# Patient Record
Sex: Female | Born: 1940 | Race: Black or African American | Hispanic: No | Marital: Married | Smoking: Never smoker
Health system: Southern US, Community
[De-identification: ages and names within clinical notes are randomized; demographics above are authoritative.]

## PROBLEM LIST (undated history)

## (undated) DIAGNOSIS — E7800 Pure hypercholesterolemia, unspecified: Secondary | ICD-10-CM

## (undated) DIAGNOSIS — K219 Gastro-esophageal reflux disease without esophagitis: Secondary | ICD-10-CM

## (undated) DIAGNOSIS — M1990 Unspecified osteoarthritis, unspecified site: Secondary | ICD-10-CM

## (undated) DIAGNOSIS — H409 Unspecified glaucoma: Secondary | ICD-10-CM

## (undated) DIAGNOSIS — I10 Essential (primary) hypertension: Secondary | ICD-10-CM

## (undated) DIAGNOSIS — C50919 Malignant neoplasm of unspecified site of unspecified female breast: Secondary | ICD-10-CM

## (undated) HISTORY — PX: CHOLECYSTECTOMY: SHX55

## (undated) HISTORY — PX: ABDOMINAL HYSTERECTOMY: SHX81

---

## 1972-10-31 HISTORY — PX: TUBAL LIGATION: SHX77

## 1998-02-11 ENCOUNTER — Ambulatory Visit (HOSPITAL_COMMUNITY): Admission: RE | Admit: 1998-02-11 | Discharge: 1998-02-11 | Payer: Self-pay | Admitting: *Deleted

## 1998-03-19 ENCOUNTER — Inpatient Hospital Stay (HOSPITAL_COMMUNITY): Admission: EM | Admit: 1998-03-19 | Discharge: 1998-03-22 | Payer: Self-pay | Admitting: Emergency Medicine

## 1998-04-24 ENCOUNTER — Ambulatory Visit: Admission: RE | Admit: 1998-04-24 | Discharge: 1998-04-24 | Payer: Self-pay | Admitting: *Deleted

## 1998-06-10 ENCOUNTER — Other Ambulatory Visit: Admission: RE | Admit: 1998-06-10 | Discharge: 1998-06-10 | Payer: Self-pay | Admitting: Gynecology

## 1998-06-24 ENCOUNTER — Other Ambulatory Visit: Admission: RE | Admit: 1998-06-24 | Discharge: 1998-06-24 | Payer: Self-pay | Admitting: Gynecology

## 1998-09-15 ENCOUNTER — Ambulatory Visit (HOSPITAL_COMMUNITY): Admission: RE | Admit: 1998-09-15 | Discharge: 1998-09-15 | Payer: Self-pay | Admitting: *Deleted

## 1999-01-12 ENCOUNTER — Ambulatory Visit (HOSPITAL_COMMUNITY): Admission: RE | Admit: 1999-01-12 | Discharge: 1999-01-12 | Payer: Self-pay | Admitting: *Deleted

## 1999-01-12 ENCOUNTER — Encounter: Payer: Self-pay | Admitting: *Deleted

## 1999-05-03 ENCOUNTER — Ambulatory Visit (HOSPITAL_COMMUNITY): Admission: RE | Admit: 1999-05-03 | Discharge: 1999-05-03 | Payer: Self-pay | Admitting: *Deleted

## 1999-05-03 ENCOUNTER — Encounter: Payer: Self-pay | Admitting: *Deleted

## 1999-05-21 ENCOUNTER — Other Ambulatory Visit: Admission: RE | Admit: 1999-05-21 | Discharge: 1999-05-21 | Payer: Self-pay | Admitting: Gynecology

## 1999-05-21 ENCOUNTER — Encounter (INDEPENDENT_AMBULATORY_CARE_PROVIDER_SITE_OTHER): Payer: Self-pay | Admitting: Specialist

## 1999-07-06 ENCOUNTER — Other Ambulatory Visit: Admission: RE | Admit: 1999-07-06 | Discharge: 1999-07-06 | Payer: Self-pay | Admitting: Gynecology

## 1999-07-13 ENCOUNTER — Inpatient Hospital Stay (HOSPITAL_COMMUNITY): Admission: RE | Admit: 1999-07-13 | Discharge: 1999-07-14 | Payer: Self-pay | Admitting: Gynecology

## 2000-05-09 ENCOUNTER — Encounter: Payer: Self-pay | Admitting: *Deleted

## 2000-05-09 ENCOUNTER — Ambulatory Visit (HOSPITAL_COMMUNITY): Admission: RE | Admit: 2000-05-09 | Discharge: 2000-05-09 | Payer: Self-pay | Admitting: *Deleted

## 2000-07-07 ENCOUNTER — Ambulatory Visit (HOSPITAL_COMMUNITY): Admission: RE | Admit: 2000-07-07 | Discharge: 2000-07-07 | Payer: Self-pay | Admitting: *Deleted

## 2000-11-16 ENCOUNTER — Encounter: Payer: Self-pay | Admitting: *Deleted

## 2000-11-16 ENCOUNTER — Inpatient Hospital Stay (HOSPITAL_COMMUNITY): Admission: AD | Admit: 2000-11-16 | Discharge: 2000-11-19 | Payer: Self-pay | Admitting: *Deleted

## 2000-11-18 ENCOUNTER — Encounter: Payer: Self-pay | Admitting: *Deleted

## 2000-11-30 ENCOUNTER — Emergency Department (HOSPITAL_COMMUNITY): Admission: EM | Admit: 2000-11-30 | Discharge: 2000-11-30 | Payer: Self-pay

## 2000-12-12 ENCOUNTER — Ambulatory Visit (HOSPITAL_COMMUNITY): Admission: RE | Admit: 2000-12-12 | Discharge: 2000-12-12 | Payer: Self-pay | Admitting: Cardiology

## 2001-03-12 ENCOUNTER — Encounter: Payer: Self-pay | Admitting: Emergency Medicine

## 2001-03-12 ENCOUNTER — Emergency Department (HOSPITAL_COMMUNITY): Admission: EM | Admit: 2001-03-12 | Discharge: 2001-03-12 | Payer: Self-pay | Admitting: Emergency Medicine

## 2001-03-19 ENCOUNTER — Ambulatory Visit (HOSPITAL_COMMUNITY): Admission: RE | Admit: 2001-03-19 | Discharge: 2001-03-19 | Payer: Self-pay | Admitting: *Deleted

## 2001-03-19 ENCOUNTER — Encounter (INDEPENDENT_AMBULATORY_CARE_PROVIDER_SITE_OTHER): Payer: Self-pay | Admitting: Specialist

## 2001-05-10 ENCOUNTER — Encounter: Payer: Self-pay | Admitting: *Deleted

## 2001-05-10 ENCOUNTER — Ambulatory Visit (HOSPITAL_COMMUNITY): Admission: RE | Admit: 2001-05-10 | Discharge: 2001-05-10 | Payer: Self-pay | Admitting: *Deleted

## 2001-05-25 ENCOUNTER — Ambulatory Visit (HOSPITAL_COMMUNITY): Admission: RE | Admit: 2001-05-25 | Discharge: 2001-05-25 | Payer: Self-pay | Admitting: *Deleted

## 2001-10-11 ENCOUNTER — Other Ambulatory Visit: Admission: RE | Admit: 2001-10-11 | Discharge: 2001-10-11 | Payer: Self-pay | Admitting: Gynecology

## 2001-12-03 ENCOUNTER — Ambulatory Visit (HOSPITAL_COMMUNITY): Admission: RE | Admit: 2001-12-03 | Discharge: 2001-12-03 | Payer: Self-pay | Admitting: *Deleted

## 2001-12-10 ENCOUNTER — Ambulatory Visit (HOSPITAL_COMMUNITY): Admission: RE | Admit: 2001-12-10 | Discharge: 2001-12-10 | Payer: Self-pay | Admitting: *Deleted

## 2001-12-11 ENCOUNTER — Ambulatory Visit (HOSPITAL_COMMUNITY): Admission: RE | Admit: 2001-12-11 | Discharge: 2001-12-11 | Payer: Self-pay | Admitting: *Deleted

## 2002-05-13 ENCOUNTER — Encounter: Payer: Self-pay | Admitting: *Deleted

## 2002-05-13 ENCOUNTER — Ambulatory Visit (HOSPITAL_COMMUNITY): Admission: RE | Admit: 2002-05-13 | Discharge: 2002-05-13 | Payer: Self-pay | Admitting: *Deleted

## 2002-05-20 ENCOUNTER — Emergency Department (HOSPITAL_COMMUNITY): Admission: EM | Admit: 2002-05-20 | Discharge: 2002-05-20 | Payer: Self-pay | Admitting: Emergency Medicine

## 2002-06-06 ENCOUNTER — Encounter: Admission: RE | Admit: 2002-06-06 | Discharge: 2002-06-06 | Payer: Self-pay | Admitting: *Deleted

## 2002-06-06 ENCOUNTER — Encounter: Payer: Self-pay | Admitting: *Deleted

## 2002-10-17 ENCOUNTER — Other Ambulatory Visit: Admission: RE | Admit: 2002-10-17 | Discharge: 2002-10-17 | Payer: Self-pay | Admitting: Gynecology

## 2003-05-15 ENCOUNTER — Encounter: Payer: Self-pay | Admitting: Family Medicine

## 2003-05-15 ENCOUNTER — Ambulatory Visit (HOSPITAL_COMMUNITY): Admission: RE | Admit: 2003-05-15 | Discharge: 2003-05-15 | Payer: Self-pay | Admitting: Family Medicine

## 2003-09-22 ENCOUNTER — Other Ambulatory Visit: Admission: RE | Admit: 2003-09-22 | Discharge: 2003-09-22 | Payer: Self-pay | Admitting: Gynecology

## 2004-05-17 ENCOUNTER — Ambulatory Visit (HOSPITAL_COMMUNITY): Admission: RE | Admit: 2004-05-17 | Discharge: 2004-05-17 | Payer: Self-pay | Admitting: Family Medicine

## 2004-10-04 ENCOUNTER — Other Ambulatory Visit: Admission: RE | Admit: 2004-10-04 | Discharge: 2004-10-04 | Payer: Self-pay | Admitting: Gynecology

## 2004-11-12 ENCOUNTER — Encounter: Admission: RE | Admit: 2004-11-12 | Discharge: 2004-11-12 | Payer: Self-pay | Admitting: Family Medicine

## 2005-05-18 ENCOUNTER — Ambulatory Visit (HOSPITAL_COMMUNITY): Admission: RE | Admit: 2005-05-18 | Discharge: 2005-05-18 | Payer: Self-pay | Admitting: Family Medicine

## 2006-02-08 ENCOUNTER — Other Ambulatory Visit: Admission: RE | Admit: 2006-02-08 | Discharge: 2006-02-08 | Payer: Self-pay | Admitting: Gynecology

## 2006-05-25 ENCOUNTER — Ambulatory Visit (HOSPITAL_COMMUNITY): Admission: RE | Admit: 2006-05-25 | Discharge: 2006-05-25 | Payer: Self-pay | Admitting: Family Medicine

## 2006-06-14 ENCOUNTER — Encounter: Admission: RE | Admit: 2006-06-14 | Discharge: 2006-06-14 | Payer: Self-pay | Admitting: Family Medicine

## 2007-05-28 ENCOUNTER — Ambulatory Visit (HOSPITAL_COMMUNITY): Admission: RE | Admit: 2007-05-28 | Discharge: 2007-05-28 | Payer: Self-pay | Admitting: Family Medicine

## 2007-05-31 ENCOUNTER — Other Ambulatory Visit: Admission: RE | Admit: 2007-05-31 | Discharge: 2007-05-31 | Payer: Self-pay | Admitting: Gynecology

## 2008-06-11 ENCOUNTER — Ambulatory Visit (HOSPITAL_COMMUNITY): Admission: RE | Admit: 2008-06-11 | Discharge: 2008-06-11 | Payer: Self-pay | Admitting: Family Medicine

## 2008-12-31 ENCOUNTER — Encounter (HOSPITAL_COMMUNITY): Admission: RE | Admit: 2008-12-31 | Discharge: 2009-03-31 | Payer: Self-pay | Admitting: Cardiology

## 2009-06-15 ENCOUNTER — Ambulatory Visit (HOSPITAL_COMMUNITY): Admission: RE | Admit: 2009-06-15 | Discharge: 2009-06-15 | Payer: Self-pay | Admitting: Gynecology

## 2009-06-25 ENCOUNTER — Encounter: Admission: RE | Admit: 2009-06-25 | Discharge: 2009-06-25 | Payer: Self-pay | Admitting: Family Medicine

## 2010-06-16 ENCOUNTER — Ambulatory Visit (HOSPITAL_COMMUNITY): Admission: RE | Admit: 2010-06-16 | Discharge: 2010-06-16 | Payer: Self-pay | Admitting: Family Medicine

## 2011-03-18 NOTE — Discharge Summary (Signed)
[place]. [facility]  Patient:    Carmen Fischer, Carmen Fischer                          MRN: [id number] Adm. Date:  [id number] Disc. Date: [id number] Attending:  Sharyn Dross CC:         Eduardo Osier. Sharyn Lull, M.D.   Discharge Summary  ADMISSION DIAGNOSES: 1. Chest pain. 2. Abnormal cardiac enzymes.  DISCHARGE DIAGNOSIS:  Noncardiac chest pain, resolved.  CONDITION AT THE TIME OF DISCHARGE:  Stable and improved.  DISCHARGE MEDICATIONS:  To resume home medications at this time.  Additional medications may be added per Eduardo Osier. Sharyn Lull, M.D.  Junius ArgyleEduardo Osier. Sharyn Lull, M.D., cardiologist.  FOLLOW-UP:  Follow up with me in one week and follow up with [facility]. Sharyn Lull, M.D., as deemed necessary.  PROCEDURES:  On November 18, 2000, Persantine Cardiolite study.  The results were negative.  HOSPITAL COURSE:  The patient was admitted into the hospital after I was called in the morning time of a troponin level in the panic elevated level. She came into the hospital without any discomforts or pains that were present. Her symptoms initially were right-sided chest pains radiating down her right arm, but this event had occurred three weeks ago in no and had only lasted 24 hours.  IVs were started at this time and a cardiology consultation was arranged for the evaluation.  Based on her age and asymptomatology, a Persantine Cardiolite and stress test were performed.  She tolerated the studies well without any complications and the results of the stress appeared to be normal.  The ______ report on the Cardiolite study also appeared to be unremarkable.  The patient is presently going to be discharged to home today.  She is to follow up with me at a scheduled appointment in one week.  She is to follow up with the cardiologist at an appropriate time for any follow-up that may be necessary.  She will be continued with her medications and the possibilities of the Lopressor may be  continued during this time.  CLINICAL CONDITION:  Stable and improved. DD:  11/19/00 TD:  11/20/00 Job: [id number]

## 2011-03-18 NOTE — Procedures (Signed)
[place]. [facility]  Patient:    Carmen Fischer, Carmen Fischer                          MRN: [id number] Proc. Date: 03/19/01 Adm. Date:  [id number] Attending:  Sharyn Dross                           Procedure Report  PREOPERATIVE DIAGNOSIS:  Noncardiac chest pain.  POSTOPERATIVE DIAGNOSES: 1. Gastric ulcer. 2. Duodenitis with bleeding. 3. 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  PROCEDURE:  Esophagogastroduodenoscopy with biopsies.  MEDICATIONS:  Demerol 50 mg IV, Versed 3 mg IV over a 10-minute period of time.  INSTRUMENT:  Olympus video panendoscope.  ENDOSCOPIST:  Sharyn Dross., M.D.  INDICATION:  This pleasant 70 year old female was brought in for endoscopic examination after having two episodes of chest pains and _____ that was noted.  The initial episode approximately one month ago had occurred, and the patient was seen by a cardiologist.  She was admitted into the hospital, where he evaluated the patient at that time.  Conservative management after stress thallium study performed that was noted.  The patient subsequently had recurrent chest discomforts and presented back to the emergency room, where she underwent evaluation and cardiac catheterization that wass noted.  She wa relatively stable up until one week ago, when the patient had recurrence of chest discomforts and was evaluated by the cardiologist again.  She was subsequently referred back to me when the results of the studies again were normal.  She was advised to have an endoscopic examination to rule out evidence of esophageal disease or any gastric process that may be a contributing factor to her problem.  OBJECTIVE FINDINGS:  She was a pleasant female in no major distress at this time.  VITAL SIGNS:  Stable.  HEENT:  Anicteric.  NECK:  Supple.  LUNGS:  Clear.  HEART:  A regular rate and rhythm without heaves, thrills, murmurs, or gallops.  ABDOMEN:  Soft.  There was no tenderness,  no hepatosplenomegaly noted.  EXTREMITIES:  Within normal limits.  PLAN:  We are going to proceed with the endoscopic examination.  INFORMED CONSENT:  The patient was advised of the procedure, indications, and the risks involved.  She has agreed to have the procedure performed.  A video was reviewed and a consent form obtained.  PREOPERATIVE PREPARATION:  The patient is brought in the endoscopy room, where an IV sedating medication was started.  A monitor was placed on the patient to monitor the patients vital signs and oxygen saturation.  Nasal oxygen at 2 L/min. was used and after adequate sedation was performed, the procedure was begun.  DESCRIPTION OF PROCEDURE:  The instrument was advanced with the patient lying in the left lateral position via direct technique without difficulty.  The oropharyngeal, epiglottis, vocal cords, and piriform sinuses appeared to be grossly within normal limits.  The esophagus was normal without any evidence of acute inflammation, ulcerations, hiatal hernias, or varices appreciated.  The gastric area showed a normal mucous lake without any evidence of skin inflammation, ulcerations in the gastric body that was noted or in the antral area that was appreciated, or in the fundus region that was appreciated. However, as the instrument approached the antral area, there was evidence of an ulceration that was noted at this time.  There was a surrounding edema with the ulcer, but the tissue was soft  in the area.  Multiple biopsies were obtained of this region at this time.  The pylorus is normal and upon advancing into the pyloric area, the duodenal bulb showed evidence of duodenitis that was noted.  The mucosal tissue was also very friable at this time, and the patient had oozing of blood that was present, which gradually resolved.  The instrument was subsequently retracted back, where a retroflex view of the cardia showed evidence of a polypoid lesion  that was appreciated.  A biopsy was taken of this lesion, as well as photographing at this time.  The results of these biopsies were sent for an analysis.  The Z-line appeared to be approximately 38 cm distal esophagus. The instrument was subsequently retracted back and removed _____ without difficulty, where the patient tolerated the procedure well.  TREATMENT: 1. We will await the results of the biopsy reports. 2. Start the patient on a PPI such as Prevacid 30 mg q.d. 3. Repeat the procedure in approximately four to six weeks at this time, and    depending on the results will determine the course of therapy. DD:  03/19/01 TD:  03/19/01 Job: [id number]

## 2011-03-18 NOTE — H&P (Signed)
[place]. [facility]  Patient:    Carmen Fischer, Carmen Fischer                          MRN: [id number] Adm. Date:  [id number] Attending:  Sharyn Dross                         History and Physical  HISTORY OF PRESENT ILLNESS:  This pleasant 70 year old black female was admitted into the hospital for abnormal cardiac enzymes.  Her symptoms started approximately three weeks ago, and on one day she had symptoms of chest pain which appeared on the anterior wall on the right side of her chest.  The pains radiated down her right arm at this time, and were associated with a diaphoretic episode that was present.  The symptoms presented themselves, and lasted for the course of the full day, and subsequently after the first 24 hours, the pains abated.  she subsequently came into the office one day prior to admission, and explained the experience to me.  She has had no major discomfort or any further chest pain since that time.  There was no previous history noted of any cardiovascular disease that is noted.  PHYSICAL EXAMINATION:  GENERAL:  She is a very pleasant female, appearing to be in no acute distress.  VITAL SIGNS:  Stable.  HEENT:  Appears to be anicteric.  Arcus senilis noted.  NECK:  Supple.  LUNGS:  Clear to auscultation and percussion.  HEART:  A regular rate and rhythm, without heaves, thrills, murmurs, or gallops.  ABDOMEN:  Soft, no tenderness, no hepatosplenomegaly appreciated.  EXTREMITIES:  Appeared to be within normal limits.  LABORATORY DATA:  Showed the cardiac enzymes to be elevated to the panic level with a troponin I level at this time.  This was called to me by [facility] at 5 a.m. this morning.  GROSS IMPRESSION: 1. Abnormal cardiac enzymes. 2. History of chest pain, rule out a possible myocardial infarction,    versus myocardial ischemia.  RECOMMENDATIONS: 1. I am going to admit the patient to 23-hour observation. 2. Cardiac  consultation. 3. A 2-dimensional echocardiogram. 4. Cardiac enzymes at this time.  Depending upon these results will determine the course of therapy. DD:  11/16/00 TD:  11/17/00 Job: [id number]

## 2011-03-18 NOTE — Cardiovascular Report (Signed)
[place]. [facility]  Patient:    Carmen Fischer, Carmen Fischer                          MRN: [id number] Proc. Date: 12/12/00 Adm. Date:  [id number] Attending:  Robynn Pane CC:         Sharyn Dross., M.D.  Cardiac Catheterization Lab   Cardiac Catheterization  PROCEDURE:  Left cardiac catheterization with selective left and right coronary angiography. Left ventricular angiography via right groin using Judkins technique.  INDICATIONS FOR PROCEDURE:  Carmen Fischer is a 70 year old black female with past medical history significant for probable small non-Q-wave MI by positive troponin recently, history of hypercholesterolemia, positive family history of coronary artery disease, recently discharged from the hospital.  She came to the office complaining of right-sided chest pain grade 2 to 3/10 without associated symptoms.  The patient received 1 sublingual nitroglycerin spray with relief of chest pain in a few minutes.  Since hospital discharge, the pat was seen in the emergency room last week for right-sided chest pain radiating to the right arm, associated with diaphoresis and shortness of breath.  EKG and cardiac enzymes were negative, and she was discharged home.  The patient had EKG done in my office which showed normal sinus rhythm with minimal ST elevation in high lateral leads with no reciprocal changes.  The patient denies any PND, orthopnea, leg swelling, denies palpitations, lightheadedness, syncope.  She denies fever, chills, or cough.  She denies relation of chest pain to food or breathing.  PAST MEDICAL HISTORY:  As above.  PAST SURGICAL HISTORY:  She had hysterectomy last year.  She had cholecystectomy approximately three years ago.  ALLERGIES:  No known drug allergies.  MEDICATIONS: 1. Lipitor 10 mg p.o. q.d. 2. Allegra p.r.n. 3. Enteric-coated aspirin 1 tablet daily. 4. Nitro-Dur 0.2 mg per hour which was added today.  SOCIAL HISTORY:  She is  married, has two children.  No history of smoking or alcohol abuse.  She was born in [place], lived most of her life in [place], moved her 7 years ago, retired in 1992.  FAMILY HISTORY:  Father died of MI at the age of 44.  Mother died of MI at 95. One brother died of ______  complications at the age of 46.   One brother had CA of lung.  One brother died of CA of brain.  One sister had disease of the lung.  PHYSICAL EXAMINATION:  GENERAL:  She was alert, awake, oriented x 3, in no acute distress.  VITAL SIGNS:  Blood pressure 140/80, pulse 58 and regular.  HEENT:  Conjunctivae pink.  NECK:  Supple.  No JVD, no bruit.  LUNGS:  Clear to auscultation without rhonchi or rales.  CARDIOVASCULAR:  S1 and S2 normal.  There was no S3 gallop.  ABDOMEN:  Soft.  Bowel sounds present, nontender.  EXTREMITIES:  There is no clubbing, cyanosis, or edema.  IMPRESSION: 1. Recurrent chest pain, probably anginal equivalent.  Rule out coronary    insufficiency. 2. History of recent small non-Q-wave myocardial infarction. 3. Hypercholesterolemia. 4. Positive family history of coronary artery disease.  PLAN:  Discussed with the patient and her husband regarding options of treatment; i.e., medical versus invasive.  Discussed risks such as death, MI, stroke, need for emergency CABG, risk of restenosis, vascular complications, etc.  The patient consented for the invasive procedure.  DESCRIPTION OF PROCEDURE:  After obtaining informed consent, the patient  was brought to the catheterization lab and was placed on fluoroscopy table.  The right groin was prepped and draped in usual fashion.  Xylocaine 2% was used for local anesthesia in the right groin.  With the help of a thin-walled needle, a 6-French arterial sheath was placed.  The sheath was aspirated and flushed.  Next, a 6-French left Judkins catheter was advanced over the wire under fluoroscopic guidance up to the ascending aorta.  The  wire was pulled out.  The catheter was aspirated and connected to the manifold.  The catheter was further advanced and engaged into the left coronary ostium.  Multiple views of the left system were taken.  Next, the catheter was disengaged and was pulled out over the wire and was replaced with 6-French right Judkins catheter which was advanced over the wire under fluoroscopic guidance up to the ascending aorta.  The wire was pulled out.  The catheter was aspirated and connected to the manifold.  The catheter was further advanced and engaged into the right coronary ostium.  Multiple views of the right system were taken. Next the catheter was disengaged and was pulled out over the wire and was replaced with a 6-French pigtail catheter which was advanced over the wire under fluoroscopic guidance up to the ascending aorta.  The wire was pulled out.  The catheter was aspirated and connected to the manifold.  The catheter was further advanced across the aortic valve into the LV.  LV pressures were recorded.  Next, the LV angiography was done in the 30-degree RAO position. Post-angiographic pressures are recorded from LV, and then pullback pressures were recorded from the aorta.  There was no gradient across the aortic valve. Next, the pigtail catheter was pulled out over the wire.  The sheathes were aspirated and flushed.  Arteriotomy was closed with Perclose without any complications.  ANGIOGRAPHY FINDINGS:  The patient has good LV systolic function. Apical lateral wall showed hypokinesia, EF 55 to 60%.  The left main was patent.  LAD was patent.  Diagonal #1 was small which was patent.  Diagonal #2 was medium size which was patent.  Diagonal #3 was very, very small which appeared diffusely diseased.  Ramus was medium sized which bifurcates into two branches in mid portion, superior branches very tortuous but patent.  The left circumflex is patent. RCA is patent.  The patient has full dominant  coronary  system.  The patient tolerated the procedure well.  PLAN:  Continue with present medication.  The patient will be discharged home this afternoon if hemodynamically stable. DD:  12/12/00 TD:  12/12/00 Job: [id number]

## 2011-12-20 DIAGNOSIS — R05 Cough: Secondary | ICD-10-CM | POA: Diagnosis not present

## 2011-12-20 DIAGNOSIS — R071 Chest pain on breathing: Secondary | ICD-10-CM | POA: Diagnosis not present

## 2012-02-07 DIAGNOSIS — Z1211 Encounter for screening for malignant neoplasm of colon: Secondary | ICD-10-CM | POA: Diagnosis not present

## 2012-02-07 DIAGNOSIS — E7800 Pure hypercholesterolemia, unspecified: Secondary | ICD-10-CM | POA: Diagnosis not present

## 2012-02-07 DIAGNOSIS — K219 Gastro-esophageal reflux disease without esophagitis: Secondary | ICD-10-CM | POA: Diagnosis not present

## 2012-02-07 DIAGNOSIS — K5900 Constipation, unspecified: Secondary | ICD-10-CM | POA: Diagnosis not present

## 2012-02-07 DIAGNOSIS — Z79899 Other long term (current) drug therapy: Secondary | ICD-10-CM | POA: Diagnosis not present

## 2012-02-07 DIAGNOSIS — I10 Essential (primary) hypertension: Secondary | ICD-10-CM | POA: Diagnosis not present

## 2012-02-07 DIAGNOSIS — J301 Allergic rhinitis due to pollen: Secondary | ICD-10-CM | POA: Diagnosis not present

## 2012-02-28 DIAGNOSIS — H9190 Unspecified hearing loss, unspecified ear: Secondary | ICD-10-CM | POA: Diagnosis not present

## 2012-03-28 DIAGNOSIS — H4011X0 Primary open-angle glaucoma, stage unspecified: Secondary | ICD-10-CM | POA: Diagnosis not present

## 2012-03-28 DIAGNOSIS — H409 Unspecified glaucoma: Secondary | ICD-10-CM | POA: Diagnosis not present

## 2012-04-24 DIAGNOSIS — Z1211 Encounter for screening for malignant neoplasm of colon: Secondary | ICD-10-CM | POA: Diagnosis not present

## 2012-06-20 DIAGNOSIS — Z1231 Encounter for screening mammogram for malignant neoplasm of breast: Secondary | ICD-10-CM | POA: Diagnosis not present

## 2012-06-20 DIAGNOSIS — Z124 Encounter for screening for malignant neoplasm of cervix: Secondary | ICD-10-CM | POA: Diagnosis not present

## 2012-06-25 ENCOUNTER — Other Ambulatory Visit: Payer: Self-pay | Admitting: Gynecology

## 2012-06-25 DIAGNOSIS — R928 Other abnormal and inconclusive findings on diagnostic imaging of breast: Secondary | ICD-10-CM

## 2012-06-27 ENCOUNTER — Ambulatory Visit
Admission: RE | Admit: 2012-06-27 | Discharge: 2012-06-27 | Disposition: A | Discharge status: Home / Self Care | Payer: Medicare Other | Source: Ambulatory Visit | Attending: Gynecology | Admitting: Gynecology

## 2012-06-27 DIAGNOSIS — R928 Other abnormal and inconclusive findings on diagnostic imaging of breast: Secondary | ICD-10-CM

## 2012-07-11 DIAGNOSIS — H9190 Unspecified hearing loss, unspecified ear: Secondary | ICD-10-CM | POA: Diagnosis not present

## 2012-07-24 DIAGNOSIS — L259 Unspecified contact dermatitis, unspecified cause: Secondary | ICD-10-CM | POA: Diagnosis not present

## 2012-07-24 DIAGNOSIS — D869 Sarcoidosis, unspecified: Secondary | ICD-10-CM | POA: Diagnosis not present

## 2012-07-24 DIAGNOSIS — D485 Neoplasm of uncertain behavior of skin: Secondary | ICD-10-CM | POA: Diagnosis not present

## 2012-08-02 DIAGNOSIS — H4011X0 Primary open-angle glaucoma, stage unspecified: Secondary | ICD-10-CM | POA: Diagnosis not present

## 2012-08-02 DIAGNOSIS — H409 Unspecified glaucoma: Secondary | ICD-10-CM | POA: Diagnosis not present

## 2012-08-08 DIAGNOSIS — E7800 Pure hypercholesterolemia, unspecified: Secondary | ICD-10-CM | POA: Diagnosis not present

## 2012-08-08 DIAGNOSIS — Z23 Encounter for immunization: Secondary | ICD-10-CM | POA: Diagnosis not present

## 2012-08-08 DIAGNOSIS — Z79899 Other long term (current) drug therapy: Secondary | ICD-10-CM | POA: Diagnosis not present

## 2012-08-08 DIAGNOSIS — K219 Gastro-esophageal reflux disease without esophagitis: Secondary | ICD-10-CM | POA: Diagnosis not present

## 2012-08-08 DIAGNOSIS — K5900 Constipation, unspecified: Secondary | ICD-10-CM | POA: Diagnosis not present

## 2012-08-08 DIAGNOSIS — I10 Essential (primary) hypertension: Secondary | ICD-10-CM | POA: Diagnosis not present

## 2012-08-23 DIAGNOSIS — R42 Dizziness and giddiness: Secondary | ICD-10-CM | POA: Diagnosis not present

## 2012-11-08 DIAGNOSIS — H409 Unspecified glaucoma: Secondary | ICD-10-CM | POA: Diagnosis not present

## 2012-11-08 DIAGNOSIS — H4011X0 Primary open-angle glaucoma, stage unspecified: Secondary | ICD-10-CM | POA: Diagnosis not present

## 2012-12-22 ENCOUNTER — Observation Stay (HOSPITAL_COMMUNITY)
Admission: EM | Admit: 2012-12-22 | Discharge: 2012-12-23 | Disposition: A | Discharge status: Home / Self Care | Payer: Medicare Other | Attending: Internal Medicine | Admitting: Internal Medicine

## 2012-12-22 ENCOUNTER — Emergency Department (HOSPITAL_COMMUNITY): Payer: Medicare Other

## 2012-12-22 ENCOUNTER — Encounter (HOSPITAL_COMMUNITY): Payer: Self-pay | Admitting: Emergency Medicine

## 2012-12-22 DIAGNOSIS — E871 Hypo-osmolality and hyponatremia: Secondary | ICD-10-CM | POA: Diagnosis not present

## 2012-12-22 DIAGNOSIS — R072 Precordial pain: Secondary | ICD-10-CM | POA: Diagnosis not present

## 2012-12-22 DIAGNOSIS — Z79899 Other long term (current) drug therapy: Secondary | ICD-10-CM | POA: Diagnosis not present

## 2012-12-22 DIAGNOSIS — R079 Chest pain, unspecified: Secondary | ICD-10-CM | POA: Diagnosis not present

## 2012-12-22 DIAGNOSIS — Z7982 Long term (current) use of aspirin: Secondary | ICD-10-CM | POA: Diagnosis not present

## 2012-12-22 DIAGNOSIS — E7800 Pure hypercholesterolemia, unspecified: Secondary | ICD-10-CM | POA: Diagnosis not present

## 2012-12-22 DIAGNOSIS — R5381 Other malaise: Secondary | ICD-10-CM | POA: Diagnosis not present

## 2012-12-22 DIAGNOSIS — R5383 Other fatigue: Secondary | ICD-10-CM | POA: Diagnosis not present

## 2012-12-22 DIAGNOSIS — K219 Gastro-esophageal reflux disease without esophagitis: Secondary | ICD-10-CM | POA: Diagnosis present

## 2012-12-22 DIAGNOSIS — R42 Dizziness and giddiness: Secondary | ICD-10-CM | POA: Diagnosis not present

## 2012-12-22 DIAGNOSIS — Z8249 Family history of ischemic heart disease and other diseases of the circulatory system: Secondary | ICD-10-CM | POA: Insufficient documentation

## 2012-12-22 DIAGNOSIS — I10 Essential (primary) hypertension: Secondary | ICD-10-CM | POA: Diagnosis present

## 2012-12-22 DIAGNOSIS — I252 Old myocardial infarction: Secondary | ICD-10-CM | POA: Insufficient documentation

## 2012-12-22 HISTORY — DX: Pure hypercholesterolemia, unspecified: E78.00

## 2012-12-22 LAB — POCT I-STAT TROPONIN I: Troponin i, poc: 0 ng/mL (ref 0.00–0.08)

## 2012-12-22 LAB — CBC WITH DIFFERENTIAL/PLATELET
Basophils Absolute: 0 10*3/uL (ref 0.0–0.1)
Basophils Relative: 0 % (ref 0–1)
Lymphocytes Relative: 27 % (ref 12–46)
MCH: 28.8 pg (ref 26.0–34.0)
Neutro Abs: 3.2 10*3/uL (ref 1.7–7.7)
RBC: 4.68 MIL/uL (ref 3.87–5.11)
RDW: 14.4 % (ref 11.5–15.5)

## 2012-12-22 LAB — COMPREHENSIVE METABOLIC PANEL
AST: 32 U/L (ref 0–37)
Albumin: 3.8 g/dL (ref 3.5–5.2)
Alkaline Phosphatase: 95 U/L (ref 39–117)
Calcium: 9.8 mg/dL (ref 8.4–10.5)
Chloride: 95 mEq/L — ABNORMAL LOW (ref 96–112)
GFR calc non Af Amer: 71 mL/min — ABNORMAL LOW (ref >90)
Glucose, Bld: 89 mg/dL (ref 70–99)
Potassium: 3.6 mEq/L (ref 3.5–5.1)
Sodium: 134 mEq/L — ABNORMAL LOW (ref 135–145)

## 2012-12-22 MED ORDER — ASPIRIN EC 325 MG PO TBEC
325.0000 mg | DELAYED_RELEASE_TABLET | Freq: Every day | ORAL | Status: DC
  Administered 2012-12-22 – 2012-12-23 (×2): 325 mg via ORAL
  Filled 2012-12-22 (×2): qty 1

## 2012-12-22 MED ORDER — NITROGLYCERIN 0.4 MG SL SUBL: 0.4000 mg | SUBLINGUAL_TABLET | SUBLINGUAL | Status: DC | PRN

## 2012-12-22 MED ORDER — ENOXAPARIN SODIUM 40 MG/0.4ML ~~LOC~~ SOLN
40.0000 mg | SUBCUTANEOUS | Status: DC
  Administered 2012-12-22: 40 mg via SUBCUTANEOUS
  Filled 2012-12-22 (×2): qty 0.4

## 2012-12-22 MED ORDER — TRIAMTERENE-HCTZ 37.5-25 MG PO TABS: 1.0000 | ORAL_TABLET | Freq: Every day | ORAL | Status: DC

## 2012-12-22 MED ORDER — LORATADINE 10 MG PO TABS
10.0000 mg | ORAL_TABLET | Freq: Every day | ORAL | Status: DC
  Administered 2012-12-23: 10 mg via ORAL
  Filled 2012-12-22: qty 1

## 2012-12-22 MED ORDER — PANTOPRAZOLE SODIUM 40 MG PO TBEC
40.0000 mg | DELAYED_RELEASE_TABLET | Freq: Every day | ORAL | Status: DC
  Administered 2012-12-22 – 2012-12-23 (×2): 40 mg via ORAL
  Filled 2012-12-22 (×2): qty 1

## 2012-12-22 MED ORDER — SIMVASTATIN 20 MG PO TABS
20.0000 mg | ORAL_TABLET | Freq: Every day | ORAL | Status: DC
  Administered 2012-12-22: 20 mg via ORAL
  Filled 2012-12-22 (×2): qty 1

## 2012-12-22 MED ORDER — MORPHINE SULFATE 2 MG/ML IJ SOLN: 1.0000 mg | INTRAMUSCULAR | Status: DC | PRN

## 2012-12-22 MED ORDER — TRAVOPROST (BAK FREE) 0.004 % OP SOLN
1.0000 [drp] | Freq: Every day | OPHTHALMIC | Status: DC
  Administered 2012-12-22: 1 [drp] via OPHTHALMIC
  Filled 2012-12-22: qty 2.5

## 2012-12-22 MED ORDER — ACETAMINOPHEN 650 MG RE SUPP: 650.0000 mg | Freq: Four times a day (QID) | RECTAL | Status: DC | PRN

## 2012-12-22 MED ORDER — ACETAMINOPHEN 325 MG PO TABS: 650.0000 mg | ORAL_TABLET | Freq: Four times a day (QID) | ORAL | Status: DC | PRN

## 2012-12-22 MED ORDER — AMLODIPINE BESYLATE 5 MG PO TABS
5.0000 mg | ORAL_TABLET | Freq: Every day | ORAL | Status: DC
  Administered 2012-12-22 – 2012-12-23 (×2): 5 mg via ORAL
  Filled 2012-12-22 (×2): qty 1

## 2012-12-22 MED ORDER — ONDANSETRON HCL 4 MG/2ML IJ SOLN: 4.0000 mg | Freq: Four times a day (QID) | INTRAMUSCULAR | Status: DC | PRN

## 2012-12-22 MED ORDER — ONDANSETRON HCL 4 MG PO TABS: 4.0000 mg | ORAL_TABLET | Freq: Four times a day (QID) | ORAL | Status: DC | PRN

## 2012-12-22 MED ORDER — NITROGLYCERIN 0.3 MG SL SUBL
0.3000 mg | SUBLINGUAL_TABLET | SUBLINGUAL | Status: DC | PRN
  Filled 2012-12-22: qty 100

## 2012-12-22 NOTE — ED Provider Notes (Signed)
History     CSN: [id number]  Arrival date & time 12/22/12  1148   First MD Initiated Contact with Patient 12/22/12 1235      No chief complaint on file.   (Consider location/radiation/quality/duration/timing/severity/associated sxs/prior treatment) HPI Comments: Patient with a hx of HLD presents for squeezing pressure sensation in her substernal chest x 2 days that is intermittent and waxing and waning in severity. Patient states the pain radiates to her b/l shoulders and is accompanied by weakness in her legs as well as a tightening sensation in her b/l jaw. Patient denies experiencing this pain at any point in the past. Further denies LOC, vision changes, tinnitus, shortness of breath, abdominal pain, N/V/D, and numbness or tingling in her extremities.  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 Is not currently being followed by cardiology.  Patient is a 72 y.o. female presenting with chest pain. The history is provided by the patient. No language interpreter was used.  Chest Pain Pain location:  Substernal area Pain radiates to:  L shoulder and R shoulder Pain radiates to the back: no   Pain severity:  Moderate Onset quality:  Sudden Progression:  Waxing and waning Chronicity:  New Relieved by:  Nothing Exacerbated by: nothing. Associated symptoms: weakness   Associated symptoms: no abdominal pain, no altered mental status, no back pain, no cough, no fever, no lower extremity edema, no nausea, no near-syncope, no numbness, no palpitations, no shortness of breath, no syncope and not vomiting   Risk factors: high cholesterol     Past Medical History  Diagnosis Date  . High cholesterol     Past Surgical History  Procedure Laterality Date  . Abdominal hysterectomy    . Cholecystectomy      Family History  Problem Relation Age of Onset  . Heart attack Mother   . Heart attack Father     History   Substance Use Topics  . Smoking status: Never Smoker   . Smokeless tobacco: Not on file  . Alcohol Use: No    OB History   Grav Para Term Preterm Abortions TAB SAB Ect Mult Living                  Review of Systems  Constitutional: Negative for fever and chills.  HENT: Negative for hearing loss, ear pain and tinnitus.   Eyes: Negative for photophobia and visual disturbance.  Respiratory: Negative for cough, chest tightness and shortness of breath.   Cardiovascular: Positive for chest pain. Negative for palpitations, leg swelling, syncope and near-syncope.       Substernal chest pain  Gastrointestinal: Negative for nausea, vomiting, abdominal pain and diarrhea.  Genitourinary: Negative.   Musculoskeletal: Negative for back pain.  Skin: Negative for color change.  Neurological: Positive for weakness and light-headedness. Negative for numbness.  Psychiatric/Behavioral: Negative for altered mental status.  All other systems reviewed and are negative.    Allergies  Shellfish allergy  Home Medications   No current outpatient prescriptions on file.  BP 143/78  Pulse 66  Temp(Src) 97.9 F (36.6 C) (Oral)  Resp 16  Ht 5\' 3"  (1.6 m)  Wt 166 lb 3.6 oz (75.4 kg)  BMI 29.45 kg/m2  SpO2 98%  Physical Exam  Nursing note and vitals reviewed. Constitutional: She is oriented to person, place, and time. She appears well-developed and well-nourished. No distress.  HENT:  Head: Normocephalic and atraumatic.  Mouth/Throat: Oropharynx is clear and  moist. No oropharyngeal exudate.  Eyes: Conjunctivae and EOM are normal. Pupils are equal, round, and reactive to light. No scleral icterus.  Neck: Normal range of motion. Neck supple.  Cardiovascular: Normal rate, regular rhythm, normal heart sounds and intact distal pulses.   Pulmonary/Chest: Effort normal and breath sounds normal. No respiratory distress. She has no wheezes. She has no rales.  Abdominal: Soft. She exhibits no  distension. There is no tenderness.  Musculoskeletal: Normal range of motion. She exhibits no edema and no tenderness.  Lymphadenopathy:    She has no cervical adenopathy.  Neurological: She is alert and oriented to person, place, and time. No cranial nerve deficit.  Skin: Skin is warm and dry. No rash noted. She is not diaphoretic. No erythema.  Psychiatric: She has a normal mood and affect. Her behavior is normal.    ED Course  Procedures (including critical care time)  Labs Reviewed  COMPREHENSIVE METABOLIC PANEL - Abnormal; Notable for the following:    Sodium 134 (*)    Chloride 95 (*)    CO2 33 (*)    GFR calc non Af Amer 71 (*)    GFR calc Af Amer 83 (*)    All other components within normal limits  CBC WITH DIFFERENTIAL  TROPONIN I  TROPONIN I  TROPONIN I  POCT I-STAT TROPONIN I   Dg Chest 2 View  12/22/2012  *RADIOLOGY REPORT*  Clinical Data: Chest pain for 2 days.  Nonsmoker.  CHEST - 2 VIEW  Comparison: 02/26/2010  Findings: Lateral view degraded by patient arm position.  Mid lower thoracic spondylosis.  Numerous leads and wires project over the chest.  Cholecystectomy clips. Midline trachea.  Normal heart size and mediastinal contours. No pleural effusion or pneumothorax.  Linear scarring within the right midlung is unchanged.  IMPRESSION: No acute cardiopulmonary disease.   Original Report Authenticated By: Jeronimo Greaves, M.D.      1. Chest pain   2. High cholesterol      MDM  Patient with HLD presents to ED for a squeezing pressure in her substernal chest x 2 days that is intermittent and waxing and waning in severity, lasting for ~10 minutes per episode; pain radiates to her b/l shoulders and is accompanied by weakness in her legs as well as a tightening sensation in her b/l jaw. Work up included labs, EKG and CXR all of which were unremarkable and showed no evidence of cardiac ischemia. Have discussed the patient's HPI and work up with Dr. Anitra Lauth. 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 Given patient's heart history, hx of HLD and description of symptoms, a consult has been put in to the hospitalist to discuss patient admission.  Dr. Anitra Lauth has consulted with the hospitalist who has agreed to admit this patient for further testing and evaluation.  Filed Vitals:   12/22/12 1256 12/22/12 1300 12/22/12 1430 12/22/12 1600  BP: 128/66 122/64 119/80 143/78  Pulse:  54 56 66  Temp:    97.9 F (36.6 C)  TempSrc:    Oral  Resp: 18 9 12 16   Height:    5\' 3"  (1.6 m)  Weight:    166 lb 3.6 oz (75.4 kg)  SpO2: 100% 100% 99% 98%           Antony Madura, PA-C 12/22/12 1654

## 2012-12-22 NOTE — Consult Note (Signed)
Reason for Consult: Recurrent chest pain Referring Physician: [facility]  Carmen Fischer is an 72 y.o. female.  HPI: Patient is 72 year old female with past medical history significant for hypertension, hypercholesteremia, GERD, positive family history of coronary artery disease father died of MI at age of 24, came to the ER complaining of retrosternal chest pain described as soreness grade 4-5/10 radiating to jaw and both shoulders without associated nausea vomiting diaphoresis. States soreness last for a few minutes relieved with rest also occasionally gets shortness after eating food. Denies any exertional chest pain. Denies any palpitation lightheadedness or syncope denies PND orthopnea next swelling. Due to recurrent soreness in the chest radiating to jaw so decided to come to ER. 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 Denies any cough fever chills. Denies any weakness in the arms or legs. Denies claudication pain.  Past Medical History  Diagnosis Date  . High cholesterol     Past Surgical History  Procedure Laterality Date  . Abdominal hysterectomy    . Cholecystectomy      Family History  Problem Relation Age of Onset  . Heart attack Mother   . Heart attack Father     Social History:  reports that she has never smoked. She does not have any smokeless tobacco history on file. She reports that she does not drink alcohol. Her drug history is not on file.  Allergies:  Allergies  Allergen Reactions  . Shellfish Allergy Anaphylaxis and Rash    Medications: I have reviewed the patient's current medications.  Results for orders placed during the hospital encounter of 12/22/12 (from the past 48 hour(s))  CBC WITH DIFFERENTIAL     Status: None   Collection Time    12/22/12 11:58 AM      Result Value Range   WBC 5.0  4.0 - 10.5 K/uL   RBC 4.68  3.87 - 5.11 MIL/uL   Hemoglobin 13.5   12.0 - 15.0 g/dL   HCT 11.9  14.7 - 82.9 %   MCV 83.3  78.0 - 100.0 fL   MCH 28.8  26.0 - 34.0 pg   MCHC 34.6  30.0 - 36.0 g/dL   RDW 56.2  13.0 - 86.5 %   Platelets 222  150 - 400 K/uL   Neutrophils Relative 64  43 - 77 %   Neutro Abs 3.2  1.7 - 7.7 K/uL   Lymphocytes Relative 27  12 - 46 %   Lymphs Abs 1.3  0.7 - 4.0 K/uL   Monocytes Relative 7  3 - 12 %   Monocytes Absolute 0.4  0.1 - 1.0 K/uL   Eosinophils Relative 2  0 - 5 %   Eosinophils Absolute 0.1  0.0 - 0.7 K/uL   Basophils Relative 0  0 - 1 %   Basophils Absolute 0.0  0.0 - 0.1 K/uL  COMPREHENSIVE METABOLIC PANEL     Status: Abnormal   Collection Time    12/22/12 11:58 AM      Result Value Range   Sodium 134 (*) 135 - 145 mEq/L   Potassium 3.6  3.5 - 5.1 mEq/L   Chloride 95 (*) 96 - 112 mEq/L   CO2 33 (*) 19 - 32 mEq/L   Glucose, Bld 89  70 - 99 mg/dL   BUN 12  6 - 23 mg/dL   Creatinine, Ser 7.84  0.50 - 1.10 mg/dL   Calcium 9.8  8.4 - 10.5 mg/dL   Total Protein 7.7  6.0 - 8.3 g/dL   Albumin 3.8  3.5 - 5.2 g/dL   AST 32  0 - 37 U/L   ALT 24  0 - 35 U/L   Alkaline Phosphatase 95  39 - 117 U/L   Total Bilirubin 0.4  0.3 - 1.2 mg/dL   GFR calc non Af Amer 71 (*) >90 mL/min   GFR calc Af Amer 83 (*) >90 mL/min   Comment:            The eGFR has been calculated     using the CKD EPI equation.     This calculation has not been     validated in all clinical     situations.     eGFR's persistently     <90 mL/min signify     possible Chronic Kidney Disease.  POCT I-STAT TROPONIN I     Status: None   Collection Time    12/22/12 12:17 PM      Result Value Range   Troponin i, poc 0.00  0.00 - 0.08 ng/mL   Comment 3            Comment: Due to the release kinetics of cTnI,     a negative result within the first hours     of the onset of symptoms does not rule out     myocardial infarction with certainty.     If myocardial infarction is still suspected,     repeat the test at appropriate intervals.  TROPONIN I      Status: None   Collection Time    12/22/12  3:41 PM      Result Value Range   Troponin I <0.30  <0.30 ng/mL   Comment:            Due to the release kinetics of cTnI,     a negative result within the first hours     of the onset of symptoms does not rule out     myocardial infarction with certainty.     If myocardial infarction is still suspected,     repeat the test at appropriate intervals.    Dg Chest 2 View  12/22/2012  *RADIOLOGY REPORT*  Clinical Data: Chest pain for 2 days.  Nonsmoker.  CHEST - 2 VIEW  Comparison: 02/26/2010  Findings: Lateral view degraded by patient arm position.  Mid lower thoracic spondylosis.  Numerous leads and wires project over the chest.  Cholecystectomy clips. Midline trachea.  Normal heart size and mediastinal contours. No pleural effusion or pneumothorax.  Linear scarring within the right midlung is unchanged.  IMPRESSION: No acute cardiopulmonary disease.   Original Report Authenticated By: Jeronimo Greaves, M.D.     Review of Systems  Constitutional: Negative for fever and chills.  Eyes: Negative for blurred vision and double vision.  Respiratory: Negative for cough, hemoptysis and sputum production.   Cardiovascular: Positive for chest pain and palpitations. Negative for orthopnea, claudication and leg swelling.  Gastrointestinal: Negative for nausea, vomiting and abdominal pain.  Neurological: Positive for dizziness. Negative for headaches.   Blood pressure 143/78, pulse 66, temperature 97.9 F (36.6 C), temperature source Oral, resp. rate 16, height 5\' 3"  (1.6 m), weight 75.4 kg (166 lb 3.6 oz), SpO2 98.00%. Physical Exam  Constitutional: She is oriented to person, place, and time. She appears well-developed and well-nourished.  HENT:  Head: Normocephalic and atraumatic.  Eyes: Conjunctivae are normal. Pupils are  equal, round, and reactive to light. Left eye exhibits no discharge. No scleral icterus.  Neck: Normal range of motion. Neck supple. No  JVD present. No tracheal deviation present.  Cardiovascular: Normal rate, regular rhythm and intact distal pulses.  Exam reveals friction rub. Exam reveals no gallop.   No murmur heard. Respiratory: Effort normal and breath sounds normal. No respiratory distress. She has no wheezes. She has no rales.  GI: Soft. Bowel sounds are normal. She exhibits no distension. There is no tenderness. There is no rebound.  Musculoskeletal: She exhibits no edema.  Neurological: She is alert and oriented to person, place, and time.    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 Hold Maxzide for now Add Norvasc as per orders Nitrostat sublingual when necessary Hold beta blockers in view of bradycardia Check old records  [facility] N 12/22/2012, 4:49 PM

## 2012-12-22 NOTE — H&P (Signed)
Patient's PCP: Thora Lance, MD  Chief Complaint: Chest pain  History of Present Illness: Carmen Fischer is a 72 y.o. African American female with history of hypertension, hypercholesterolemia, and GERD who presents with the above complaints.  She reports that her chest pain started 2 days ago.  Initially she had chest discomfort with pain radiating to both of her shoulders.  Chest pain was last for a few minutes before improving.  Over the last 2 days she has had constant discomfort with about 4-5 episodes daily of chest pain the last a few minutes.  With these episodes she had diaphoresis and pain to her shoulders.  She also had some jaw tightness.  Patient denies anything making it better or worse, pain comes on spontaneously.  Denies any pain running down the arm.  Denies shortness of breath.  Denies any recent fevers, chills, nausea, vomiting, abdominal pain, diarrhea, headaches or vision changes.  Due to her chest pain she presented to the emergency department for further evaluation, the hospitalist service was asked to admit the patient for further management.  Review of Systems: All systems reviewed with the patient and positive as per history of present illness, otherwise all other systems are negative.  Past Medical History  Diagnosis Date  . High cholesterol    History reviewed. No pertinent past surgical history. Family History  Problem Relation Age of Onset  . Heart attack Mother   . Heart attack Father    History   Social History  . Marital Status: Married    Spouse Name: N/A    Number of Children: N/A  . Years of Education: N/A   Occupational History  . Not on file.   Social History Main Topics  . Smoking status: Never Smoker   . Smokeless tobacco: Not on file  . Alcohol Use: No  . Drug Use: Not on file  . Sexually Active: Not on file   Other Topics Concern  . Not on file   Social History Narrative  . No narrative on file   Allergies: Shellfish  allergy  Home Meds: Prior to Admission medications   Medication Sig Start Date End Date Taking? Authorizing Provider  aspirin EC 81 MG tablet Take 81 mg by mouth daily.   Yes Historical Provider, MD  desloratadine (CLARINEX) 5 MG tablet Take 5 mg by mouth daily.   Yes Historical Provider, MD  Dexlansoprazole (DEXILANT PO) Take 1 capsule by mouth every evening.   Yes Historical Provider, MD  Simvastatin (ZOCOR PO) Take 1 tablet by mouth every evening.   Yes Historical Provider, MD  Travoprost, BAK Free, (TRAVATAN Z) 0.004 % SOLN ophthalmic solution Place 1 drop into both eyes at bedtime.   Yes Historical Provider, MD  triamterene-hydrochlorothiazide (MAXZIDE-25) 37.5-25 MG per tablet Take 1 tablet by mouth daily.   Yes Historical Provider, MD    Physical Exam: Blood pressure 119/80, pulse 56, temperature 97.6 F (36.4 C), resp. rate 12, SpO2 99.00%. General: Awake, Oriented x3, No acute distress. HEENT: EOMI, Moist mucous membranes Neck: Supple CV: S1 and S2 Lungs: Clear to ascultation bilaterally Abdomen: Soft, Nontender, Nondistended, +bowel sounds. Ext: Good pulses. Trace edema. No clubbing or cyanosis noted. Neuro: Cranial Nerves II-XII grossly intact. Has 5/5 motor strength in upper and lower extremities. Chest: Not reproducible on palpation.    Lab results:  Recent Labs  12/22/12 1158  NA 134*  K 3.6  CL 95*  CO2 33*  GLUCOSE 89  BUN 12  CREATININE 0.80  CALCIUM  9.8    Recent Labs  12/22/12 1158  AST 32  ALT 24  ALKPHOS 95  BILITOT 0.4  PROT 7.7  ALBUMIN 3.8   No results found for this basename: LIPASE, AMYLASE,  in the last 72 hours  Recent Labs  12/22/12 1158  WBC 5.0  NEUTROABS 3.2  HGB 13.5  HCT 39.0  MCV 83.3  PLT 222   No results found for this basename: CKTOTAL, CKMB, CKMBINDEX, TROPONINI,  in the last 72 hours No components found with this basename: POCBNP,  No results found for this basename: DDIMER,  in the last 72 hours No results  found for this basename: HGBA1C,  in the last 72 hours No results found for this basename: CHOL, HDL, LDLCALC, TRIG, CHOLHDL, LDLDIRECT,  in the last 72 hours No results found for this basename: TSH, T4TOTAL, FREET3, T3FREE, THYROIDAB,  in the last 72 hours No results found for this basename: VITAMINB12, FOLATE, FERRITIN, TIBC, IRON, RETICCTPCT,  in the last 72 hours Imaging results:  Dg Chest 2 View  12/22/2012  *RADIOLOGY REPORT*  Clinical Data: Chest pain for 2 days.  Nonsmoker.  CHEST - 2 VIEW  Comparison: 02/26/2010  Findings: Lateral view degraded by patient arm position.  Mid lower thoracic spondylosis.  Numerous leads and wires project over the chest.  Cholecystectomy clips. Midline trachea.  Normal heart size and mediastinal contours. No pleural effusion or pneumothorax.  Linear scarring within the right midlung is unchanged.  IMPRESSION: No acute cardiopulmonary disease.   Original Report Authenticated By: Jeronimo Greaves, M.D.    Other results: EKG:  Sinus bradycardia with heart rate of 57.  Assessment & Plan by Problem: Atypical chest pain  Admit the patient to telemetry, EKG not indicated for any active ischemia.  Cycle cardiac enzymes to rule the patient out for acute coronary syndrome.  Patient has had cardiac catheterization performed by Dr. Sharyn Lull on February of 2002.  Continue aspirin.  Given patient's age and risk factors, discussed with Dr. Sharyn Lull to see if patient wants further workup tomorrow if the patient is ruled out for acute coronary syndrome.  Hypertension Continue home antihypertensive medications.  Hyperlipidemia Continue statin.  GERD Continue PPI.  Mild hyponatremia Continue to monitor.  Prophylaxis Lovenox.  CODE STATUS Full code.  Disposition Admit the patient to telemetry as observation.  Time spent on admission, talking to the patient, and coordinating care was: 60 mins.  , A, MD 12/22/2012, 3:16 PM

## 2012-12-22 NOTE — ED Notes (Signed)
Sore and pressure on both side of shoulders has tightness in cheeks x 2 days  No n/v some weakness in legs

## 2012-12-23 ENCOUNTER — Observation Stay (HOSPITAL_COMMUNITY): Payer: Medicare Other

## 2012-12-23 DIAGNOSIS — E7800 Pure hypercholesterolemia, unspecified: Secondary | ICD-10-CM | POA: Diagnosis not present

## 2012-12-23 DIAGNOSIS — I10 Essential (primary) hypertension: Secondary | ICD-10-CM | POA: Diagnosis not present

## 2012-12-23 DIAGNOSIS — R079 Chest pain, unspecified: Secondary | ICD-10-CM | POA: Diagnosis not present

## 2012-12-23 DIAGNOSIS — K219 Gastro-esophageal reflux disease without esophagitis: Secondary | ICD-10-CM | POA: Diagnosis not present

## 2012-12-23 LAB — PROTIME-INR: INR: 1.09 (ref 0.00–1.49)

## 2012-12-23 LAB — CBC
MCV: 82.4 fL (ref 78.0–100.0)
Platelets: 207 10*3/uL (ref 150–400)
RBC: 4.61 MIL/uL (ref 3.87–5.11)
WBC: 3.4 10*3/uL — ABNORMAL LOW (ref 4.0–10.5)

## 2012-12-23 LAB — BASIC METABOLIC PANEL
CO2: 33 mEq/L — ABNORMAL HIGH (ref 19–32)
Calcium: 9.8 mg/dL (ref 8.4–10.5)
GFR calc non Af Amer: 61 mL/min — ABNORMAL LOW (ref >90)
Potassium: 3.7 mEq/L (ref 3.5–5.1)
Sodium: 138 mEq/L (ref 135–145)

## 2012-12-23 MED ORDER — TECHNETIUM TC 99M SESTAMIBI GENERIC - CARDIOLITE
30.0000 | Freq: Once | INTRAVENOUS | Status: AC | PRN
  Administered 2012-12-23: 30 via INTRAVENOUS

## 2012-12-23 MED ORDER — REGADENOSON 0.4 MG/5ML IV SOLN
INTRAVENOUS | Status: AC
  Administered 2012-12-23: 0.4 mg
  Filled 2012-12-23: qty 5

## 2012-12-23 MED ORDER — REGADENOSON 0.4 MG/5ML IV SOLN
0.4000 mg | Freq: Once | INTRAVENOUS | Status: DC
  Filled 2012-12-23: qty 5

## 2012-12-23 MED ORDER — TECHNETIUM TC 99M SESTAMIBI GENERIC - CARDIOLITE
10.0000 | Freq: Once | INTRAVENOUS | Status: AC | PRN
  Administered 2012-12-23: 10 via INTRAVENOUS

## 2012-12-23 NOTE — Progress Notes (Signed)
Utilization Review Completed.,  T2/23/2014  

## 2012-12-23 NOTE — ED Provider Notes (Signed)
Medical screening examination/treatment/procedure(s) were conducted as a shared visit with non-physician practitioner(s) and myself.  I personally evaluated the patient during the encounter Pt with atypical chest pain with dull pain that radiates to her shoulder.  Prior cath in 2002 with 3rd diagonal diseased but rest of CA wnl.  Pt initial labs wnl.  Currently CP free.  Will admit for obs  Gwyneth Sprout, MD 12/23/12 [phone]

## 2012-12-23 NOTE — Progress Notes (Signed)
Subjective:  Patient denies any chest pain or shortness of breath be seen in the nuclear medicine department today tolerated stress portion ok  Objective:  Vital Signs in the last 24 hours: Temp:  [97.6 F (36.4 C)-98.6 F (37 C)] 98.2 F (36.8 C) (02/23 0500) Pulse Rate:  [54-97] 87 (02/23 1145) Resp:  [9-64] 16 (02/23 0500) BP: (118-151)/(49-124) 125/55 mmHg (02/23 1145) SpO2:  [94 %-100 %] 94 % (02/23 0500) Weight:  [75.4 kg (166 lb 3.6 oz)-76.476 kg (168 lb 9.6 oz)] 76.476 kg (168 lb 9.6 oz) (02/23 0500)  Intake/Output from previous day: 02/22 0701 - 02/23 0700 In: 480 [P.O.:480] Out: -  Intake/Output from this shift:    Physical Exam: Neck: no adenopathy, no carotid bruit, no JVD and supple, symmetrical, trachea midline Lungs: clear to auscultation bilaterally Heart: regular rate and rhythm, S1, S2 normal, no murmur, click, rub or gallop Abdomen: soft, non-tender; bowel sounds normal; no masses,  no organomegaly Extremities: extremities normal, atraumatic, no cyanosis or edema  Lab Results:  Recent Labs  12/22/12 1158 12/23/12 0540  WBC 5.0 3.4*  HGB 13.5 13.0  PLT 222 207    Recent Labs  12/22/12 1158 12/23/12 0540  NA 134* 138  K 3.6 3.7  CL 95* 99  CO2 33* 33*  GLUCOSE 89 109*  BUN 12 9  CREATININE 0.80 0.91    Recent Labs  12/22/12 2116 12/23/12 0540  TROPONINI <0.30 <0.30   Hepatic Function Panel  Recent Labs  12/22/12 1158  PROT 7.7  ALBUMIN 3.8  AST 32  ALT 24  ALKPHOS 95  BILITOT 0.4   No results found for this basename: CHOL,  in the last 72 hours No results found for this basename: PROTIME,  in the last 72 hours  Imaging: Imaging results have been reviewed and Dg Chest 2 View  12/22/2012  *RADIOLOGY REPORT*  Clinical Data: Chest pain for 2 days.  Nonsmoker.  CHEST - 2 VIEW  Comparison: 02/26/2010  Findings: Lateral view degraded by patient arm position.  Mid lower thoracic spondylosis.  Numerous leads and wires project over  the chest.  Cholecystectomy clips. Midline trachea.  Normal heart size and mediastinal contours. No pleural effusion or pneumothorax.  Linear scarring within the right midlung is unchanged.  IMPRESSION: No acute cardiopulmonary disease.   Original Report Authenticated By: Jeronimo Greaves, M.D.     Cardiac Studies:  Assessment/Plan:  Status post Recurrent chest pain MI ruled out History of very small non-Q-wave myocardial infarction in the past  Hypertension  Hypercholesteremia  Positive family history of coronary artery disease  GERD Plan Continue present management Okay to discharge from cardiac point of view if stress test is negative for ischemia  LOS: 1 day    , N 12/23/2012, 11:51 AM

## 2012-12-23 NOTE — Progress Notes (Signed)
[facility] PROGRESS NOTE  Carmen Fischer ZOX:[id number] DOB: 1941-02-08 DOA: 12/22/2012 PCP: Thora Lance, MD  Assessment/Plan:  Atypical chest pain  Admit the patient to telemetry, EKG not indicated for any active ischemia. Cycle cardiac enzymes to rule the patient out for acute coronary syndrome. Patient has had cardiac catheterization performed by Dr. Sharyn Lull on February of 2002. Continue aspirin. Given patient's age and risk factors, discussed with Dr. Sharyn Lull , plan for stress test  today.   Hypertension  Continue home antihypertensive medications.   Hyperlipidemia  Continue statin.   GERD  Continue PPI.   Mild hyponatremia  Continue to monitor.  Prophylaxis  Lovenox.   Code Status: full code Family Communication: none at bedside Disposition Plan: possibly in am.   Consultants:  cardiology  Procedures:  Stress test today  HPI/Subjective: Chest pain improved.   Objective: Filed Vitals:   12/23/12 1141 12/23/12 1142 12/23/12 1144 12/23/12 1145  BP: 132/60 138/49 122/49 125/55  Pulse: 97 91 86 87  Temp:      TempSrc:      Resp:      Height:      Weight:      SpO2:        Intake/Output Summary (Last 24 hours) at 12/23/12 1246 Last data filed at 12/22/12 1700  Gross per 24 hour  Intake    480 ml  Output      0 ml  Net    480 ml   Filed Weights   12/22/12 1600 12/23/12 0500  Weight: 75.4 kg (166 lb 3.6 oz) 76.476 kg (168 lb 9.6 oz)    Exam: General: Awake, Oriented x3, No acute distress. CV: S1 and S2  Lungs: Clear to ascultation bilaterally  Abdomen: Soft, Nontender, Nondistended, +bowel sounds.  Ext: Good pulses. Trace edema. No clubbing or cyanosis noted.  Neuro: Cranial Nerves II-XII grossly intact. Has 5/5 motor strength in upper and lower extremities.   Data Reviewed: Basic Metabolic Panel:  Recent Labs Lab 12/22/12 1158 12/23/12 0540  NA 134* 138  K 3.6 3.7  CL 95* 99  CO2 33* 33*  GLUCOSE 89 109*  BUN 12 9   CREATININE 0.80 0.91  CALCIUM 9.8 9.8   Liver Function Tests:  Recent Labs Lab 12/22/12 1158  AST 32  ALT 24  ALKPHOS 95  BILITOT 0.4  PROT 7.7  ALBUMIN 3.8   No results found for this basename: LIPASE, AMYLASE,  in the last 168 hours No results found for this basename: AMMONIA,  in the last 168 hours CBC:  Recent Labs Lab 12/22/12 1158 12/23/12 0540  WBC 5.0 3.4*  NEUTROABS 3.2  --   HGB 13.5 13.0  HCT 39.0 38.0  MCV 83.3 82.4  PLT 222 207   Cardiac Enzymes:  Recent Labs Lab 12/22/12 1541 12/22/12 2116 12/23/12 0540  TROPONINI <0.30 <0.30 <0.30   BNP (last 3 results) No results found for this basename: PROBNP,  in the last 8760 hours CBG: No results found for this basename: GLUCAP,  in the last 168 hours  No results found for this or any previous visit (from the past 240 hour(s)).   Studies: Dg Chest 2 View  12/22/2012  *RADIOLOGY REPORT*  Clinical Data: Chest pain for 2 days.  Nonsmoker.  CHEST - 2 VIEW  Comparison: 02/26/2010  Findings: Lateral view degraded by patient arm position.  Mid lower thoracic spondylosis.  Numerous leads and wires project over the chest.  Cholecystectomy clips. Midline trachea.  Normal heart size  and mediastinal contours. No pleural effusion or pneumothorax.  Linear scarring within the right midlung is unchanged.  IMPRESSION: No acute cardiopulmonary disease.   Original Report Authenticated By: Jeronimo Greaves, M.D.     Scheduled Meds: . amLODipine  5 mg Oral Daily  . aspirin EC  325 mg Oral Daily  . enoxaparin (LOVENOX) injection  40 mg Subcutaneous Q24H  . loratadine  10 mg Oral Daily  . pantoprazole  40 mg Oral Daily  . regadenoson  0.4 mg Intravenous Once  . simvastatin  20 mg Oral q1800  . Travoprost (BAK Free)  1 drop Both Eyes QHS   Continuous Infusions:   Principal Problem:   Chest pain Active Problems:   High cholesterol   GERD (gastroesophageal reflux disease)   Hypertension     ,  [facility] Pager [phone] If 8PM-8AM, please contact night-coverage at [email or web address], password [facility] 12/23/2012, 12:46 PM  LOS: 1 day

## 2012-12-25 DIAGNOSIS — R079 Chest pain, unspecified: Secondary | ICD-10-CM | POA: Diagnosis not present

## 2012-12-26 NOTE — Discharge Summary (Signed)
Physician Discharge Summary  Carmen Fischer BJY:[id number] DOB: Oct 31, 1941 DOA: 12/22/2012  PCP: Thora Lance, MD  Admit date: 12/22/2012 Discharge date: 12/26/2012    Recommendations for Outpatient Follow-up:  1. Follow up with PCP as needed.   Discharge Diagnoses:  Principal Problem:   Chest pain Active Problems:   High cholesterol   GERD (gastroesophageal reflux disease)   Hypertension   Discharge Condition: stable.   Diet recommendation: low sodium diet  Filed Weights   12/22/12 1600 12/23/12 0500  Weight: 75.4 kg (166 lb 3.6 oz) 76.476 kg (168 lb 9.6 oz)    History of present illness:  Carmen Fischer is a 72 y.o. African American female with history of hypertension, hypercholesterolemia, and GERD who presents with the above complaints. She reports that her chest pain started 2 days ago. Initially she had chest discomfort with pain radiating to both of her shoulders. Chest pain was last for a few minutes before improving. Over the last 2 days she has had constant discomfort with about 4-5 episodes daily of chest pain the last a few minutes. With these episodes she had diaphoresis and pain to her shoulders. She also had some jaw tightness. Patient denies anything making it better or worse, pain comes on spontaneously. Denies any pain running down the arm. Denies shortness of breath. Denies any recent fevers, chills, nausea, vomiting, abdominal pain, diarrhea, headaches or vision changes. Due to her chest pain she presented to the emergency department for further evaluation, the hospitalist service was asked to admit the patient for further management.      Hospital Course:  Atypical chest pain  Admitted the patient to telemetry, EKG not indicated for any active ischemia.  cardiac enzymes are negative and she is ruled the patient out for acute coronary syndrome. Patient has had cardiac catheterization performed by Dr. Sharyn Lull on February of 2002. Continue aspirin. Given patient's  age and risk factors, discussed with Dr. Sharyn Lull , plan for stress test , and it came back negative for ischemia . She is being discharged home to follow up with PCP as needed. Her chest pain completely resolved.  Hypertension  Continue home antihypertensive medications.  Hyperlipidemia  Continue statin.  GERD  Continue PPI.  Mild hyponatremia  Continue to monitor.   Procedures:  Nuclear stress test Negative.   Consultations:  cardiology  Discharge Exam: Filed Vitals:   12/23/12 1144 12/23/12 1145 12/23/12 1312 12/23/12 1338  BP: 122/49 125/55 117/76 120/61  Pulse: 86 87  68  Temp:    98.3 F (36.8 C)  TempSrc:    Oral  Resp:    18  Height:      Weight:      SpO2:    99%   general: Awake, Oriented x3, No acute distress.  CV: S1 and S2  Lungs: Clear to ascultation bilaterally  Abdomen: Soft, Nontender, Nondistended, +bowel sounds.  Ext: Good pulses. Trace edema. No clubbing or cyanosis noted.  Neuro: Cranial Nerves II-XII grossly intact. Has 5/5 motor strength in upper and lower extremities.    Discharge Instructions  Discharge Orders   Future Orders Complete By Expires     Diet - low sodium heart healthy  As directed     Discharge instructions  As directed     Comments:      Follow up with PCP as needed.        Medication List    TAKE these medications       aspirin EC 81 MG tablet  Take  81 mg by mouth daily.     desloratadine 5 MG tablet  Commonly known as:  CLARINEX  Take 5 mg by mouth daily.     dexlansoprazole 60 MG capsule  Commonly known as:  DEXILANT  Take 60 mg by mouth every evening.     simvastatin 20 MG tablet  Commonly known as:  ZOCOR  Take 20 mg by mouth daily.     TRAVATAN Z 0.004 % Soln ophthalmic solution  Generic drug:  Travoprost (BAK Free)  Place 1 drop into both eyes at bedtime.     triamterene-hydrochlorothiazide 37.5-25 MG per tablet  Commonly known as:  MAXZIDE-25  Take 1 tablet by mouth daily.            Follow-up Information   Follow up with Thora Lance, MD In 1 week.   Contact information:   [address] [place] [address] [phone]        The results of significant diagnostics from this hospitalization (including imaging, microbiology, ancillary and laboratory) are listed below for reference.    Significant Diagnostic Studies: Dg Chest 2 View  12/22/2012  *RADIOLOGY REPORT*  Clinical Data: Chest pain for 2 days.  Nonsmoker.  CHEST - 2 VIEW  Comparison: 02/26/2010  Findings: Lateral view degraded by patient arm position.  Mid lower thoracic spondylosis.  Numerous leads and wires project over the chest.  Cholecystectomy clips. Midline trachea.  Normal heart size and mediastinal contours. No pleural effusion or pneumothorax.  Linear scarring within the right midlung is unchanged.  IMPRESSION: No acute cardiopulmonary disease.   Original Report Authenticated By: Jeronimo Greaves, M.D.    Nm Myocar Multi W/spect W/wall Motion / Ef  12/23/2012  *RADIOLOGY REPORT*  Clinical Data:  Chest pain.  MYOCARDIAL IMAGING WITH SPECT (REST AND PHARMACOLOGIC-STRESS) GATED LEFT VENTRICULAR WALL MOTION STUDY LEFT VENTRICULAR EJECTION FRACTION  Standard myocardial SPECT imaging was performed after resting intravenous injection of 10. Tc-6m tetrofosmin.  Subsequently, intravenous infusion of Lexiscan  was performed under the supervision of cardiology staff.  At peak effect of the drug, 30. of Tc-32m tetrofosmin was injected intravenously and standard myocardial SPECT imaging was performed.  Quantitative gated imaging was also performed to evaluate left ventricular wall motion and estimated left ventricular ejection fraction.  Comparison:  Plain films 12/22/2012.  Report of the exam of 11/18/2000.  Findings:  Rest images demonstrate normal left ventricular cavity size.  Possible apical thinning, mild.  Stress images demonstrate no areas of reversibility to suggest inducible ischemia.  Evaluation  of wall motion demonstrates normal left ventricular wall motion and thickening.  Ejection fraction is estimated at 78%.  End diastolic volume of 52 cc.  End systolic volume of  12 cc.  IMPRESSION: 1.  No fixed or reversible defects to suggest inducible ischemia. 2.  Normal left ventricular wall motion and thickening. 3.  Normal ejection fraction.   Original Report Authenticated By: Jeronimo Greaves, M.D.     Microbiology: No results found for this or any previous visit (from the past 240 hour(s)).   Labs: Basic Metabolic Panel:  Recent Labs Lab 12/22/12 1158 12/23/12 0540  NA 134* 138  K 3.6 3.7  CL 95* 99  CO2 33* 33*  GLUCOSE 89 109*  BUN 12 9  CREATININE 0.80 0.91  CALCIUM 9.8 9.8   Liver Function Tests:  Recent Labs Lab 12/22/12 1158  AST 32  ALT 24  ALKPHOS 95  BILITOT 0.4  PROT 7.7  ALBUMIN 3.8   No results  found for this basename: LIPASE, AMYLASE,  in the last 168 hours No results found for this basename: AMMONIA,  in the last 168 hours CBC:  Recent Labs Lab 12/22/12 1158 12/23/12 0540  WBC 5.0 3.4*  NEUTROABS 3.2  --   HGB 13.5 13.0  HCT 39.0 38.0  MCV 83.3 82.4  PLT 222 207   Cardiac Enzymes:  Recent Labs Lab 12/22/12 1541 12/22/12 2116 12/23/12 0540  TROPONINI <0.30 <0.30 <0.30   BNP: BNP (last 3 results) No results found for this basename: PROBNP,  in the last 8760 hours CBG: No results found for this basename: GLUCAP,  in the last 168 hours     Signed:  ,  [facility] 12/26/2012, 2:45 PM

## 2013-01-02 DIAGNOSIS — I10 Essential (primary) hypertension: Secondary | ICD-10-CM | POA: Diagnosis not present

## 2013-01-02 DIAGNOSIS — E7800 Pure hypercholesterolemia, unspecified: Secondary | ICD-10-CM | POA: Diagnosis not present

## 2013-01-02 DIAGNOSIS — K219 Gastro-esophageal reflux disease without esophagitis: Secondary | ICD-10-CM | POA: Diagnosis not present

## 2013-01-02 DIAGNOSIS — R0789 Other chest pain: Secondary | ICD-10-CM | POA: Diagnosis not present

## 2013-02-06 DIAGNOSIS — Z79899 Other long term (current) drug therapy: Secondary | ICD-10-CM | POA: Diagnosis not present

## 2013-02-06 DIAGNOSIS — E7800 Pure hypercholesterolemia, unspecified: Secondary | ICD-10-CM | POA: Diagnosis not present

## 2013-02-06 DIAGNOSIS — K5900 Constipation, unspecified: Secondary | ICD-10-CM | POA: Diagnosis not present

## 2013-02-06 DIAGNOSIS — I10 Essential (primary) hypertension: Secondary | ICD-10-CM | POA: Diagnosis not present

## 2013-02-06 DIAGNOSIS — K219 Gastro-esophageal reflux disease without esophagitis: Secondary | ICD-10-CM | POA: Diagnosis not present

## 2013-03-21 DIAGNOSIS — H4011X0 Primary open-angle glaucoma, stage unspecified: Secondary | ICD-10-CM | POA: Diagnosis not present

## 2013-03-21 DIAGNOSIS — H409 Unspecified glaucoma: Secondary | ICD-10-CM | POA: Diagnosis not present

## 2013-05-28 DIAGNOSIS — H9209 Otalgia, unspecified ear: Secondary | ICD-10-CM | POA: Diagnosis not present

## 2013-06-17 DIAGNOSIS — M79609 Pain in unspecified limb: Secondary | ICD-10-CM | POA: Diagnosis not present

## 2013-07-08 DIAGNOSIS — M25539 Pain in unspecified wrist: Secondary | ICD-10-CM | POA: Diagnosis not present

## 2013-07-08 DIAGNOSIS — I10 Essential (primary) hypertension: Secondary | ICD-10-CM | POA: Diagnosis not present

## 2013-07-15 DIAGNOSIS — M19049 Primary osteoarthritis, unspecified hand: Secondary | ICD-10-CM | POA: Diagnosis not present

## 2013-07-15 DIAGNOSIS — M654 Radial styloid tenosynovitis [de Quervain]: Secondary | ICD-10-CM | POA: Diagnosis not present

## 2013-07-25 DIAGNOSIS — H4011X0 Primary open-angle glaucoma, stage unspecified: Secondary | ICD-10-CM | POA: Diagnosis not present

## 2013-07-25 DIAGNOSIS — H409 Unspecified glaucoma: Secondary | ICD-10-CM | POA: Diagnosis not present

## 2013-07-25 DIAGNOSIS — H2510 Age-related nuclear cataract, unspecified eye: Secondary | ICD-10-CM | POA: Diagnosis not present

## 2013-08-02 DIAGNOSIS — M654 Radial styloid tenosynovitis [de Quervain]: Secondary | ICD-10-CM | POA: Diagnosis not present

## 2013-08-02 DIAGNOSIS — M19049 Primary osteoarthritis, unspecified hand: Secondary | ICD-10-CM | POA: Diagnosis not present

## 2013-08-07 DIAGNOSIS — Z124 Encounter for screening for malignant neoplasm of cervix: Secondary | ICD-10-CM | POA: Diagnosis not present

## 2013-08-07 DIAGNOSIS — Z1231 Encounter for screening mammogram for malignant neoplasm of breast: Secondary | ICD-10-CM | POA: Diagnosis not present

## 2013-08-07 DIAGNOSIS — Z1212 Encounter for screening for malignant neoplasm of rectum: Secondary | ICD-10-CM | POA: Diagnosis not present

## 2013-08-07 DIAGNOSIS — M899 Disorder of bone, unspecified: Secondary | ICD-10-CM | POA: Diagnosis not present

## 2013-08-14 DIAGNOSIS — E7800 Pure hypercholesterolemia, unspecified: Secondary | ICD-10-CM | POA: Diagnosis not present

## 2013-08-14 DIAGNOSIS — I10 Essential (primary) hypertension: Secondary | ICD-10-CM | POA: Diagnosis not present

## 2013-08-14 DIAGNOSIS — Z1331 Encounter for screening for depression: Secondary | ICD-10-CM | POA: Diagnosis not present

## 2013-08-14 DIAGNOSIS — Z79899 Other long term (current) drug therapy: Secondary | ICD-10-CM | POA: Diagnosis not present

## 2013-08-14 DIAGNOSIS — K5900 Constipation, unspecified: Secondary | ICD-10-CM | POA: Diagnosis not present

## 2013-08-14 DIAGNOSIS — Z23 Encounter for immunization: Secondary | ICD-10-CM | POA: Diagnosis not present

## 2013-08-14 DIAGNOSIS — K219 Gastro-esophageal reflux disease without esophagitis: Secondary | ICD-10-CM | POA: Diagnosis not present

## 2013-09-13 DIAGNOSIS — M654 Radial styloid tenosynovitis [de Quervain]: Secondary | ICD-10-CM | POA: Diagnosis not present

## 2013-09-13 DIAGNOSIS — M19049 Primary osteoarthritis, unspecified hand: Secondary | ICD-10-CM | POA: Diagnosis not present

## 2013-11-20 DIAGNOSIS — M654 Radial styloid tenosynovitis [de Quervain]: Secondary | ICD-10-CM | POA: Diagnosis not present

## 2013-11-28 DIAGNOSIS — H4011X0 Primary open-angle glaucoma, stage unspecified: Secondary | ICD-10-CM | POA: Diagnosis not present

## 2013-11-28 DIAGNOSIS — H2510 Age-related nuclear cataract, unspecified eye: Secondary | ICD-10-CM | POA: Diagnosis not present

## 2013-11-28 DIAGNOSIS — H409 Unspecified glaucoma: Secondary | ICD-10-CM | POA: Diagnosis not present

## 2014-01-08 DIAGNOSIS — M19049 Primary osteoarthritis, unspecified hand: Secondary | ICD-10-CM | POA: Diagnosis not present

## 2014-02-06 DIAGNOSIS — I10 Essential (primary) hypertension: Secondary | ICD-10-CM | POA: Diagnosis not present

## 2014-02-06 DIAGNOSIS — E7800 Pure hypercholesterolemia, unspecified: Secondary | ICD-10-CM | POA: Diagnosis not present

## 2014-02-06 DIAGNOSIS — Z79899 Other long term (current) drug therapy: Secondary | ICD-10-CM | POA: Diagnosis not present

## 2014-02-06 DIAGNOSIS — K5900 Constipation, unspecified: Secondary | ICD-10-CM | POA: Diagnosis not present

## 2014-02-06 DIAGNOSIS — K219 Gastro-esophageal reflux disease without esophagitis: Secondary | ICD-10-CM | POA: Diagnosis not present

## 2014-03-10 DIAGNOSIS — J069 Acute upper respiratory infection, unspecified: Secondary | ICD-10-CM | POA: Diagnosis not present

## 2014-03-26 ENCOUNTER — Encounter (INDEPENDENT_AMBULATORY_CARE_PROVIDER_SITE_OTHER): Payer: Self-pay

## 2014-03-26 ENCOUNTER — Ambulatory Visit
Admission: RE | Admit: 2014-03-26 | Discharge: 2014-03-26 | Disposition: A | Discharge status: Home / Self Care | Payer: Medicare Other | Source: Ambulatory Visit | Attending: Family Medicine | Admitting: Family Medicine

## 2014-03-26 ENCOUNTER — Other Ambulatory Visit: Payer: Self-pay | Admitting: Family Medicine

## 2014-03-26 DIAGNOSIS — R05 Cough: Secondary | ICD-10-CM

## 2014-03-26 DIAGNOSIS — R5381 Other malaise: Secondary | ICD-10-CM | POA: Diagnosis not present

## 2014-03-26 DIAGNOSIS — R059 Cough, unspecified: Secondary | ICD-10-CM

## 2014-03-26 DIAGNOSIS — R079 Chest pain, unspecified: Secondary | ICD-10-CM | POA: Diagnosis not present

## 2014-04-09 DIAGNOSIS — M654 Radial styloid tenosynovitis [de Quervain]: Secondary | ICD-10-CM | POA: Diagnosis not present

## 2014-04-09 DIAGNOSIS — M19049 Primary osteoarthritis, unspecified hand: Secondary | ICD-10-CM | POA: Diagnosis not present

## 2014-04-10 DIAGNOSIS — H409 Unspecified glaucoma: Secondary | ICD-10-CM | POA: Diagnosis not present

## 2014-04-10 DIAGNOSIS — H2510 Age-related nuclear cataract, unspecified eye: Secondary | ICD-10-CM | POA: Diagnosis not present

## 2014-04-10 DIAGNOSIS — H268 Other specified cataract: Secondary | ICD-10-CM | POA: Diagnosis not present

## 2014-05-28 DIAGNOSIS — L905 Scar conditions and fibrosis of skin: Secondary | ICD-10-CM | POA: Diagnosis not present

## 2014-07-17 DIAGNOSIS — H4011X0 Primary open-angle glaucoma, stage unspecified: Secondary | ICD-10-CM | POA: Diagnosis not present

## 2014-07-17 DIAGNOSIS — H409 Unspecified glaucoma: Secondary | ICD-10-CM | POA: Diagnosis not present

## 2014-07-17 DIAGNOSIS — H2510 Age-related nuclear cataract, unspecified eye: Secondary | ICD-10-CM | POA: Diagnosis not present

## 2014-07-28 DIAGNOSIS — H9209 Otalgia, unspecified ear: Secondary | ICD-10-CM | POA: Diagnosis not present

## 2014-08-08 DIAGNOSIS — Z23 Encounter for immunization: Secondary | ICD-10-CM | POA: Diagnosis not present

## 2014-08-08 DIAGNOSIS — K219 Gastro-esophageal reflux disease without esophagitis: Secondary | ICD-10-CM | POA: Diagnosis not present

## 2014-08-08 DIAGNOSIS — H409 Unspecified glaucoma: Secondary | ICD-10-CM | POA: Diagnosis not present

## 2014-08-08 DIAGNOSIS — E780 Pure hypercholesterolemia: Secondary | ICD-10-CM | POA: Diagnosis not present

## 2014-08-08 DIAGNOSIS — H9202 Otalgia, left ear: Secondary | ICD-10-CM | POA: Diagnosis not present

## 2014-08-08 DIAGNOSIS — I10 Essential (primary) hypertension: Secondary | ICD-10-CM | POA: Diagnosis not present

## 2014-08-08 DIAGNOSIS — J301 Allergic rhinitis due to pollen: Secondary | ICD-10-CM | POA: Diagnosis not present

## 2014-08-08 DIAGNOSIS — K5900 Constipation, unspecified: Secondary | ICD-10-CM | POA: Diagnosis not present

## 2014-08-13 DIAGNOSIS — H6122 Impacted cerumen, left ear: Secondary | ICD-10-CM | POA: Diagnosis not present

## 2014-08-13 DIAGNOSIS — H9312 Tinnitus, left ear: Secondary | ICD-10-CM | POA: Diagnosis not present

## 2014-08-28 DIAGNOSIS — Z1231 Encounter for screening mammogram for malignant neoplasm of breast: Secondary | ICD-10-CM | POA: Diagnosis not present

## 2014-08-28 DIAGNOSIS — Z1212 Encounter for screening for malignant neoplasm of rectum: Secondary | ICD-10-CM | POA: Diagnosis not present

## 2014-08-28 DIAGNOSIS — Z01419 Encounter for gynecological examination (general) (routine) without abnormal findings: Secondary | ICD-10-CM | POA: Diagnosis not present

## 2014-10-09 DIAGNOSIS — H2513 Age-related nuclear cataract, bilateral: Secondary | ICD-10-CM | POA: Diagnosis not present

## 2014-10-09 DIAGNOSIS — H4011X1 Primary open-angle glaucoma, mild stage: Secondary | ICD-10-CM | POA: Diagnosis not present

## 2014-10-29 DIAGNOSIS — R42 Dizziness and giddiness: Secondary | ICD-10-CM | POA: Diagnosis not present

## 2014-10-29 DIAGNOSIS — M791 Myalgia: Secondary | ICD-10-CM | POA: Diagnosis not present

## 2014-11-04 DIAGNOSIS — M79603 Pain in arm, unspecified: Secondary | ICD-10-CM | POA: Diagnosis not present

## 2014-11-04 DIAGNOSIS — R079 Chest pain, unspecified: Secondary | ICD-10-CM | POA: Diagnosis not present

## 2014-11-12 ENCOUNTER — Other Ambulatory Visit (HOSPITAL_COMMUNITY): Payer: Self-pay | Admitting: Cardiology

## 2014-11-12 DIAGNOSIS — I10 Essential (primary) hypertension: Secondary | ICD-10-CM | POA: Diagnosis not present

## 2014-11-12 DIAGNOSIS — R079 Chest pain, unspecified: Secondary | ICD-10-CM

## 2014-11-12 DIAGNOSIS — I252 Old myocardial infarction: Secondary | ICD-10-CM | POA: Diagnosis not present

## 2014-11-12 DIAGNOSIS — E785 Hyperlipidemia, unspecified: Secondary | ICD-10-CM | POA: Diagnosis not present

## 2014-11-12 DIAGNOSIS — K219 Gastro-esophageal reflux disease without esophagitis: Secondary | ICD-10-CM | POA: Diagnosis not present

## 2014-11-12 DIAGNOSIS — I2510 Atherosclerotic heart disease of native coronary artery without angina pectoris: Secondary | ICD-10-CM | POA: Diagnosis not present

## 2014-11-19 ENCOUNTER — Other Ambulatory Visit: Payer: Self-pay

## 2014-11-19 ENCOUNTER — Encounter (HOSPITAL_COMMUNITY)
Admission: RE | Admit: 2014-11-19 | Discharge: 2014-11-19 | Disposition: A | Discharge status: Home / Self Care | Payer: Medicare Other | Source: Ambulatory Visit | Attending: Cardiology | Admitting: Cardiology

## 2014-11-19 DIAGNOSIS — I10 Essential (primary) hypertension: Secondary | ICD-10-CM | POA: Diagnosis not present

## 2014-11-19 DIAGNOSIS — R079 Chest pain, unspecified: Secondary | ICD-10-CM | POA: Insufficient documentation

## 2014-11-19 DIAGNOSIS — I2510 Atherosclerotic heart disease of native coronary artery without angina pectoris: Secondary | ICD-10-CM | POA: Diagnosis not present

## 2014-11-19 DIAGNOSIS — I252 Old myocardial infarction: Secondary | ICD-10-CM | POA: Diagnosis not present

## 2014-11-19 MED ORDER — TECHNETIUM TC 99M SESTAMIBI GENERIC - CARDIOLITE
10.0000 | Freq: Once | INTRAVENOUS | Status: AC | PRN
  Administered 2014-11-19: 10 via INTRAVENOUS

## 2014-11-19 MED ORDER — REGADENOSON 0.4 MG/5ML IV SOLN
INTRAVENOUS | Status: AC
  Filled 2014-11-19: qty 5

## 2014-11-19 MED ORDER — REGADENOSON 0.4 MG/5ML IV SOLN
0.4000 mg | Freq: Once | INTRAVENOUS | Status: AC
  Administered 2014-11-19: 0.4 mg via INTRAVENOUS

## 2014-11-19 MED ORDER — TECHNETIUM TC 99M SESTAMIBI GENERIC - CARDIOLITE
30.0000 | Freq: Once | INTRAVENOUS | Status: AC | PRN
  Administered 2014-11-19: 30 via INTRAVENOUS

## 2015-02-09 DIAGNOSIS — K219 Gastro-esophageal reflux disease without esophagitis: Secondary | ICD-10-CM | POA: Diagnosis not present

## 2015-02-09 DIAGNOSIS — H409 Unspecified glaucoma: Secondary | ICD-10-CM | POA: Diagnosis not present

## 2015-02-09 DIAGNOSIS — I10 Essential (primary) hypertension: Secondary | ICD-10-CM | POA: Diagnosis not present

## 2015-02-09 DIAGNOSIS — B349 Viral infection, unspecified: Secondary | ICD-10-CM | POA: Diagnosis not present

## 2015-02-09 DIAGNOSIS — D4989 Neoplasm of unspecified behavior of other specified sites: Secondary | ICD-10-CM | POA: Diagnosis not present

## 2015-02-09 DIAGNOSIS — K5900 Constipation, unspecified: Secondary | ICD-10-CM | POA: Diagnosis not present

## 2015-02-09 DIAGNOSIS — E780 Pure hypercholesterolemia: Secondary | ICD-10-CM | POA: Diagnosis not present

## 2015-02-09 DIAGNOSIS — Z1389 Encounter for screening for other disorder: Secondary | ICD-10-CM | POA: Diagnosis not present

## 2015-03-04 DIAGNOSIS — E785 Hyperlipidemia, unspecified: Secondary | ICD-10-CM | POA: Diagnosis not present

## 2015-03-04 DIAGNOSIS — K219 Gastro-esophageal reflux disease without esophagitis: Secondary | ICD-10-CM | POA: Diagnosis not present

## 2015-03-04 DIAGNOSIS — I252 Old myocardial infarction: Secondary | ICD-10-CM | POA: Diagnosis not present

## 2015-03-04 DIAGNOSIS — I2510 Atherosclerotic heart disease of native coronary artery without angina pectoris: Secondary | ICD-10-CM | POA: Diagnosis not present

## 2015-03-04 DIAGNOSIS — I10 Essential (primary) hypertension: Secondary | ICD-10-CM | POA: Diagnosis not present

## 2015-03-09 DIAGNOSIS — H6983 Other specified disorders of Eustachian tube, bilateral: Secondary | ICD-10-CM | POA: Diagnosis not present

## 2015-03-09 DIAGNOSIS — H6122 Impacted cerumen, left ear: Secondary | ICD-10-CM | POA: Diagnosis not present

## 2015-03-19 DIAGNOSIS — H2513 Age-related nuclear cataract, bilateral: Secondary | ICD-10-CM | POA: Diagnosis not present

## 2015-03-19 DIAGNOSIS — H524 Presbyopia: Secondary | ICD-10-CM | POA: Diagnosis not present

## 2015-04-06 DIAGNOSIS — M2042 Other hammer toe(s) (acquired), left foot: Secondary | ICD-10-CM | POA: Diagnosis not present

## 2015-04-06 DIAGNOSIS — M2012 Hallux valgus (acquired), left foot: Secondary | ICD-10-CM | POA: Diagnosis not present

## 2015-04-06 DIAGNOSIS — L84 Corns and callosities: Secondary | ICD-10-CM | POA: Diagnosis not present

## 2015-04-14 DIAGNOSIS — L609 Nail disorder, unspecified: Secondary | ICD-10-CM | POA: Diagnosis not present

## 2015-04-17 DIAGNOSIS — H2512 Age-related nuclear cataract, left eye: Secondary | ICD-10-CM | POA: Diagnosis not present

## 2015-04-27 DIAGNOSIS — B351 Tinea unguium: Secondary | ICD-10-CM | POA: Diagnosis not present

## 2015-05-11 DIAGNOSIS — H2512 Age-related nuclear cataract, left eye: Secondary | ICD-10-CM | POA: Diagnosis not present

## 2015-05-11 DIAGNOSIS — H25812 Combined forms of age-related cataract, left eye: Secondary | ICD-10-CM | POA: Diagnosis not present

## 2015-06-11 DIAGNOSIS — H2511 Age-related nuclear cataract, right eye: Secondary | ICD-10-CM | POA: Diagnosis not present

## 2015-07-15 ENCOUNTER — Other Ambulatory Visit: Payer: Self-pay | Admitting: Family Medicine

## 2015-07-15 ENCOUNTER — Ambulatory Visit
Admission: RE | Admit: 2015-07-15 | Discharge: 2015-07-15 | Disposition: A | Discharge status: Home / Self Care | Payer: Medicare Other | Source: Ambulatory Visit | Attending: Family Medicine | Admitting: Family Medicine

## 2015-07-15 DIAGNOSIS — R109 Unspecified abdominal pain: Secondary | ICD-10-CM | POA: Diagnosis not present

## 2015-07-15 DIAGNOSIS — R140 Abdominal distension (gaseous): Secondary | ICD-10-CM

## 2015-07-15 DIAGNOSIS — K5900 Constipation, unspecified: Secondary | ICD-10-CM | POA: Diagnosis not present

## 2015-07-20 DIAGNOSIS — H2511 Age-related nuclear cataract, right eye: Secondary | ICD-10-CM | POA: Diagnosis not present

## 2015-07-20 DIAGNOSIS — H25811 Combined forms of age-related cataract, right eye: Secondary | ICD-10-CM | POA: Diagnosis not present

## 2015-08-11 DIAGNOSIS — E7800 Pure hypercholesterolemia, unspecified: Secondary | ICD-10-CM | POA: Diagnosis not present

## 2015-08-11 DIAGNOSIS — K219 Gastro-esophageal reflux disease without esophagitis: Secondary | ICD-10-CM | POA: Diagnosis not present

## 2015-08-11 DIAGNOSIS — H409 Unspecified glaucoma: Secondary | ICD-10-CM | POA: Diagnosis not present

## 2015-08-11 DIAGNOSIS — Z23 Encounter for immunization: Secondary | ICD-10-CM | POA: Diagnosis not present

## 2015-08-11 DIAGNOSIS — K5900 Constipation, unspecified: Secondary | ICD-10-CM | POA: Diagnosis not present

## 2015-08-11 DIAGNOSIS — I10 Essential (primary) hypertension: Secondary | ICD-10-CM | POA: Diagnosis not present

## 2015-08-24 DIAGNOSIS — K5900 Constipation, unspecified: Secondary | ICD-10-CM | POA: Diagnosis not present

## 2015-09-21 DIAGNOSIS — K5900 Constipation, unspecified: Secondary | ICD-10-CM | POA: Diagnosis not present

## 2015-11-19 DIAGNOSIS — H26492 Other secondary cataract, left eye: Secondary | ICD-10-CM | POA: Diagnosis not present

## 2015-11-19 DIAGNOSIS — H04123 Dry eye syndrome of bilateral lacrimal glands: Secondary | ICD-10-CM | POA: Diagnosis not present

## 2015-11-19 DIAGNOSIS — H524 Presbyopia: Secondary | ICD-10-CM | POA: Diagnosis not present

## 2015-11-19 DIAGNOSIS — H40013 Open angle with borderline findings, low risk, bilateral: Secondary | ICD-10-CM | POA: Diagnosis not present

## 2015-11-19 DIAGNOSIS — H26493 Other secondary cataract, bilateral: Secondary | ICD-10-CM | POA: Diagnosis not present

## 2015-11-24 DIAGNOSIS — R0981 Nasal congestion: Secondary | ICD-10-CM | POA: Diagnosis not present

## 2015-11-26 DIAGNOSIS — H26491 Other secondary cataract, right eye: Secondary | ICD-10-CM | POA: Diagnosis not present

## 2015-12-11 DIAGNOSIS — M79606 Pain in leg, unspecified: Secondary | ICD-10-CM | POA: Diagnosis not present

## 2015-12-11 DIAGNOSIS — R300 Dysuria: Secondary | ICD-10-CM | POA: Diagnosis not present

## 2016-02-02 DIAGNOSIS — Z1389 Encounter for screening for other disorder: Secondary | ICD-10-CM | POA: Diagnosis not present

## 2016-02-02 DIAGNOSIS — K5900 Constipation, unspecified: Secondary | ICD-10-CM | POA: Diagnosis not present

## 2016-02-02 DIAGNOSIS — H409 Unspecified glaucoma: Secondary | ICD-10-CM | POA: Diagnosis not present

## 2016-02-02 DIAGNOSIS — I10 Essential (primary) hypertension: Secondary | ICD-10-CM | POA: Diagnosis not present

## 2016-02-02 DIAGNOSIS — E7800 Pure hypercholesterolemia, unspecified: Secondary | ICD-10-CM | POA: Diagnosis not present

## 2016-02-02 DIAGNOSIS — K219 Gastro-esophageal reflux disease without esophagitis: Secondary | ICD-10-CM | POA: Diagnosis not present

## 2016-03-10 DIAGNOSIS — H524 Presbyopia: Secondary | ICD-10-CM | POA: Diagnosis not present

## 2016-03-10 DIAGNOSIS — H40013 Open angle with borderline findings, low risk, bilateral: Secondary | ICD-10-CM | POA: Diagnosis not present

## 2016-03-10 DIAGNOSIS — H04123 Dry eye syndrome of bilateral lacrimal glands: Secondary | ICD-10-CM | POA: Diagnosis not present

## 2016-03-10 DIAGNOSIS — H26493 Other secondary cataract, bilateral: Secondary | ICD-10-CM | POA: Diagnosis not present

## 2016-03-11 ENCOUNTER — Ambulatory Visit
Admission: RE | Admit: 2016-03-11 | Discharge: 2016-03-11 | Disposition: A | Discharge status: Home / Self Care | Payer: Medicare Other | Source: Ambulatory Visit | Attending: Gastroenterology | Admitting: Gastroenterology

## 2016-03-11 ENCOUNTER — Other Ambulatory Visit: Payer: Self-pay | Admitting: Gastroenterology

## 2016-03-11 DIAGNOSIS — K5900 Constipation, unspecified: Secondary | ICD-10-CM

## 2016-04-14 DIAGNOSIS — H04123 Dry eye syndrome of bilateral lacrimal glands: Secondary | ICD-10-CM | POA: Diagnosis not present

## 2016-04-14 DIAGNOSIS — H524 Presbyopia: Secondary | ICD-10-CM | POA: Diagnosis not present

## 2016-05-24 DIAGNOSIS — H04123 Dry eye syndrome of bilateral lacrimal glands: Secondary | ICD-10-CM | POA: Diagnosis not present

## 2016-05-24 DIAGNOSIS — H40013 Open angle with borderline findings, low risk, bilateral: Secondary | ICD-10-CM | POA: Diagnosis not present

## 2016-05-24 DIAGNOSIS — H5713 Ocular pain, bilateral: Secondary | ICD-10-CM | POA: Diagnosis not present

## 2016-06-11 ENCOUNTER — Encounter (HOSPITAL_COMMUNITY): Payer: Self-pay

## 2016-06-11 ENCOUNTER — Emergency Department (HOSPITAL_COMMUNITY)
Admission: EM | Admit: 2016-06-11 | Discharge: 2016-06-11 | Disposition: A | Discharge status: Home / Self Care | Payer: Medicare Other | Attending: Emergency Medicine | Admitting: Emergency Medicine

## 2016-06-11 ENCOUNTER — Emergency Department (HOSPITAL_COMMUNITY): Payer: Medicare Other

## 2016-06-11 DIAGNOSIS — E876 Hypokalemia: Secondary | ICD-10-CM

## 2016-06-11 DIAGNOSIS — R079 Chest pain, unspecified: Secondary | ICD-10-CM | POA: Diagnosis present

## 2016-06-11 DIAGNOSIS — R0789 Other chest pain: Secondary | ICD-10-CM | POA: Diagnosis not present

## 2016-06-11 DIAGNOSIS — Z7982 Long term (current) use of aspirin: Secondary | ICD-10-CM | POA: Insufficient documentation

## 2016-06-11 DIAGNOSIS — I10 Essential (primary) hypertension: Secondary | ICD-10-CM | POA: Diagnosis not present

## 2016-06-11 HISTORY — DX: Essential (primary) hypertension: I10

## 2016-06-11 HISTORY — DX: Unspecified glaucoma: H40.9

## 2016-06-11 LAB — BASIC METABOLIC PANEL
ANION GAP: 10 (ref 5–15)
BUN: 8 mg/dL (ref 6–20)
CO2: 27 mmol/L (ref 22–32)
Calcium: 9.3 mg/dL (ref 8.9–10.3)
Chloride: 97 mmol/L — ABNORMAL LOW (ref 101–111)
Creatinine, Ser: 0.89 mg/dL (ref 0.44–1.00)
GFR calc Af Amer: >60 mL/min (ref >60)
GFR calc non Af Amer: >60 mL/min (ref >60)
GLUCOSE: 127 mg/dL — AB (ref 65–99)
POTASSIUM: 2.9 mmol/L — AB (ref 3.5–5.1)
Sodium: 134 mmol/L — ABNORMAL LOW (ref 135–145)

## 2016-06-11 LAB — I-STAT TROPONIN, ED
Troponin i, poc: 0 ng/mL (ref 0.00–0.08)
Troponin i, poc: 0 ng/mL (ref 0.00–0.08)

## 2016-06-11 LAB — CBC
HEMATOCRIT: 39.5 % (ref 36.0–46.0)
HEMOGLOBIN: 13 g/dL (ref 12.0–15.0)
MCH: 27.4 pg (ref 26.0–34.0)
MCHC: 32.9 g/dL (ref 30.0–36.0)
MCV: 83.3 fL (ref 78.0–100.0)
Platelets: 247 10*3/uL (ref 150–400)
RBC: 4.74 MIL/uL (ref 3.87–5.11)
RDW: 14.6 % (ref 11.5–15.5)
WBC: 4.8 10*3/uL (ref 4.0–10.5)

## 2016-06-11 MED ORDER — POTASSIUM CHLORIDE CRYS ER 20 MEQ PO TBCR: EXTENDED_RELEASE_TABLET | ORAL | 0 refills | Status: DC

## 2016-06-11 MED ORDER — POTASSIUM CHLORIDE CRYS ER 20 MEQ PO TBCR
40.0000 meq | EXTENDED_RELEASE_TABLET | Freq: Once | ORAL | Status: AC
  Administered 2016-06-11: 40 meq via ORAL
  Filled 2016-06-11: qty 2

## 2016-06-11 NOTE — ED Triage Notes (Signed)
Patient complains of intermittent chest tightness since last pm. Reports some diaphoresis with same and intermittent dizziness.

## 2016-06-11 NOTE — ED Notes (Signed)
Pt transported to xray 

## 2016-06-11 NOTE — Discharge Instructions (Signed)
It was our pleasure to provide your ER care today - we hope that you feel better.  From todays lab tests, your potassium level is low (2.9) - take potassium supplement as prescribed.  Follow up with your doctor for recheck this coming week, regarding both your chest discomfort and low potassium.  Return to ER if worse, new symptoms, persistent or recurrent chest pain, trouble breathing, weak/faint, fevers, other concern.

## 2016-06-11 NOTE — ED Provider Notes (Addendum)
[facility] DEPT Provider Note   CSN: ID:[id number] Arrival date & time: 06/11/16  1548  First Provider Contact:  First MD Initiated Contact with Patient 06/11/16 1632        History   Chief Complaint Chief Complaint  Patient presents with  . Chest Pain    HPI Carmen Fischer is a 75 y.o. female.  Patient c/o brief irritating feeling in bilateral upper chest in past day, lasts seconds per episode. Occurs at rest. No relation to activity or exertion. No associated sob, nv or diaphoresis. No palpitations or sense of rapid or irregular heartbeat. Denies any 'chest pain'. No hx same. Denies hx cad. No heartburn. No chest wall strain. Symptoms do not radiate.  When present, denies specific exacerbating or alleviating factors. Denies cough, fever or uri c/o.    The history is provided by the patient.  Chest Pain   Pertinent negatives include no abdominal pain, no back pain, no cough, no fever, no headaches, no nausea, no palpitations, no shortness of breath and no vomiting.    Past Medical History:  Diagnosis Date  . Glaucoma   . High cholesterol   . High cholesterol   . Hypertension     Patient Active Problem List   Diagnosis Date Noted  . Chest pain 12/22/2012  . GERD (gastroesophageal reflux disease) 12/22/2012  . Hypertension 12/22/2012  . High cholesterol     Past Surgical History:  Procedure Laterality Date  . ABDOMINAL HYSTERECTOMY    . CHOLECYSTECTOMY      OB History    No data available       Home Medications    Prior to Admission medications   Medication Sig Start Date End Date Taking? Authorizing Provider  aspirin EC 81 MG tablet Take 81 mg by mouth daily.   Yes Historical Provider, MD  desloratadine (CLARINEX) 5 MG tablet Take 5 mg by mouth daily.   Yes Historical Provider, MD  dexlansoprazole (DEXILANT) 60 MG capsule Take 60 mg by mouth every evening.   Yes Historical Provider, MD  Polyvinyl Alcohol-Povidone (REFRESH OP) Place 1 drop into both eyes  daily as needed. For dry eyes   Yes Historical Provider, MD  simvastatin (ZOCOR) 20 MG tablet Take 20 mg by mouth every evening.    Yes Historical Provider, MD  Travoprost, BAK Free, (TRAVATAN Z) 0.004 % SOLN ophthalmic solution Place 1 drop into both eyes at bedtime.   Yes Historical Provider, MD  triamterene-hydrochlorothiazide (MAXZIDE-25) 37.5-25 MG per tablet Take 1 tablet by mouth daily.   Yes Historical Provider, MD    Family History Family History  Problem Relation Age of Onset  . Heart attack Mother   . Heart attack Father     Social History Social History  Substance Use Topics  . Smoking status: Never Smoker  . Smokeless tobacco: Never Used  . Alcohol use No     Allergies   Shellfish allergy   Review of Systems Review of Systems  Constitutional: Negative for fever.  HENT: Negative for sore throat.   Eyes: Negative for redness.  Respiratory: Negative for cough and shortness of breath.   Cardiovascular: Positive for chest pain. Negative for palpitations and leg swelling.  Gastrointestinal: Negative for abdominal pain, nausea and vomiting.  Genitourinary: Negative for flank pain.  Musculoskeletal: Negative for back pain and neck pain.  Skin: Negative for rash.  Neurological: Negative for headaches.  Hematological: Does not bruise/bleed easily.  Psychiatric/Behavioral: Negative for confusion.     Physical Exam Updated  Vital Signs BP 140/69   Pulse 65   Temp 99.1 F (37.3 C) (Oral)   Resp 18   Ht 5\' 3"  (1.6 m)   Wt 84.3 kg   SpO2 100%   BMI 32.93 kg/m   Physical Exam  Constitutional: She appears well-developed and well-nourished. No distress.  HENT:  Mouth/Throat: Oropharynx is clear and moist.  Eyes: Conjunctivae are normal. Pupils are equal, round, and reactive to light. No scleral icterus.  Neck: Neck supple. No tracheal deviation present.  Cardiovascular: Normal rate, regular rhythm, normal heart sounds and intact distal pulses.  Exam reveals no  gallop and no friction rub.   No murmur heard. Pulmonary/Chest: Effort normal and breath sounds normal. No respiratory distress. She exhibits no tenderness.  Abdominal: Soft. Normal appearance and bowel sounds are normal. She exhibits no distension. There is no tenderness.  Musculoskeletal: She exhibits no edema or tenderness.  Neurological: She is alert.  Speech clear/fluent. Motor intact bil. stre 5/5. Ambulates down hallway with steady gait.  Skin: Skin is warm and dry. No rash noted. She is not diaphoretic.  Psychiatric: She has a normal mood and affect.  Nursing note and vitals reviewed.    ED Treatments / Results  Labs (all labs ordered are listed, but only abnormal results are displayed) Results for orders placed or performed during the hospital encounter of [id number]  Basic metabolic panel  Result Value Ref Range   Sodium 134 (L) 135 - 145 mmol/L   Potassium 2.9 (L) 3.5 - 5.1 mmol/L   Chloride 97 (L) 101 - 111 mmol/L   CO2 27 22 - 32 mmol/L   Glucose, Bld 127 (H) 65 - 99 mg/dL   BUN 8 6 - 20 mg/dL   Creatinine, Ser 0.89 0.44 - 1.00 mg/dL   Calcium 9.3 8.9 - 10.3 mg/dL   GFR calc non Af Amer >60 >60 mL/min   GFR calc Af Amer >60 >60 mL/min   Anion gap 10 5 - 15  CBC  Result Value Ref Range   WBC 4.8 4.0 - 10.5 K/uL   RBC 4.74 3.87 - 5.11 MIL/uL   Hemoglobin 13.0 12.0 - 15.0 g/dL   HCT 39.5 36.0 - 46.0 %   MCV 83.3 78.0 - 100.0 fL   MCH 27.4 26.0 - 34.0 pg   MCHC 32.9 30.0 - 36.0 g/dL   RDW 14.6 11.5 - 15.5 %   Platelets 247 150 - 400 K/uL  I-stat troponin, ED  Result Value Ref Range   Troponin i, poc 0.00 0.00 - 0.08 ng/mL   Comment 3          I-stat troponin, ED  Result Value Ref Range   Troponin i, poc 0.00 0.00 - 0.08 ng/mL   Comment 3           Dg Chest 2 View  Result Date: 06/11/2016 CLINICAL DATA:  75 year old female with chest pain since yesterday. Initial encounter. EXAM: CHEST  2 VIEW COMPARISON:  03/26/2014 radiographs. FINDINGS: Lower lung volumes.  Mediastinal contours remain normal. Mild crowding of lung markings with no pneumothorax, pulmonary edema, pleural effusion or confluent pulmonary opacity. Visualized tracheal air column is within normal limits. Levoconvex thoracic scoliosis. No acute osseous abnormality identified. Stable cholecystectomy clips. IMPRESSION: Lower lung volumes, otherwise no acute cardiopulmonary abnormality. Electronically Signed   By: Genevie Ann M.D.   On: 06/11/2016 16:24    EKG  EKG Interpretation  Date/Time:  Saturday June 11 2016 15:51:31 EDT Ventricular Rate:  64  PR Interval:  152 QRS Duration: 86 QT Interval:  434 QTC Calculation: 447 R Axis:   -49 Text Interpretation:  Normal sinus rhythm Left anterior fascicular block Nonspecific T wave abnormality No previous tracing Confirmed by Ashok Cordia  MD, Lennette Bihari ([id number]) on 06/11/2016 4:13:04 PM       Radiology Dg Chest 2 View  Result Date: 06/11/2016 CLINICAL DATA:  75 year old female with chest pain since yesterday. Initial encounter. EXAM: CHEST  2 VIEW COMPARISON:  03/26/2014 radiographs. FINDINGS: Lower lung volumes. Mediastinal contours remain normal. Mild crowding of lung markings with no pneumothorax, pulmonary edema, pleural effusion or confluent pulmonary opacity. Visualized tracheal air column is within normal limits. Levoconvex thoracic scoliosis. No acute osseous abnormality identified. Stable cholecystectomy clips. IMPRESSION: Lower lung volumes, otherwise no acute cardiopulmonary abnormality. Electronically Signed   By: Genevie Ann M.D.   On: 06/11/2016 16:24    Procedures Procedures (including critical care time)  Medications Ordered in ED Medications - No data to display   Initial Impression / Assessment and Plan / ED Course  I have reviewed the triage vital signs and the nursing notes.  Pertinent labs & imaging results that were available during my care of the patient were reviewed by me and considered in my medical decision making (see chart  for details).  Clinical Course    Labs. Monitor. Ecg. Cxr.  ecg appears very similar to previous.   No current cp, or discomfort.   Ambulates to bathroom with steady gait, no unsteadiness or dizziness.   After symptoms intermittent for past day, trop neg.   Repeat/delta troponin also neg.   Patient currently appears stable for d/c.  Rec close pcp f/u.      Final Clinical Impressions(s) / ED Diagnoses   Final diagnoses:  None    New Prescriptions New Prescriptions   No medications on file         Lajean Saver, MD 06/11/16 2011

## 2016-06-14 DIAGNOSIS — H1132 Conjunctival hemorrhage, left eye: Secondary | ICD-10-CM | POA: Diagnosis not present

## 2016-06-15 DIAGNOSIS — E876 Hypokalemia: Secondary | ICD-10-CM | POA: Diagnosis not present

## 2016-06-15 DIAGNOSIS — R0789 Other chest pain: Secondary | ICD-10-CM | POA: Diagnosis not present

## 2016-06-15 DIAGNOSIS — K5900 Constipation, unspecified: Secondary | ICD-10-CM | POA: Diagnosis not present

## 2016-06-15 DIAGNOSIS — R739 Hyperglycemia, unspecified: Secondary | ICD-10-CM | POA: Diagnosis not present

## 2016-06-21 DIAGNOSIS — K5900 Constipation, unspecified: Secondary | ICD-10-CM | POA: Diagnosis not present

## 2016-06-30 DIAGNOSIS — E876 Hypokalemia: Secondary | ICD-10-CM | POA: Diagnosis not present

## 2016-08-09 DIAGNOSIS — I10 Essential (primary) hypertension: Secondary | ICD-10-CM | POA: Diagnosis not present

## 2016-08-09 DIAGNOSIS — K5900 Constipation, unspecified: Secondary | ICD-10-CM | POA: Diagnosis not present

## 2016-08-09 DIAGNOSIS — Z23 Encounter for immunization: Secondary | ICD-10-CM | POA: Diagnosis not present

## 2016-08-09 DIAGNOSIS — H409 Unspecified glaucoma: Secondary | ICD-10-CM | POA: Diagnosis not present

## 2016-08-09 DIAGNOSIS — E7800 Pure hypercholesterolemia, unspecified: Secondary | ICD-10-CM | POA: Diagnosis not present

## 2016-08-09 DIAGNOSIS — K219 Gastro-esophageal reflux disease without esophagitis: Secondary | ICD-10-CM | POA: Diagnosis not present

## 2016-08-09 DIAGNOSIS — E669 Obesity, unspecified: Secondary | ICD-10-CM | POA: Diagnosis not present

## 2016-09-19 DIAGNOSIS — K5900 Constipation, unspecified: Secondary | ICD-10-CM | POA: Diagnosis not present

## 2016-09-29 DIAGNOSIS — H6123 Impacted cerumen, bilateral: Secondary | ICD-10-CM | POA: Diagnosis not present

## 2016-09-29 DIAGNOSIS — H903 Sensorineural hearing loss, bilateral: Secondary | ICD-10-CM | POA: Diagnosis not present

## 2016-09-29 DIAGNOSIS — H9313 Tinnitus, bilateral: Secondary | ICD-10-CM | POA: Diagnosis not present

## 2016-10-07 DIAGNOSIS — H903 Sensorineural hearing loss, bilateral: Secondary | ICD-10-CM | POA: Diagnosis not present

## 2016-10-07 DIAGNOSIS — H906 Mixed conductive and sensorineural hearing loss, bilateral: Secondary | ICD-10-CM | POA: Diagnosis not present

## 2016-10-20 DIAGNOSIS — H40013 Open angle with borderline findings, low risk, bilateral: Secondary | ICD-10-CM | POA: Diagnosis not present

## 2016-10-20 DIAGNOSIS — H04123 Dry eye syndrome of bilateral lacrimal glands: Secondary | ICD-10-CM | POA: Diagnosis not present

## 2016-10-20 DIAGNOSIS — H524 Presbyopia: Secondary | ICD-10-CM | POA: Diagnosis not present

## 2016-12-23 DIAGNOSIS — K5900 Constipation, unspecified: Secondary | ICD-10-CM | POA: Diagnosis not present

## 2017-02-15 DIAGNOSIS — F430 Acute stress reaction: Secondary | ICD-10-CM | POA: Diagnosis not present

## 2017-02-15 DIAGNOSIS — H409 Unspecified glaucoma: Secondary | ICD-10-CM | POA: Diagnosis not present

## 2017-02-15 DIAGNOSIS — E7800 Pure hypercholesterolemia, unspecified: Secondary | ICD-10-CM | POA: Diagnosis not present

## 2017-02-15 DIAGNOSIS — K5900 Constipation, unspecified: Secondary | ICD-10-CM | POA: Diagnosis not present

## 2017-02-15 DIAGNOSIS — I10 Essential (primary) hypertension: Secondary | ICD-10-CM | POA: Diagnosis not present

## 2017-02-15 DIAGNOSIS — K219 Gastro-esophageal reflux disease without esophagitis: Secondary | ICD-10-CM | POA: Diagnosis not present

## 2017-07-25 DIAGNOSIS — H409 Unspecified glaucoma: Secondary | ICD-10-CM | POA: Diagnosis not present

## 2017-07-25 DIAGNOSIS — I10 Essential (primary) hypertension: Secondary | ICD-10-CM | POA: Diagnosis not present

## 2017-08-17 DIAGNOSIS — Z23 Encounter for immunization: Secondary | ICD-10-CM | POA: Diagnosis not present

## 2017-08-17 DIAGNOSIS — I10 Essential (primary) hypertension: Secondary | ICD-10-CM | POA: Diagnosis not present

## 2017-08-17 DIAGNOSIS — K5900 Constipation, unspecified: Secondary | ICD-10-CM | POA: Diagnosis not present

## 2017-08-17 DIAGNOSIS — K219 Gastro-esophageal reflux disease without esophagitis: Secondary | ICD-10-CM | POA: Diagnosis not present

## 2017-08-17 DIAGNOSIS — H409 Unspecified glaucoma: Secondary | ICD-10-CM | POA: Diagnosis not present

## 2017-08-17 DIAGNOSIS — F430 Acute stress reaction: Secondary | ICD-10-CM | POA: Diagnosis not present

## 2017-08-17 DIAGNOSIS — E7800 Pure hypercholesterolemia, unspecified: Secondary | ICD-10-CM | POA: Diagnosis not present

## 2017-10-26 DIAGNOSIS — H524 Presbyopia: Secondary | ICD-10-CM | POA: Diagnosis not present

## 2017-10-26 DIAGNOSIS — H40013 Open angle with borderline findings, low risk, bilateral: Secondary | ICD-10-CM | POA: Diagnosis not present

## 2017-10-26 DIAGNOSIS — H04123 Dry eye syndrome of bilateral lacrimal glands: Secondary | ICD-10-CM | POA: Diagnosis not present

## 2017-10-26 DIAGNOSIS — H52223 Regular astigmatism, bilateral: Secondary | ICD-10-CM | POA: Diagnosis not present

## 2017-11-22 DIAGNOSIS — F430 Acute stress reaction: Secondary | ICD-10-CM | POA: Diagnosis not present

## 2018-02-21 DIAGNOSIS — I10 Essential (primary) hypertension: Secondary | ICD-10-CM | POA: Diagnosis not present

## 2018-02-21 DIAGNOSIS — K5900 Constipation, unspecified: Secondary | ICD-10-CM | POA: Diagnosis not present

## 2018-02-21 DIAGNOSIS — E7800 Pure hypercholesterolemia, unspecified: Secondary | ICD-10-CM | POA: Diagnosis not present

## 2018-02-21 DIAGNOSIS — H409 Unspecified glaucoma: Secondary | ICD-10-CM | POA: Diagnosis not present

## 2018-02-21 DIAGNOSIS — F4320 Adjustment disorder, unspecified: Secondary | ICD-10-CM | POA: Diagnosis not present

## 2018-02-21 DIAGNOSIS — M722 Plantar fascial fibromatosis: Secondary | ICD-10-CM | POA: Diagnosis not present

## 2018-02-21 DIAGNOSIS — K219 Gastro-esophageal reflux disease without esophagitis: Secondary | ICD-10-CM | POA: Diagnosis not present

## 2018-02-21 DIAGNOSIS — Z0000 Encounter for general adult medical examination without abnormal findings: Secondary | ICD-10-CM | POA: Diagnosis not present

## 2018-02-21 DIAGNOSIS — G479 Sleep disorder, unspecified: Secondary | ICD-10-CM | POA: Diagnosis not present

## 2018-02-21 DIAGNOSIS — Z136 Encounter for screening for cardiovascular disorders: Secondary | ICD-10-CM | POA: Diagnosis not present

## 2018-03-22 ENCOUNTER — Other Ambulatory Visit: Payer: Self-pay

## 2018-03-22 ENCOUNTER — Ambulatory Visit (INDEPENDENT_AMBULATORY_CARE_PROVIDER_SITE_OTHER): Payer: Medicare Other

## 2018-03-22 ENCOUNTER — Encounter: Payer: Self-pay | Admitting: Podiatry

## 2018-03-22 ENCOUNTER — Ambulatory Visit (INDEPENDENT_AMBULATORY_CARE_PROVIDER_SITE_OTHER): Payer: Medicare Other | Admitting: Podiatry

## 2018-03-22 ENCOUNTER — Other Ambulatory Visit: Payer: Self-pay | Admitting: Podiatry

## 2018-03-22 DIAGNOSIS — M79671 Pain in right foot: Secondary | ICD-10-CM

## 2018-03-22 DIAGNOSIS — M21611 Bunion of right foot: Secondary | ICD-10-CM | POA: Diagnosis not present

## 2018-03-22 DIAGNOSIS — M21619 Bunion of unspecified foot: Secondary | ICD-10-CM

## 2018-03-22 DIAGNOSIS — M722 Plantar fascial fibromatosis: Secondary | ICD-10-CM

## 2018-03-22 MED ORDER — TRIAMCINOLONE ACETONIDE 10 MG/ML IJ SUSP
10.0000 mg | Freq: Once | INTRAMUSCULAR | Status: AC
  Administered 2018-03-22: 10 mg

## 2018-03-22 NOTE — Patient Instructions (Signed)

## 2018-03-26 NOTE — Progress Notes (Signed)
Subjective:   Patient ID: Carmen Fischer, female   DOB: 77 y.o.   MRN: [id number]   HPI And states that is gotten worse over the last patient presents stating she is had a lot of pain in the plantar aspect of the right heel for around 4 months few weeks.  She is tried stretching and shoe gear modifications without relief and also does have bunion deformity bilateral patient was noted to have no history of smoking and does not have activity to the level she like at this time   Review of Systems  All other systems reviewed and are negative.       Objective:  Physical Exam  Neurovascular status intact muscle strength is adequate range of motion within normal limits with patient noted to have exquisite discomfort plantar aspect right heel at the insertional point tendon calcaneus of around 4 months duration.  Patient also has mild structural bunion deformity bilateral that are lightly red and has gradually gotten worse with family history of condition and is noted to have good digital perfusion and well oriented x3     Assessment:  Acute plantar fasciitis of the right heel with inflammation and mild structural bunion deformity right     Plan:  H&P x-rays reviewed conditions discussed and today I injected the plantar fascia right 3 mg Kenalog 5 mg Xylocaine and advised on physical therapy anti-inflammatories and dispense fascial brace at the current time.  Patient will not have any treatment for bunion but I did advise wider shoes  X-rays indicate spur formation with mild structural bunion deformity noted

## 2018-04-11 ENCOUNTER — Ambulatory Visit: Payer: Medicare Other | Admitting: Podiatry

## 2018-04-16 ENCOUNTER — Ambulatory Visit (INDEPENDENT_AMBULATORY_CARE_PROVIDER_SITE_OTHER): Payer: Medicare Other | Admitting: Podiatry

## 2018-04-16 ENCOUNTER — Encounter: Payer: Self-pay | Admitting: Podiatry

## 2018-04-16 DIAGNOSIS — M79675 Pain in left toe(s): Secondary | ICD-10-CM

## 2018-04-16 DIAGNOSIS — B351 Tinea unguium: Secondary | ICD-10-CM

## 2018-04-16 DIAGNOSIS — M79674 Pain in right toe(s): Secondary | ICD-10-CM | POA: Diagnosis not present

## 2018-04-18 NOTE — Progress Notes (Signed)
Subjective:   Patient ID: Carmen Fischer, female   DOB: 77 y.o.   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

## 2018-05-24 DIAGNOSIS — H04123 Dry eye syndrome of bilateral lacrimal glands: Secondary | ICD-10-CM | POA: Diagnosis not present

## 2018-05-24 DIAGNOSIS — H524 Presbyopia: Secondary | ICD-10-CM | POA: Diagnosis not present

## 2018-05-24 DIAGNOSIS — H40013 Open angle with borderline findings, low risk, bilateral: Secondary | ICD-10-CM | POA: Diagnosis not present

## 2018-05-24 DIAGNOSIS — H52223 Regular astigmatism, bilateral: Secondary | ICD-10-CM | POA: Diagnosis not present

## 2018-08-27 ENCOUNTER — Other Ambulatory Visit: Payer: Self-pay | Admitting: Family Medicine

## 2018-08-27 DIAGNOSIS — Z1239 Encounter for other screening for malignant neoplasm of breast: Secondary | ICD-10-CM | POA: Diagnosis not present

## 2018-08-27 DIAGNOSIS — Z23 Encounter for immunization: Secondary | ICD-10-CM | POA: Diagnosis not present

## 2018-08-27 DIAGNOSIS — F4320 Adjustment disorder, unspecified: Secondary | ICD-10-CM | POA: Diagnosis not present

## 2018-08-27 DIAGNOSIS — H409 Unspecified glaucoma: Secondary | ICD-10-CM | POA: Diagnosis not present

## 2018-08-27 DIAGNOSIS — K5900 Constipation, unspecified: Secondary | ICD-10-CM | POA: Diagnosis not present

## 2018-08-27 DIAGNOSIS — E7800 Pure hypercholesterolemia, unspecified: Secondary | ICD-10-CM | POA: Diagnosis not present

## 2018-08-27 DIAGNOSIS — Z1231 Encounter for screening mammogram for malignant neoplasm of breast: Secondary | ICD-10-CM

## 2018-08-27 DIAGNOSIS — I10 Essential (primary) hypertension: Secondary | ICD-10-CM | POA: Diagnosis not present

## 2018-08-27 DIAGNOSIS — K219 Gastro-esophageal reflux disease without esophagitis: Secondary | ICD-10-CM | POA: Diagnosis not present

## 2018-10-09 ENCOUNTER — Ambulatory Visit
Admission: RE | Admit: 2018-10-09 | Discharge: 2018-10-09 | Disposition: A | Discharge status: Home / Self Care | Payer: Medicare Other | Source: Ambulatory Visit | Attending: Family Medicine | Admitting: Family Medicine

## 2018-10-09 DIAGNOSIS — Z1231 Encounter for screening mammogram for malignant neoplasm of breast: Secondary | ICD-10-CM

## 2018-10-10 ENCOUNTER — Other Ambulatory Visit: Payer: Self-pay | Admitting: Family Medicine

## 2018-10-10 DIAGNOSIS — R928 Other abnormal and inconclusive findings on diagnostic imaging of breast: Secondary | ICD-10-CM

## 2018-10-16 ENCOUNTER — Ambulatory Visit
Admission: RE | Admit: 2018-10-16 | Discharge: 2018-10-16 | Disposition: A | Discharge status: Home / Self Care | Payer: Medicare Other | Source: Ambulatory Visit | Attending: Family Medicine | Admitting: Family Medicine

## 2018-10-16 ENCOUNTER — Other Ambulatory Visit: Payer: Self-pay | Admitting: Family Medicine

## 2018-10-16 DIAGNOSIS — R928 Other abnormal and inconclusive findings on diagnostic imaging of breast: Secondary | ICD-10-CM | POA: Diagnosis not present

## 2018-10-16 DIAGNOSIS — N63 Unspecified lump in unspecified breast: Secondary | ICD-10-CM

## 2018-10-16 DIAGNOSIS — N6323 Unspecified lump in the left breast, lower outer quadrant: Secondary | ICD-10-CM | POA: Diagnosis not present

## 2018-10-16 DIAGNOSIS — N6321 Unspecified lump in the left breast, upper outer quadrant: Secondary | ICD-10-CM | POA: Diagnosis not present

## 2018-11-26 DIAGNOSIS — H40013 Open angle with borderline findings, low risk, bilateral: Secondary | ICD-10-CM | POA: Diagnosis not present

## 2018-11-29 DIAGNOSIS — H04123 Dry eye syndrome of bilateral lacrimal glands: Secondary | ICD-10-CM | POA: Diagnosis not present

## 2018-11-29 DIAGNOSIS — H40013 Open angle with borderline findings, low risk, bilateral: Secondary | ICD-10-CM | POA: Diagnosis not present

## 2018-12-21 DIAGNOSIS — I951 Orthostatic hypotension: Secondary | ICD-10-CM | POA: Diagnosis not present

## 2018-12-21 DIAGNOSIS — R42 Dizziness and giddiness: Secondary | ICD-10-CM | POA: Diagnosis not present

## 2018-12-21 DIAGNOSIS — I10 Essential (primary) hypertension: Secondary | ICD-10-CM | POA: Diagnosis not present

## 2018-12-25 DIAGNOSIS — J069 Acute upper respiratory infection, unspecified: Secondary | ICD-10-CM | POA: Diagnosis not present

## 2019-01-07 DIAGNOSIS — J309 Allergic rhinitis, unspecified: Secondary | ICD-10-CM | POA: Diagnosis not present

## 2019-01-07 DIAGNOSIS — L259 Unspecified contact dermatitis, unspecified cause: Secondary | ICD-10-CM | POA: Diagnosis not present

## 2019-02-26 DIAGNOSIS — Q742 Other congenital malformations of lower limb(s), including pelvic girdle: Secondary | ICD-10-CM | POA: Diagnosis not present

## 2019-02-26 DIAGNOSIS — H6981 Other specified disorders of Eustachian tube, right ear: Secondary | ICD-10-CM | POA: Diagnosis not present

## 2019-02-26 DIAGNOSIS — K5900 Constipation, unspecified: Secondary | ICD-10-CM | POA: Diagnosis not present

## 2019-04-08 DIAGNOSIS — R42 Dizziness and giddiness: Secondary | ICD-10-CM | POA: Diagnosis not present

## 2019-04-08 DIAGNOSIS — R079 Chest pain, unspecified: Secondary | ICD-10-CM | POA: Diagnosis not present

## 2019-04-09 ENCOUNTER — Other Ambulatory Visit: Payer: Self-pay

## 2019-04-09 ENCOUNTER — Other Ambulatory Visit: Payer: Self-pay | Admitting: Family Medicine

## 2019-04-09 ENCOUNTER — Ambulatory Visit
Admission: RE | Admit: 2019-04-09 | Discharge: 2019-04-09 | Disposition: A | Discharge status: Home / Self Care | Payer: Medicare Other | Source: Ambulatory Visit | Attending: Family Medicine | Admitting: Family Medicine

## 2019-04-09 DIAGNOSIS — N63 Unspecified lump in unspecified breast: Secondary | ICD-10-CM

## 2019-04-09 DIAGNOSIS — R928 Other abnormal and inconclusive findings on diagnostic imaging of breast: Secondary | ICD-10-CM | POA: Diagnosis not present

## 2019-04-09 DIAGNOSIS — N6321 Unspecified lump in the left breast, upper outer quadrant: Secondary | ICD-10-CM | POA: Diagnosis not present

## 2019-04-09 DIAGNOSIS — N6323 Unspecified lump in the left breast, lower outer quadrant: Secondary | ICD-10-CM | POA: Diagnosis not present

## 2019-04-18 ENCOUNTER — Other Ambulatory Visit: Payer: Medicare Other

## 2019-05-07 DIAGNOSIS — Z0000 Encounter for general adult medical examination without abnormal findings: Secondary | ICD-10-CM | POA: Diagnosis not present

## 2019-05-07 DIAGNOSIS — K219 Gastro-esophageal reflux disease without esophagitis: Secondary | ICD-10-CM | POA: Diagnosis not present

## 2019-05-07 DIAGNOSIS — E7800 Pure hypercholesterolemia, unspecified: Secondary | ICD-10-CM | POA: Diagnosis not present

## 2019-05-07 DIAGNOSIS — K5900 Constipation, unspecified: Secondary | ICD-10-CM | POA: Diagnosis not present

## 2019-05-07 DIAGNOSIS — J309 Allergic rhinitis, unspecified: Secondary | ICD-10-CM | POA: Diagnosis not present

## 2019-05-07 DIAGNOSIS — H409 Unspecified glaucoma: Secondary | ICD-10-CM | POA: Diagnosis not present

## 2019-05-07 DIAGNOSIS — I10 Essential (primary) hypertension: Secondary | ICD-10-CM | POA: Diagnosis not present

## 2019-05-07 DIAGNOSIS — Z1389 Encounter for screening for other disorder: Secondary | ICD-10-CM | POA: Diagnosis not present

## 2019-05-30 DIAGNOSIS — H02201 Unspecified lagophthalmos right upper eyelid: Secondary | ICD-10-CM | POA: Diagnosis not present

## 2019-05-30 DIAGNOSIS — H524 Presbyopia: Secondary | ICD-10-CM | POA: Diagnosis not present

## 2019-05-30 DIAGNOSIS — H04123 Dry eye syndrome of bilateral lacrimal glands: Secondary | ICD-10-CM | POA: Diagnosis not present

## 2019-05-30 DIAGNOSIS — H02202 Unspecified lagophthalmos right lower eyelid: Secondary | ICD-10-CM | POA: Diagnosis not present

## 2019-05-30 DIAGNOSIS — H40013 Open angle with borderline findings, low risk, bilateral: Secondary | ICD-10-CM | POA: Diagnosis not present

## 2019-05-30 DIAGNOSIS — H52223 Regular astigmatism, bilateral: Secondary | ICD-10-CM | POA: Diagnosis not present

## 2019-07-12 DIAGNOSIS — Z23 Encounter for immunization: Secondary | ICD-10-CM | POA: Diagnosis not present

## 2019-09-30 DIAGNOSIS — M4184 Other forms of scoliosis, thoracic region: Secondary | ICD-10-CM | POA: Diagnosis not present

## 2019-09-30 DIAGNOSIS — M549 Dorsalgia, unspecified: Secondary | ICD-10-CM | POA: Diagnosis not present

## 2019-10-01 DIAGNOSIS — M549 Dorsalgia, unspecified: Secondary | ICD-10-CM | POA: Diagnosis not present

## 2019-10-21 ENCOUNTER — Ambulatory Visit
Admission: RE | Admit: 2019-10-21 | Discharge: 2019-10-21 | Disposition: A | Discharge status: Home / Self Care | Payer: Medicare Other | Source: Ambulatory Visit | Attending: Family Medicine | Admitting: Family Medicine

## 2019-10-21 ENCOUNTER — Other Ambulatory Visit: Payer: Self-pay

## 2019-10-21 DIAGNOSIS — N63 Unspecified lump in unspecified breast: Secondary | ICD-10-CM

## 2019-11-07 DIAGNOSIS — H409 Unspecified glaucoma: Secondary | ICD-10-CM | POA: Diagnosis not present

## 2019-11-07 DIAGNOSIS — J309 Allergic rhinitis, unspecified: Secondary | ICD-10-CM | POA: Diagnosis not present

## 2019-11-07 DIAGNOSIS — E7800 Pure hypercholesterolemia, unspecified: Secondary | ICD-10-CM | POA: Diagnosis not present

## 2019-11-07 DIAGNOSIS — I10 Essential (primary) hypertension: Secondary | ICD-10-CM | POA: Diagnosis not present

## 2019-11-07 DIAGNOSIS — K219 Gastro-esophageal reflux disease without esophagitis: Secondary | ICD-10-CM | POA: Diagnosis not present

## 2019-11-07 DIAGNOSIS — K5900 Constipation, unspecified: Secondary | ICD-10-CM | POA: Diagnosis not present

## 2019-12-02 DIAGNOSIS — R21 Rash and other nonspecific skin eruption: Secondary | ICD-10-CM | POA: Diagnosis not present

## 2019-12-22 IMAGING — US ULTRASOUND LEFT BREAST LIMITED
1 series · 8 of 8 positions shown · non-contrast
Comparison: Previous exam(s).

CLINICAL DATA: Six-month follow-up of probably benign left breast 3
o'clock 5 mm mass.

EXAM:
DIGITAL DIAGNOSTIC LEFT MAMMOGRAM WITH CAD AND TOMO
ULTRASOUND LEFT BREAST

[Series 1: ultrasound left breast limited · 0.06mm/px · 8 of 8 slices shown]
[im 1/8]
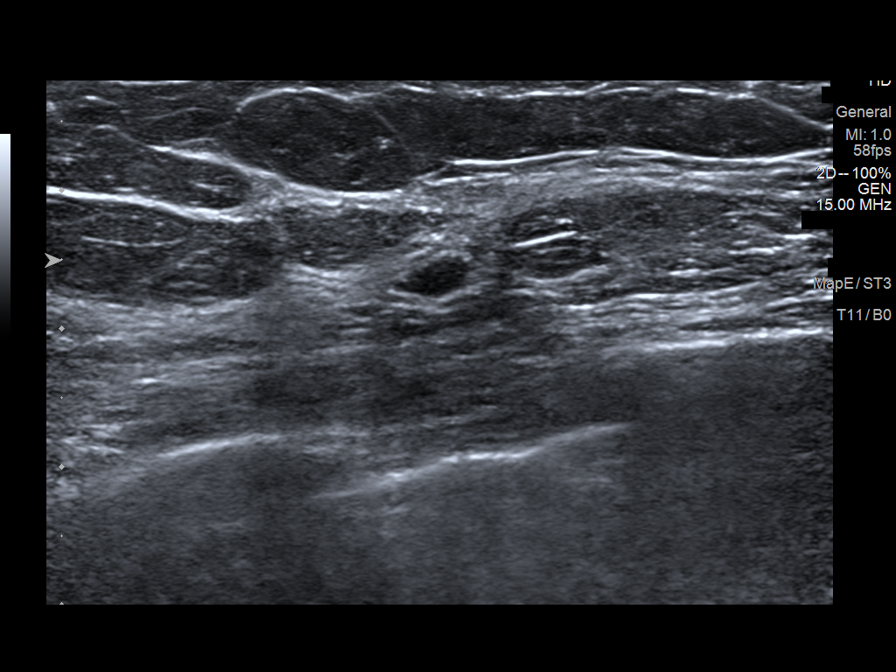
[im 2/8]
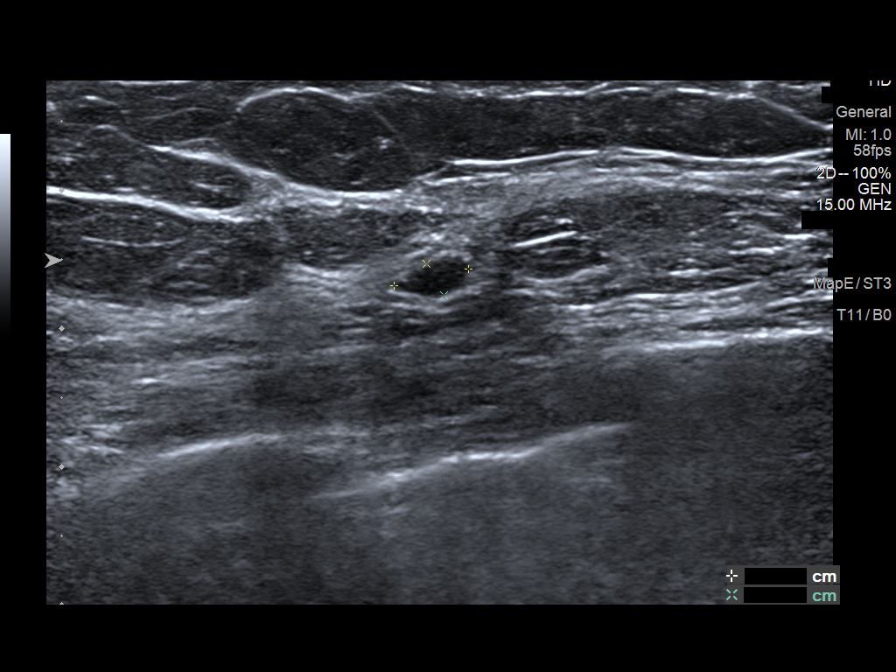
[im 3/8]
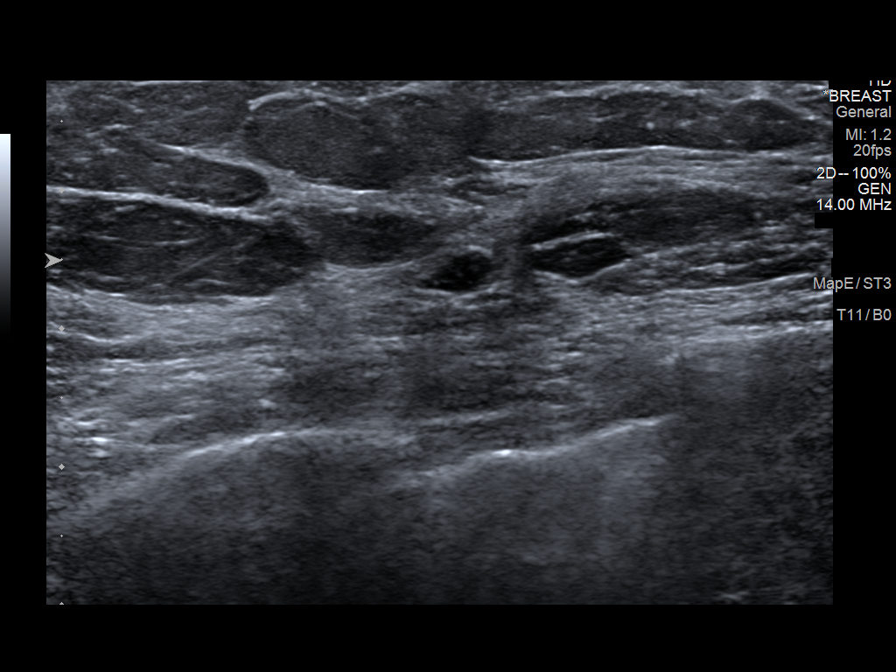
[im 4/8]
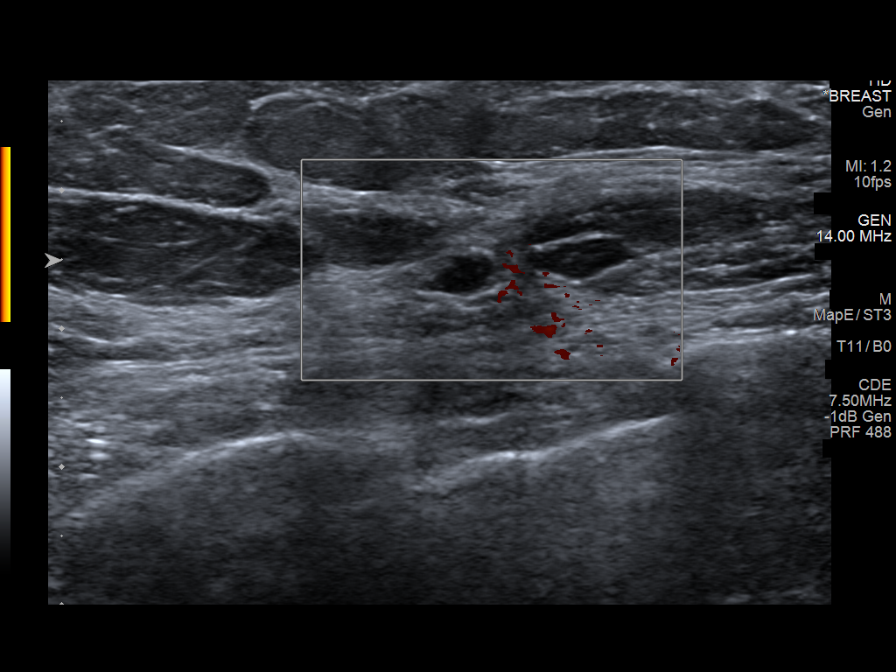
[im 5/8]
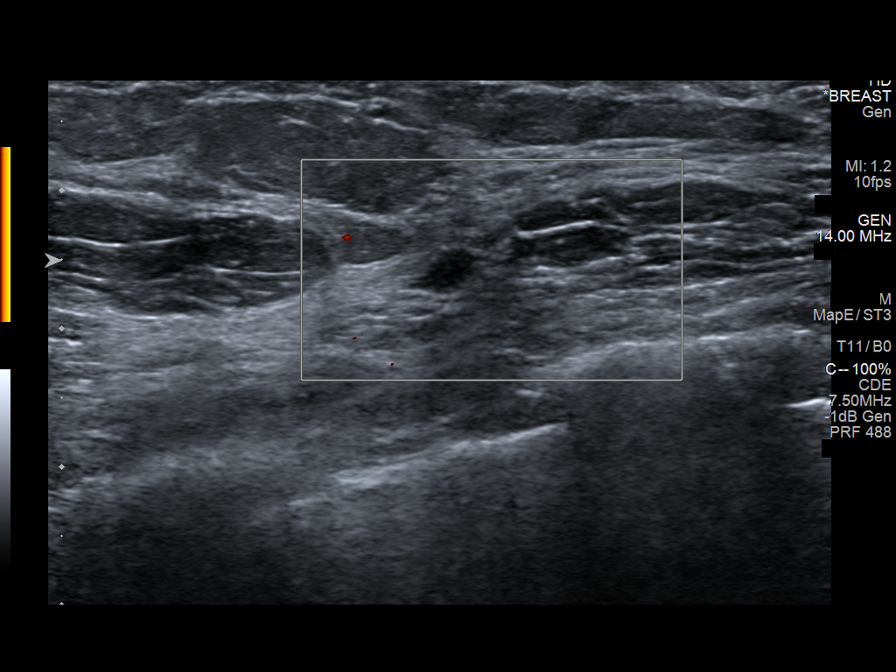
[im 6/8]
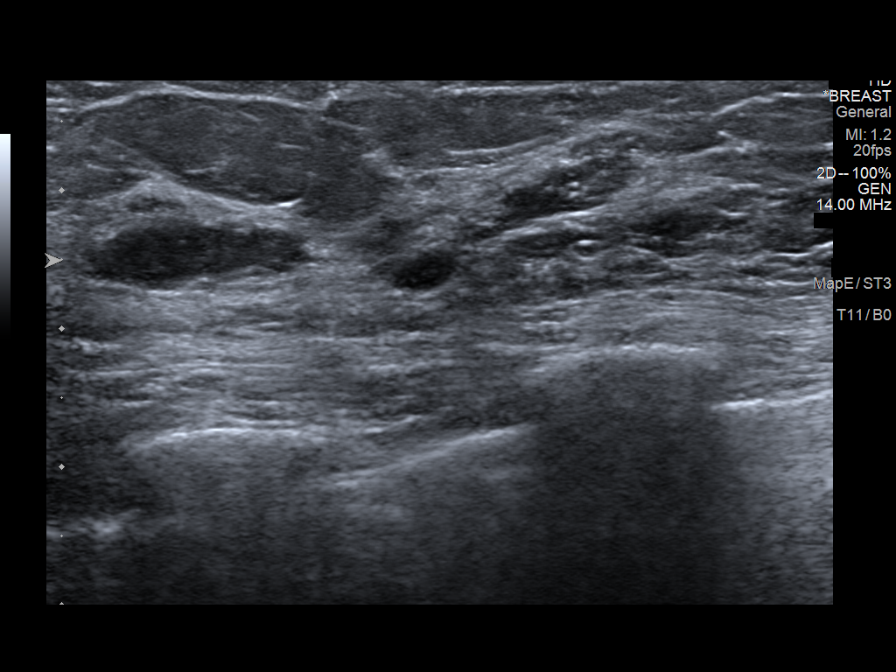
[im 7/8]
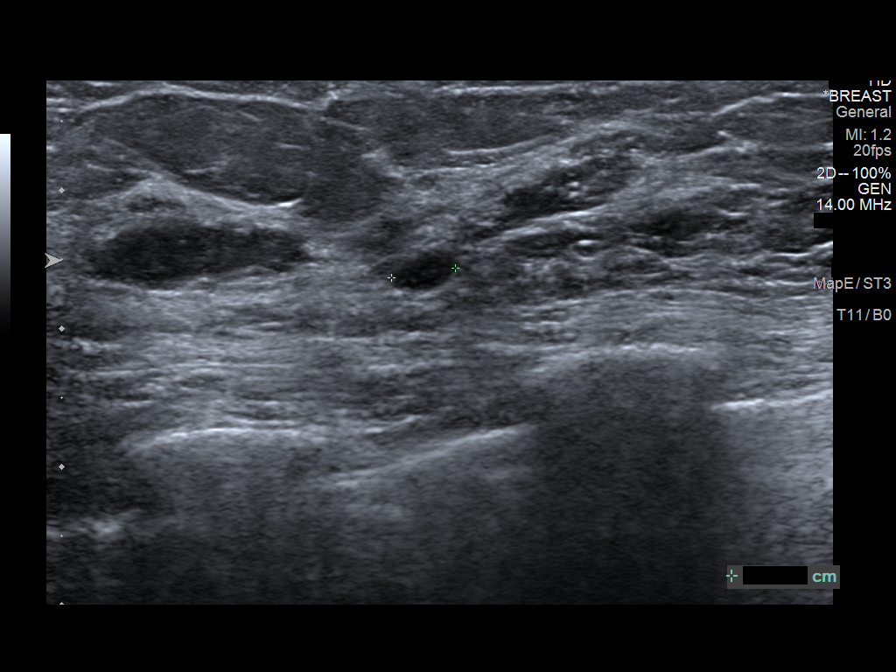
[im 8/8]
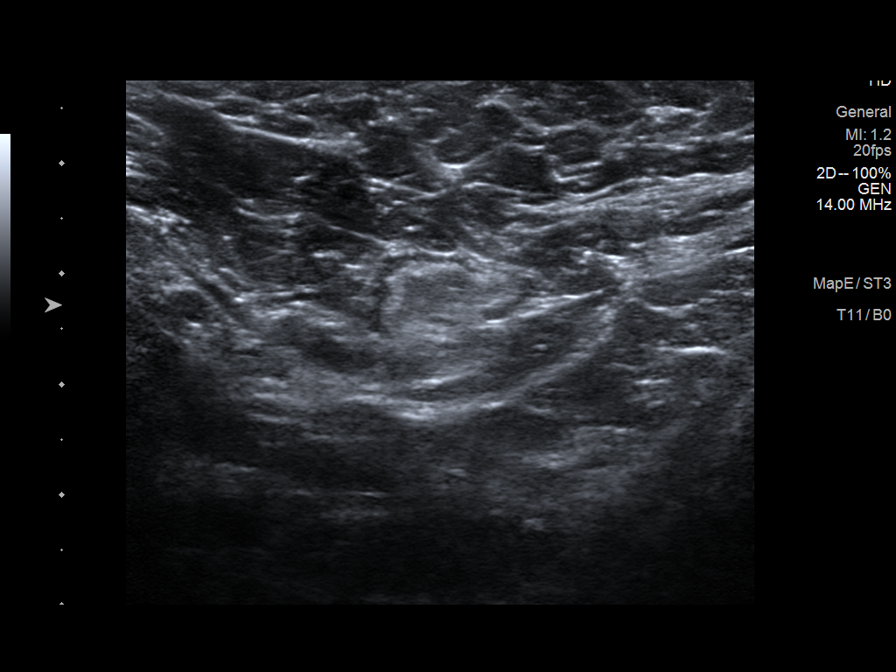

[8 of 8 positions shown; findings below may reference images not displayed]

ACR Breast Density Category b: There are scattered areas of
fibroglandular density.
FINDINGS: Mammographically, there are no suspicious masses, areas of
architectural distortion or microcalcifications in the left breast.
There is a benign-appearing less than 5 mm mass in the left breast
upper outer quadrant, posterior depth, which likely corresponds to
the 3 o'clock mass noted sonographically on the prior ultrasound
dated October 16, 2018. Benign-appearing lower left axillary lymph
node is also noted, and stable.

Mammographic images were processed with CAD.

Targeted ultrasound is performed, showing benign-appearing
hypoechoic circumscribed nodule in the left breast 3 o'clock 6 cm
from the nipple which measures 0.6 by 0.3 by 0.5 cm. This finding
corresponds to the mammographically seen nodule in the upper outer
quadrant.
IMPRESSION: Stable benign-appearing left breast 3 o'clock nodule, for which
continued short-term follow-up is recommended.

RECOMMENDATION:
Bilateral diagnostic mammogram and focused left breast ultrasound in
6 months.

I have discussed the findings and recommendations with the patient.
Results were also provided in writing at the conclusion of the
visit. If applicable, a reminder letter will be sent to the patient
regarding the next appointment.

BI-RADS CATEGORY  3: Probably benign.

## 2020-02-08 ENCOUNTER — Emergency Department (HOSPITAL_COMMUNITY)
Admission: EM | Admit: 2020-02-08 | Discharge: 2020-02-08 | Disposition: A | Discharge status: Home / Self Care | Payer: Medicare Other | Attending: Emergency Medicine | Admitting: Emergency Medicine

## 2020-02-08 ENCOUNTER — Encounter (HOSPITAL_COMMUNITY): Payer: Self-pay | Admitting: Emergency Medicine

## 2020-02-08 ENCOUNTER — Other Ambulatory Visit: Payer: Self-pay

## 2020-02-08 ENCOUNTER — Emergency Department (HOSPITAL_COMMUNITY): Payer: Medicare Other

## 2020-02-08 DIAGNOSIS — R202 Paresthesia of skin: Secondary | ICD-10-CM | POA: Diagnosis not present

## 2020-02-08 DIAGNOSIS — R200 Anesthesia of skin: Secondary | ICD-10-CM | POA: Diagnosis not present

## 2020-02-08 DIAGNOSIS — Z7982 Long term (current) use of aspirin: Secondary | ICD-10-CM | POA: Diagnosis not present

## 2020-02-08 DIAGNOSIS — I10 Essential (primary) hypertension: Secondary | ICD-10-CM | POA: Insufficient documentation

## 2020-02-08 DIAGNOSIS — R270 Ataxia, unspecified: Secondary | ICD-10-CM | POA: Diagnosis not present

## 2020-02-08 DIAGNOSIS — Z79899 Other long term (current) drug therapy: Secondary | ICD-10-CM | POA: Insufficient documentation

## 2020-02-08 DIAGNOSIS — R2681 Unsteadiness on feet: Secondary | ICD-10-CM | POA: Diagnosis present

## 2020-02-08 LAB — COMPREHENSIVE METABOLIC PANEL
ALT: 19 U/L (ref 0–44)
AST: 34 U/L (ref 15–41)
Albumin: 3.8 g/dL (ref 3.5–5.0)
Alkaline Phosphatase: 65 U/L (ref 38–126)
Anion gap: 12 (ref 5–15)
BUN: 7 mg/dL — ABNORMAL LOW (ref 8–23)
CO2: 26 mmol/L (ref 22–32)
Calcium: 9.5 mg/dL (ref 8.9–10.3)
Chloride: 98 mmol/L (ref 98–111)
Creatinine, Ser: 0.77 mg/dL (ref 0.44–1.00)
GFR calc Af Amer: >60 mL/min (ref >60)
GFR calc non Af Amer: >60 mL/min (ref >60)
Glucose, Bld: 97 mg/dL (ref 70–99)
Potassium: 3.4 mmol/L — ABNORMAL LOW (ref 3.5–5.1)
Sodium: 136 mmol/L (ref 135–145)
Total Bilirubin: 0.6 mg/dL (ref 0.3–1.2)
Total Protein: 7.2 g/dL (ref 6.5–8.1)

## 2020-02-08 LAB — CBC
HCT: 41.3 % (ref 36.0–46.0)
Hemoglobin: 13.5 g/dL (ref 12.0–15.0)
MCH: 28.1 pg (ref 26.0–34.0)
MCHC: 32.7 g/dL (ref 30.0–36.0)
MCV: 85.9 fL (ref 80.0–100.0)
Platelets: 234 10*3/uL (ref 150–400)
RBC: 4.81 MIL/uL (ref 3.87–5.11)
RDW: 14.6 % (ref 11.5–15.5)
WBC: 4.1 10*3/uL (ref 4.0–10.5)
nRBC: 0 % (ref 0.0–0.2)

## 2020-02-08 LAB — DIFFERENTIAL
Abs Immature Granulocytes: 0.01 10*3/uL (ref 0.00–0.07)
Basophils Absolute: 0 10*3/uL (ref 0.0–0.1)
Basophils Relative: 1 %
Eosinophils Absolute: 0.1 10*3/uL (ref 0.0–0.5)
Eosinophils Relative: 1 %
Immature Granulocytes: 0 %
Lymphocytes Relative: 31 %
Lymphs Abs: 1.3 10*3/uL (ref 0.7–4.0)
Monocytes Absolute: 0.3 10*3/uL (ref 0.1–1.0)
Monocytes Relative: 8 %
Neutro Abs: 2.4 10*3/uL (ref 1.7–7.7)
Neutrophils Relative %: 59 %

## 2020-02-08 LAB — CBG MONITORING, ED: Glucose-Capillary: 98 mg/dL (ref 70–99)

## 2020-02-08 LAB — PROTIME-INR
INR: 1 (ref 0.8–1.2)
Prothrombin Time: 13.4 seconds (ref 11.4–15.2)

## 2020-02-08 LAB — APTT: aPTT: 30 seconds (ref 24–36)

## 2020-02-08 MED ORDER — SODIUM CHLORIDE 0.9% FLUSH: 3.0000 mL | Freq: Once | INTRAVENOUS | Status: DC

## 2020-02-08 NOTE — ED Provider Notes (Signed)
[facility]PART[facility] Provider Note   CSN: [id number] Arrival date & time: 02/08/20  1231     History Chief Complaint  Patient presents with  . unsteady gait  . Numbness    Carmen Fischer is a 79 y.o. female.  79 year old female who awoke this morning with complaints of unsteadiness of gait without headache or dizziness.  No visual changes.  She noted some paresthesias to only her right fifth digit.  Denies any lower extremity weakness.  No facial numbness.  Denies any dysarthria.  Last seen normal was 1030 last night.  Symptoms have been persistent.  Denies any falls.  States that when she tries to walk she feels slightly off balance but does not have to hold onto things.  Does not use a cane or walker normally.  No prior history of same.  No treatment used prior to arrival        Past Medical History:  Diagnosis Date  . Glaucoma   . High cholesterol   . High cholesterol   . Hypertension     Patient Active Problem List   Diagnosis Date Noted  . Chest pain 12/22/2012  . GERD (gastroesophageal reflux disease) 12/22/2012  . Hypertension 12/22/2012  . High cholesterol     Past Surgical History:  Procedure Laterality Date  . ABDOMINAL HYSTERECTOMY    . CHOLECYSTECTOMY       OB History   No obstetric history on file.     Family History  Problem Relation Age of Onset  . Heart attack Mother   . Heart attack Father     Social History   Tobacco Use  . Smoking status: Never Smoker  . Smokeless tobacco: Never Used  Substance Use Topics  . Alcohol use: No  . Drug use: Not on file    Home Medications Prior to Admission medications   Medication Sig Start Date End Date Taking? Authorizing Provider  aspirin EC 81 MG tablet Take 81 mg by mouth daily.    [provider]  desloratadine (CLARINEX) 5 MG tablet Take 5 mg by mouth daily.    [provider]  dexlansoprazole (DEXILANT) 60 MG capsule Take 60 mg by mouth every  evening.    [provider]  Polyvinyl Alcohol-Povidone (REFRESH OP) Place 1 drop into both eyes daily as needed. For dry eyes    [provider]  potassium chloride SA (K-DUR,KLOR-CON) 20 MEQ tablet Take one tablet 2x/day for the next 3 days, then take one tablet a day 06/11/16   Lajean Saver, MD  simvastatin (ZOCOR) 20 MG tablet Take 20 mg by mouth every evening.     [provider]  Travoprost, BAK Free, (TRAVATAN Z) 0.004 % SOLN ophthalmic solution Place 1 drop into both eyes at bedtime.    [provider]  traZODone (DESYREL) 50 MG tablet  02/21/18   [provider]  triamterene-hydrochlorothiazide (MAXZIDE-25) 37.5-25 MG per tablet Take 1 tablet by mouth daily.    [provider]    Allergies    Shellfish allergy  Review of Systems   Review of Systems  All other systems reviewed and are negative.   Physical Exam Updated Vital Signs BP (!) 175/76 (BP Location: Right Arm)   Pulse 62   Temp 98.7 F (37.1 C) (Oral)   Resp 14   SpO2 100%   Physical Exam Vitals and nursing note reviewed.  Constitutional:      General: She is not in acute distress.  Appearance: Normal appearance. She is well-developed. She is not toxic-appearing.  HENT:     Head: Normocephalic and atraumatic.  Eyes:     General: Lids are normal.     Conjunctiva/sclera: Conjunctivae normal.     Pupils: Pupils are equal, round, and reactive to light.  Neck:     Thyroid: No thyroid mass.     Trachea: No tracheal deviation.  Cardiovascular:     Rate and Rhythm: Normal rate and regular rhythm.     Heart sounds: Normal heart sounds. No murmur. No gallop.   Pulmonary:     Effort: Pulmonary effort is normal. No respiratory distress.     Breath sounds: Normal breath sounds. No stridor. No decreased breath sounds, wheezing, rhonchi or rales.  Abdominal:     General: Bowel sounds are normal. There is no distension.     Palpations: Abdomen is soft.      Tenderness: There is no abdominal tenderness. There is no rebound.  Musculoskeletal:        General: No tenderness. Normal range of motion.     Cervical back: Normal range of motion and neck supple.  Skin:    General: Skin is warm and dry.     Findings: No abrasion or rash.  Neurological:     Mental Status: She is alert and oriented to person, place, and time.     GCS: GCS eye subscore is 4. GCS verbal subscore is 5. GCS motor subscore is 6.     Cranial Nerves: No cranial nerve deficit.     Sensory: Sensation is intact. No sensory deficit.     Motor: No weakness, tremor or pronator drift.     Coordination: Coordination is intact.     Gait: Gait is intact. Tandem walk normal.  Psychiatric:        Speech: Speech normal.        Behavior: Behavior normal.     ED Results / Procedures / Treatments   Labs (all labs ordered are listed, but only abnormal results are displayed) Labs Reviewed  PROTIME-INR  APTT  CBC  DIFFERENTIAL  COMPREHENSIVE METABOLIC PANEL  CBG MONITORING, ED    EKG EKG Interpretation  Date/Time:  Saturday February 08 2020 12:36:01 EDT Ventricular Rate:  60 PR Interval:  158 QRS Duration: 84 QT Interval:  438 QTC Calculation: 438 R Axis:   -30 Text Interpretation: Normal sinus rhythm Left axis deviation Abnormal ECG Confirmed by Lacretia Leigh ([id number]) on 02/08/2020 1:11:03 PM   Radiology No results found.  Procedures Procedures (including critical care time)  Medications Ordered in ED Medications  sodium chloride flush (NS) 0.9 % injection 3 mL (has no administration in time range)    ED Course  I have reviewed the triage vital signs and the nursing notes.  Pertinent labs & imaging results that were available during my care of the patient were reviewed by me and considered in my medical decision making (see chart for details).    MDM Rules/Calculators/A&P                      Patient is not unsteady on her gait here.  Head CT negative.  MRI of  brain negative.  Will discharge home Final Clinical Impression(s) / ED Diagnoses Final diagnoses:  None    Rx / DC Orders ED Discharge Orders    None       Lacretia Leigh, MD 02/08/20 [phone]

## 2020-02-08 NOTE — ED Triage Notes (Signed)
C/o numbness to R pinky finger since 4:30am and feeling off balance.  Went to bed at 10:30pm without symptoms.  No arm drift.

## 2020-02-08 NOTE — Discharge Instructions (Addendum)
Follow-up with your doctor for your right little finger numbness.  Return here if you should develop any weakness

## 2020-02-09 ENCOUNTER — Emergency Department (HOSPITAL_COMMUNITY): Payer: Medicare Other

## 2020-02-09 ENCOUNTER — Encounter (HOSPITAL_COMMUNITY): Payer: Self-pay | Admitting: Emergency Medicine

## 2020-02-09 ENCOUNTER — Emergency Department (HOSPITAL_COMMUNITY)
Admission: EM | Admit: 2020-02-09 | Discharge: 2020-02-09 | Disposition: A | Discharge status: Home / Self Care | Payer: Medicare Other | Attending: Emergency Medicine | Admitting: Emergency Medicine

## 2020-02-09 DIAGNOSIS — Z7982 Long term (current) use of aspirin: Secondary | ICD-10-CM | POA: Diagnosis not present

## 2020-02-09 DIAGNOSIS — Z79899 Other long term (current) drug therapy: Secondary | ICD-10-CM | POA: Diagnosis not present

## 2020-02-09 DIAGNOSIS — I10 Essential (primary) hypertension: Secondary | ICD-10-CM | POA: Insufficient documentation

## 2020-02-09 DIAGNOSIS — R072 Precordial pain: Secondary | ICD-10-CM | POA: Insufficient documentation

## 2020-02-09 DIAGNOSIS — E876 Hypokalemia: Secondary | ICD-10-CM | POA: Insufficient documentation

## 2020-02-09 DIAGNOSIS — R079 Chest pain, unspecified: Secondary | ICD-10-CM | POA: Diagnosis not present

## 2020-02-09 LAB — CBC
HCT: 43.1 % (ref 36.0–46.0)
Hemoglobin: 14.1 g/dL (ref 12.0–15.0)
MCH: 27.8 pg (ref 26.0–34.0)
MCHC: 32.7 g/dL (ref 30.0–36.0)
MCV: 85 fL (ref 80.0–100.0)
Platelets: 268 10*3/uL (ref 150–400)
RBC: 5.07 MIL/uL (ref 3.87–5.11)
RDW: 14.7 % (ref 11.5–15.5)
WBC: 4.9 10*3/uL (ref 4.0–10.5)
nRBC: 0 % (ref 0.0–0.2)

## 2020-02-09 LAB — BASIC METABOLIC PANEL
Anion gap: 13 (ref 5–15)
BUN: 7 mg/dL — ABNORMAL LOW (ref 8–23)
CO2: 27 mmol/L (ref 22–32)
Calcium: 9.7 mg/dL (ref 8.9–10.3)
Chloride: 96 mmol/L — ABNORMAL LOW (ref 98–111)
Creatinine, Ser: 0.86 mg/dL (ref 0.44–1.00)
GFR calc Af Amer: >60 mL/min (ref >60)
GFR calc non Af Amer: >60 mL/min (ref >60)
Glucose, Bld: 90 mg/dL (ref 70–99)
Potassium: 3 mmol/L — ABNORMAL LOW (ref 3.5–5.1)
Sodium: 136 mmol/L (ref 135–145)

## 2020-02-09 LAB — TROPONIN I (HIGH SENSITIVITY): Troponin I (High Sensitivity): 4 ng/L (ref <18)

## 2020-02-09 MED ORDER — ALUM & MAG HYDROXIDE-SIMETH 200-200-20 MG/5ML PO SUSP
30.0000 mL | Freq: Once | ORAL | Status: AC
  Administered 2020-02-09: 16:00:00 30 mL via ORAL
  Filled 2020-02-09: qty 30

## 2020-02-09 MED ORDER — POTASSIUM CHLORIDE CRYS ER 20 MEQ PO TBCR
40.0000 meq | EXTENDED_RELEASE_TABLET | Freq: Once | ORAL | Status: AC
  Administered 2020-02-09: 40 meq via ORAL
  Filled 2020-02-09: qty 2

## 2020-02-09 MED ORDER — POTASSIUM CHLORIDE CRYS ER 20 MEQ PO TBCR: 20.0000 meq | EXTENDED_RELEASE_TABLET | Freq: Every day | ORAL | 0 refills | Status: DC

## 2020-02-09 MED ORDER — ACETAMINOPHEN 500 MG PO TABS
1000.0000 mg | ORAL_TABLET | Freq: Once | ORAL | Status: AC
  Administered 2020-02-09: 16:00:00 1000 mg via ORAL
  Filled 2020-02-09: qty 2

## 2020-02-09 MED ORDER — SODIUM CHLORIDE 0.9% FLUSH: 3.0000 mL | Freq: Once | INTRAVENOUS | Status: DC

## 2020-02-09 MED ORDER — FAMOTIDINE 20 MG PO TABS
20.0000 mg | ORAL_TABLET | Freq: Once | ORAL | Status: AC
  Administered 2020-02-09: 20 mg via ORAL
  Filled 2020-02-09: qty 1

## 2020-02-09 NOTE — ED Notes (Signed)
Pt d/c home per MD order. Discharge summary reviewed with pt, pt verbalizes understanding. Pt reports son is discharge ride home. No s/s of acute distress noted at discharge,

## 2020-02-09 NOTE — Discharge Instructions (Addendum)
It was our pleasure to provide your ER care today - we hope that you feel better.  Take an enteric coated aspirin a day.  From today's lab tests your potassium level is mildly low (3) - eat plenty of fruits and vegetables, take potassium supplement as prescribed, and follow up with your doctor in 1 week.   Follow up with your doctor, and/or cardiologist in the next 1-2 weeks - call office tomorrow to arrange appointment.  Return to ER if worse, new symptoms, fevers, recurrent or persistent chest pain, trouble breathing, change in speech or vision, one-sided numbness/weakness, or other concern.

## 2020-02-09 NOTE — ED Provider Notes (Signed)
[facility] DEPARTMENT Provider Note   CSN: [id number] Arrival date & time: 02/09/20  1455     History Chief Complaint  Patient presents with  . Chest Pain    Carmen Fischer is a 79 y.o. female.  Patient c/o mid chest pain in the past day. States was seen in ED for tingling right fingers, and that has resolved, but now chest pain. Symptoms acute onset, mid chest, non radiating, dull. Symptoms at rest. Last 1-2 minutes at a time. No exertional cp or discomfort. No associated sob, nv or diaphoresis. No pleuritic pain. No leg pain or swelling. No hx cad, no hx dvt pe. Denies fam hx premature cad. No cough or uri symptoms. No fever or chills. Denies heartburn. No chest wall injury or strain.   The history is provided by the patient.  Chest Pain Associated symptoms: no abdominal pain, no back pain, no cough, no fever, no headache, no shortness of breath and no vomiting        Past Medical History:  Diagnosis Date  . Glaucoma   . High cholesterol   . High cholesterol   . Hypertension     Patient Active Problem List   Diagnosis Date Noted  . Chest pain 12/22/2012  . GERD (gastroesophageal reflux disease) 12/22/2012  . Hypertension 12/22/2012  . High cholesterol     Past Surgical History:  Procedure Laterality Date  . ABDOMINAL HYSTERECTOMY    . CHOLECYSTECTOMY       OB History   No obstetric history on file.     Family History  Problem Relation Age of Onset  . Heart attack Mother   . Heart attack Father     Social History   Tobacco Use  . Smoking status: Never Smoker  . Smokeless tobacco: Never Used  Substance Use Topics  . Alcohol use: No  . Drug use: Never    Home Medications Prior to Admission medications   Medication Sig Start Date End Date Taking? Authorizing Provider  aspirin EC 81 MG tablet Take 81 mg by mouth daily.    [provider]  desloratadine (CLARINEX) 5 MG tablet Take 5 mg by mouth daily.    [provider]  dexlansoprazole (DEXILANT) 60 MG capsule Take 60 mg by mouth every evening.    [provider]  Polyvinyl Alcohol-Povidone (REFRESH OP) Place 1 drop into both eyes daily as needed. For dry eyes    [provider]  potassium chloride SA (K-DUR,KLOR-CON) 20 MEQ tablet Take one tablet 2x/day for the next 3 days, then take one tablet a day 06/11/16   Lajean Saver, MD  simvastatin (ZOCOR) 20 MG tablet Take 20 mg by mouth every evening.     [provider]  Travoprost, BAK Free, (TRAVATAN Z) 0.004 % SOLN ophthalmic solution Place 1 drop into both eyes at bedtime.    [provider]  traZODone (DESYREL) 50 MG tablet  02/21/18   [provider]  triamterene-hydrochlorothiazide (MAXZIDE-25) 37.5-25 MG per tablet Take 1 tablet by mouth daily.    [provider]    Allergies    Shellfish allergy  Review of Systems   Review of Systems  Constitutional: Negative for fever.  HENT: Negative for sore throat.   Eyes: Negative for redness.  Respiratory: Negative for cough and shortness of breath.   Cardiovascular: Positive for chest pain.  Gastrointestinal: Negative for abdominal pain and vomiting.  Genitourinary: Negative for flank pain.  Musculoskeletal: Negative for  back pain and neck pain.  Skin: Negative for rash.  Neurological: Negative for headaches.  Hematological: Does not bruise/bleed easily.  Psychiatric/Behavioral: Negative for confusion.    Physical Exam Updated Vital Signs BP (!) 153/73 (BP Location: Right Arm)   Pulse 65   Temp 98.3 F (36.8 C)   Resp 18   SpO2 99%   Physical Exam Vitals and nursing note reviewed.  Constitutional:      Appearance: Normal appearance. She is well-developed.  HENT:     Head: Atraumatic.     Nose: Nose normal.     Mouth/Throat:     Mouth: Mucous membranes are moist.  Eyes:     General: No scleral icterus.    Conjunctiva/sclera: Conjunctivae normal.  Neck:     Trachea: No  tracheal deviation.  Cardiovascular:     Rate and Rhythm: Normal rate and regular rhythm.     Pulses: Normal pulses.     Heart sounds: Normal heart sounds. No murmur. No friction rub. No gallop.   Pulmonary:     Effort: Pulmonary effort is normal. No respiratory distress.     Breath sounds: Normal breath sounds.  Abdominal:     General: Bowel sounds are normal. There is no distension.     Palpations: Abdomen is soft.     Tenderness: There is no abdominal tenderness. There is no guarding.  Genitourinary:    Comments: No cva tenderness.  Musculoskeletal:        General: No swelling or tenderness.     Cervical back: Normal range of motion and neck supple. No rigidity. No muscular tenderness.     Right lower leg: No edema.     Left lower leg: No edema.  Skin:    General: Skin is warm and dry.     Findings: No rash.  Neurological:     Mental Status: She is alert.     Comments: Alert, speech normal.   Psychiatric:        Mood and Affect: Mood normal.      ED Results / Procedures / Treatments   Labs (all labs ordered are listed, but only abnormal results are displayed) Results for orders placed or performed during the hospital encounter of [id number]  Basic metabolic panel  Result Value Ref Range   Sodium 136 135 - 145 mmol/L   Potassium 3.0 (L) 3.5 - 5.1 mmol/L   Chloride 96 (L) 98 - 111 mmol/L   CO2 27 22 - 32 mmol/L   Glucose, Bld 90 70 - 99 mg/dL   BUN 7 (L) 8 - 23 mg/dL   Creatinine, Ser 0.86 0.44 - 1.00 mg/dL   Calcium 9.7 8.9 - 10.3 mg/dL   GFR calc non Af Amer >60 >60 mL/min   GFR calc Af Amer >60 >60 mL/min   Anion gap 13 5 - 15  CBC  Result Value Ref Range   WBC 4.9 4.0 - 10.5 K/uL   RBC 5.07 3.87 - 5.11 MIL/uL   Hemoglobin 14.1 12.0 - 15.0 g/dL   HCT 43.1 36.0 - 46.0 %   MCV 85.0 80.0 - 100.0 fL   MCH 27.8 26.0 - 34.0 pg   MCHC 32.7 30.0 - 36.0 g/dL   RDW 14.7 11.5 - 15.5 %   Platelets 268 150 - 400 K/uL   nRBC 0.0 0.0 - 0.2 %  Troponin I (High  Sensitivity)  Result Value Ref Range   Troponin I (High Sensitivity) 4 <18 ng/L   DG Chest  2 View  Result Date: 02/09/2020 CLINICAL DATA:  79 year old female with chest pain. EXAM: CHEST - 2 VIEW COMPARISON:  Chest radiograph dated 06/03/2016. FINDINGS: No focal consolidation, pleural effusion, or pneumothorax. The cardiac silhouette is within normal limits. Atherosclerotic calcification of the aorta. No acute osseous pathology. IMPRESSION: No active cardiopulmonary disease. Electronically Signed   By: Anner Crete M.D.   On: 02/09/2020 15:42   CT HEAD WO CONTRAST  Result Date: 02/08/2020 CLINICAL DATA:  Right upper extremity numbness, imbalance EXAM: CT HEAD WITHOUT CONTRAST TECHNIQUE: Contiguous axial images were obtained from the base of the skull through the vertex without intravenous contrast. COMPARISON:  None. FINDINGS: Brain: No evidence of acute infarction, hemorrhage, hydrocephalus, extra-axial collection or mass lesion/mass effect. Prominent extra-axial space in the right parietal region may reflect prior encephalomalacia. Scattered low-density changes within the periventricular and subcortical white matter compatible with chronic microvascular ischemic change. Moderate diffuse cerebral volume loss. Vascular: No hyperdense vessel or unexpected calcification. Skull: Normal. Negative for fracture or focal lesion. Sinuses/Orbits: Partial opacification of the left mastoid air cells. Paranasal sinuses are clear. Orbital structures appear unremarkable. Other: None. IMPRESSION: 1. No acute intracranial findings. 2. Partial opacification of the left mastoid air cells, which may reflect mastoiditis. 3. Chronic microvascular ischemic change and cerebral volume loss. Electronically Signed   By: Davina Poke D.O.   On: 02/08/2020 13:54   MR Brain Wo Contrast (neuro protocol)  Result Date: 02/08/2020 CLINICAL DATA:  79 year old female with ataxia, right upper extremity numbness. EXAM: MRI HEAD  WITHOUT CONTRAST TECHNIQUE: Multiplanar, multiecho pulse sequences of the brain and surrounding structures were obtained without intravenous contrast. COMPARISON:  Head CT earlier today. FINDINGS: Brain: No restricted diffusion to suggest acute infarction. No midline shift, mass effect, evidence of mass lesion, ventriculomegaly, extra-axial collection or acute intracranial hemorrhage. Cervicomedullary junction and pituitary are within normal limits. 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 Pearline Cables and white matter signal is within normal limits for age throughout the brain. No cortical encephalomalacia or definite chronic cerebral blood products. Vascular: Major intracranial vascular flow voids are preserved. Skull and upper cervical spine: Partially visible advanced cervical spine degeneration. Evidence of multilevel mild cervical spinal stenosis on series 9, images 12 and 13. Normal background bone marrow signal. Sinuses/Orbits: Postoperative changes to both globes, otherwise negative orbits. Paranasal sinuses are clear. Other: Mild bilateral mastoid air cell effusions. Negative nasopharynx. Grossly normal visible internal auditory structures. Scalp and face soft tissues appear negative. IMPRESSION: 1. No acute intracranial abnormality and essentially normal for age noncontrast MRI appearance of the brain. 2. 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 3. Mild bilateral mastoid air cell effusions, probably postinflammatory and significance doubtful. Electronically Signed   By: Genevie Ann M.D.   On: 02/08/2020 17:04    EKG EKG Interpretation  Date/Time:  Sunday February 09 2020 15:04:18 EDT Ventricular Rate:  64 PR Interval:  160 QRS Duration: 84 QT Interval:  436 QTC Calculation: 449 R Axis:   -36 Text Interpretation: Normal sinus rhythm Left axis deviation Nonspecific T wave abnormality Confirmed by Lajean Saver [phone]) on 02/09/2020 3:45:54  PM   Radiology DG Chest 2 View  Result Date: 02/09/2020 CLINICAL DATA:  79 year old female with chest pain. EXAM: CHEST - 2 VIEW COMPARISON:  Chest radiograph dated 06/03/2016. FINDINGS: No focal consolidation, pleural effusion, or pneumothorax. The cardiac silhouette is within normal limits. Atherosclerotic calcification of the aorta. No acute osseous pathology. IMPRESSION: No active cardiopulmonary disease. Electronically Signed  By: Anner Crete M.D.   On: 02/09/2020 15:42   CT HEAD WO CONTRAST  Result Date: 02/08/2020 CLINICAL DATA:  Right upper extremity numbness, imbalance EXAM: CT HEAD WITHOUT CONTRAST TECHNIQUE: Contiguous axial images were obtained from the base of the skull through the vertex without intravenous contrast. COMPARISON:  None. FINDINGS: Brain: No evidence of acute infarction, hemorrhage, hydrocephalus, extra-axial collection or mass lesion/mass effect. Prominent extra-axial space in the right parietal region may reflect prior encephalomalacia. Scattered low-density changes within the periventricular and subcortical white matter compatible with chronic microvascular ischemic change. Moderate diffuse cerebral volume loss. Vascular: No hyperdense vessel or unexpected calcification. Skull: Normal. Negative for fracture or focal lesion. Sinuses/Orbits: Partial opacification of the left mastoid air cells. Paranasal sinuses are clear. Orbital structures appear unremarkable. Other: None. IMPRESSION: 1. No acute intracranial findings. 2. Partial opacification of the left mastoid air cells, which may reflect mastoiditis. 3. Chronic microvascular ischemic change and cerebral volume loss. Electronically Signed   By: Davina Poke D.O.   On: 02/08/2020 13:54   MR Brain Wo Contrast (neuro protocol)  Result Date: 02/08/2020 CLINICAL DATA:  79 year old female with ataxia, right upper extremity numbness. EXAM: MRI HEAD WITHOUT CONTRAST TECHNIQUE: Multiplanar, multiecho pulse sequences of  the brain and surrounding structures were obtained without intravenous contrast. COMPARISON:  Head CT earlier today. FINDINGS: Brain: No restricted diffusion to suggest acute infarction. No midline shift, mass effect, evidence of mass lesion, ventriculomegaly, extra-axial collection or acute intracranial hemorrhage. Cervicomedullary junction and pituitary are within normal limits. 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 Pearline Cables and white matter signal is within normal limits for age throughout the brain. No cortical encephalomalacia or definite chronic cerebral blood products. Vascular: Major intracranial vascular flow voids are preserved. Skull and upper cervical spine: Partially visible advanced cervical spine degeneration. Evidence of multilevel mild cervical spinal stenosis on series 9, images 12 and 13. Normal background bone marrow signal. Sinuses/Orbits: Postoperative changes to both globes, otherwise negative orbits. Paranasal sinuses are clear. Other: Mild bilateral mastoid air cell effusions. Negative nasopharynx. Grossly normal visible internal auditory structures. Scalp and face soft tissues appear negative. IMPRESSION: 1. No acute intracranial abnormality and essentially normal for age noncontrast MRI appearance of the brain. 2. 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 3. Mild bilateral mastoid air cell effusions, probably postinflammatory and significance doubtful. Electronically Signed   By: Genevie Ann M.D.   On: 02/08/2020 17:04    Procedures Procedures (including critical care time)  Medications Ordered in ED Medications  sodium chloride flush (NS) 0.9 % injection 3 mL (has no administration in time range)    ED Course  I have reviewed the triage vital signs and the nursing notes.  Pertinent labs & imaging results that were available during my care of the patient were reviewed by me and considered in my medical decision  making (see chart for details).    MDM Rules/Calculators/A&P                      Iv ns. Ecg. Cxr. Labs.  Reviewed nursing notes and prior charts for additional history. Yesterday patient with ct and mri negative for acute process.  Today's symptom, chest pain, has broad diff dx including ACS, cardiac CP/angina, chest wall pain, gerd, esophageal spasm, ptx, pna - many of these conditions carry significant morbidity and mortality.   MDM Number of Diagnoses or Management Options   Amount and/or Complexity of Data Reviewed Clinical  lab tests: ordered and reviewed Tests in the radiology section of CPT: ordered and reviewed Tests in the medicine section of CPT: reviewed and ordered Decide to obtain previous medical records or to obtain history from someone other than the patient: yes Obtain history from someone other than the patient: yes Review and summarize past medical records: yes Independent visualization of images, tracings, or specimens: yes  Risk of Complications, Morbidity, and/or Mortality Presenting problems: high Diagnostic procedures: high    Initial labs reviewed/interpreted by me - wbc normal, hgb normal.   CXR reviewed/interpreted by me - no pna.   Additional lab tests reviewed and interpreted by me - trop is normal. After symptoms present for past day, trop is normal, symptoms very atypical, felt not c/w ACS.  k is mildly low. kcl po.   meds given for symptom relief - pepcid, maalox, acetaminophen.   Recheck pt, pt comfortable, no pain or discomfort. No increased wob.   Pt currently appears stable for d/c.   Rec pcp f/u. Return precautions provided.      Final Clinical Impression(s) / ED Diagnoses Final diagnoses:  None    Rx / DC Orders ED Discharge Orders    None       Lajean Saver, MD 02/09/20 1742

## 2020-02-09 NOTE — ED Triage Notes (Signed)
Pt c/o chest pressure onset this am when she woke up, pain in upper mid back as well as R jaw. All pain intermittent, pt seen yesterday for stroke s/s.

## 2020-02-18 DIAGNOSIS — I10 Essential (primary) hypertension: Secondary | ICD-10-CM | POA: Diagnosis not present

## 2020-03-02 DIAGNOSIS — H04123 Dry eye syndrome of bilateral lacrimal glands: Secondary | ICD-10-CM | POA: Diagnosis not present

## 2020-03-02 DIAGNOSIS — H40013 Open angle with borderline findings, low risk, bilateral: Secondary | ICD-10-CM | POA: Diagnosis not present

## 2020-03-02 DIAGNOSIS — Z961 Presence of intraocular lens: Secondary | ICD-10-CM | POA: Diagnosis not present

## 2020-03-04 ENCOUNTER — Other Ambulatory Visit: Payer: Self-pay

## 2020-03-04 ENCOUNTER — Encounter: Payer: Self-pay | Admitting: *Deleted

## 2020-03-04 ENCOUNTER — Ambulatory Visit (INDEPENDENT_AMBULATORY_CARE_PROVIDER_SITE_OTHER): Payer: Medicare Other | Admitting: Cardiology

## 2020-03-04 ENCOUNTER — Encounter: Payer: Self-pay | Admitting: Cardiology

## 2020-03-04 VITALS — BP 122/72 | HR 66 | Ht 63.0 in | Wt 179.4 lb

## 2020-03-04 DIAGNOSIS — E782 Mixed hyperlipidemia: Secondary | ICD-10-CM | POA: Diagnosis not present

## 2020-03-04 DIAGNOSIS — Z8249 Family history of ischemic heart disease and other diseases of the circulatory system: Secondary | ICD-10-CM | POA: Diagnosis not present

## 2020-03-04 DIAGNOSIS — R079 Chest pain, unspecified: Secondary | ICD-10-CM

## 2020-03-04 DIAGNOSIS — I10 Essential (primary) hypertension: Secondary | ICD-10-CM

## 2020-03-04 DIAGNOSIS — Z01812 Encounter for preprocedural laboratory examination: Secondary | ICD-10-CM | POA: Diagnosis not present

## 2020-03-04 MED ORDER — SPIRONOLACTONE 25 MG PO TABS: 12.5000 mg | ORAL_TABLET | Freq: Every day | ORAL | 3 refills | Status: AC

## 2020-03-04 NOTE — Progress Notes (Signed)
Cardiology Office Note:    Date:  03/04/2020   ID:  Carmen Fischer, DOB [birth year], MRN [id number]  PCP:  Gaynelle Arabian, MD  Cardiologist:  No primary care provider on file.  Electrophysiologist:  None   Referring MD: Lajean Saver, MD   Chief complain: chest pain  History of Present Illness:    Carmen Fischer is a 79 y.o. female with a hx of hypertension, hyperlipidemia and significant family history of premature coronary artery disease(father died of myocardial infarction at age 40 and mom at age of 24).  The patient presented to ER within the last 2 weeks twice with retrosternal chest pain that radiated to her jaw and arms.  She is a very healthy and active patient, she walks 3 miles several times a week, however lately she gets short of breath with exertion, yesterday she gets short of breath after just walking through the parking lot to the grocery store.  This was associated with chest tightness.  She has been very compliant with her medications and has no side effects.  She denies any dizziness or falls. In the last months she has also noticed mild lower extremity edema.  Past Medical History:  Diagnosis Date  . Glaucoma   . High cholesterol   . High cholesterol   . Hypertension    Past Surgical History:  Procedure Laterality Date  . ABDOMINAL HYSTERECTOMY    . CHOLECYSTECTOMY     Current Medications: Current Meds  Medication Sig  . aspirin EC 81 MG tablet Take 81 mg by mouth daily.  . cycloSPORINE (RESTASIS) 0.05 % ophthalmic emulsion Place 1 drop into both eyes 2 (two) times daily.  Marland Kitchen desloratadine (CLARINEX) 5 MG tablet Take 5 mg by mouth daily.  . hydrochlorothiazide (HYDRODIURIL) 25 MG tablet Take 25 mg by mouth daily.  . Multiple Vitamin (MULTIVITAMIN WITH MINERALS) TABS tablet Take 1 tablet by mouth daily.  Marland Kitchen omeprazole (PRILOSEC) 20 MG capsule Take 20 mg by mouth daily.  . polyethylene glycol (MIRALAX / GLYCOLAX) 17 g packet Take 17 g by mouth daily.  . simvastatin  (ZOCOR) 20 MG tablet Take 20 mg by mouth every evening.   . Travoprost, BAK Free, (TRAVATAN Z) 0.004 % SOLN ophthalmic solution Place 1 drop into both eyes at bedtime.    Allergies:   Shellfish allergy   Social History   Socioeconomic History  . Marital status: Married    Spouse name: Not on file  . Number of children: Not on file  . Years of education: Not on file  . Highest education level: Not on file  Occupational History  . Not on file  Tobacco Use  . Smoking status: Never Smoker  . Smokeless tobacco: Never Used  Substance and Sexual Activity  . Alcohol use: No  . Drug use: Never  . Sexual activity: Not on file  Other Topics Concern  . Not on file  Social History Narrative  . Not on file   Social Determinants of Health   Financial Resource Strain:   . Difficulty of Paying Living Expenses:   Food Insecurity:   . Worried About Charity fundraiser in the Last Year:   . Arboriculturist in the Last Year:   Transportation Needs:   . Film/video editor (Medical):   Marland Kitchen Lack of Transportation (Non-Medical):   Physical Activity:   . Days of Exercise per Week:   . Minutes of Exercise per Session:   Stress:   . Feeling of  Stress :   Social Connections:   . Frequency of Communication with Friends and Family:   . Frequency of Social Gatherings with Friends and Family:   . Attends Religious Services:   . Active Member of Clubs or Organizations:   . Attends Archivist Meetings:   Marland Kitchen Marital Status:      Family History: The patient's family history includes Heart attack in her father and mother.  ROS:   Please see the history of present illness.    All other systems reviewed and are negative.  EKGs/Labs/Other Studies Reviewed:    The following studies were reviewed today:  EKG:  EKG is ordered today.  The ekg ordered today demonstrates sinus rhythm, left axis deviation, negative T waves in leads V1 and V2, unchanged from prior, personally reviewed.   Recent Labs: 02/08/2020: ALT 19 02/09/2020: BUN 7; Creatinine, Ser 0.86; Hemoglobin 14.1; Platelets 268; Potassium 3.0; Sodium 136  Recent Lipid Panel No results found for: CHOL, TRIG, HDL, CHOLHDL, VLDL, LDLCALC, LDLDIRECT  Physical Exam:    VS:  BP 122/72   Pulse 66   Ht 5\' 3"  (1.6 m)   Wt 179 lb 6.4 oz (81.4 kg)   SpO2 95%   BMI 31.78 kg/m     Wt Readings from Last 3 Encounters:  03/04/20 179 lb 6.4 oz (81.4 kg)  06/11/16 185 lb 14.4 oz (84.3 kg)  12/23/12 168 lb 9.6 oz (76.5 kg)     GEN: Well nourished, well developed in no acute distress HEENT: Normal NECK: No JVD; No carotid bruits LYMPHATICS: No lymphadenopathy CARDIAC: RRR, no murmurs, rubs, gallops RESPIRATORY:  Clear to auscultation without rales, wheezing or rhonchi  ABDOMEN: Soft, non-tender, non-distended MUSCULOSKELETAL:  No edema; No deformity  SKIN: Warm and dry NEUROLOGIC:  Alert and oriented x 3 PSYCHIATRIC:  Normal affect   ASSESSMENT:    1. Chest pain of uncertain etiology   2. Pre-procedure lab exam   3. Essential hypertension   4. Mixed hyperlipidemia   5. Family history of early CAD    PLAN:    In order of problems listed above:  1. Chest pain -typical with risk factors that include hypertension hyperlipidemia and significant family history of premature coronary artery disease.  The patient is very healthy and active 79 year old female however she is experiencing significant changes to her symptoms suggestive of unstable angina.  We will arrange for cardiac catheterization. 2. Hypertension -controlled 3. Hyperlipidemia -on 20 mg of simvastatin, we will adjust based on findings on cardiac catheterization. 4. Mild lower extremity edema -we will add spironolactone 12.5 mg daily that will also help with her hypokalemia.   Medication Adjustments/Labs and Tests Ordered: Current medicines are reviewed at length with the patient today.  Concerns regarding medicines are outlined above.  Orders Placed  This Encounter  Procedures  . Basic metabolic panel  . CBC  . EKG 12-Lead  . ECHOCARDIOGRAM COMPLETE   Meds ordered this encounter  Medications  . spironolactone (ALDACTONE) 25 MG tablet    Sig: Take 0.5 tablets (12.5 mg total) by mouth daily.    Dispense:  45 tablet    Refill:  3    Patient Instructions  Medication Instructions:   START TAKING SPIRONOLACTONE 12.5 MG BY MOUTH DAILY  *If you need a refill on your cardiac medications before your next appointment, please call your pharmacy*  Lab Work:  [facility] LABS--BMET AND CBC  If you have labs (blood work) drawn today and your tests are completely  normal, you will receive your results only by: Marland Kitchen MyChart Message (if you have MyChart) OR . A paper copy in the mail If you have any lab test that is abnormal or we need to change your treatment, we will call you to review the results.   Testing/Procedures:  Your physician has requested that you have an echocardiogram. Echocardiography is a painless test that uses sound waves to create images of your heart. It provides your doctor with information about the size and shape of your heart and how well your heart's chambers and valves are working. This procedure takes approximately one hour. There are no restrictions for this procedure.  Your physician has requested that you have a cardiac catheterization. Cardiac catheterization is used to diagnose and/or treat various heart conditions. Doctors may recommend this procedure for a number of different reasons. The most common reason is to evaluate chest pain. Chest pain can be a symptom of coronary artery disease (CAD), and cardiac catheterization can show whether plaque is narrowing or blocking your heart's arteries. This procedure is also used to evaluate the valves, as well as measure the blood flow and oxygen levels in different parts of your heart. For further information please visit HugeFiesta.[email or web address]. Please follow instruction  sheet, as given.  YOUR CATH IS SCHEDULED FOR NEXT Tuesday 03/10/20 AT 8:30 AM FOR DR. Martinique TO DO--BE AT [facility] AT 8:30 AM--YOUR COVID TEST IS ON THIS Friday 03/06/20 AT 11:45 AM AT [facility]--PLEASE FOLLOW INSTRUCTION LETTER PROVIDED TO YOU TODAY    Follow-Up:  WILL BE ARRANGED AFTER YOUR CARDIAC CATH       Signed, Ena Dawley, MD  03/04/2020 11:24 AM    [place]

## 2020-03-04 NOTE — H&P (View-Only) (Signed)
Cardiology Office Note:    Date:  03/04/2020   ID:  Carmen Fischer, DOB 25-Jan-1941, MRN [id number]  PCP:  Gaynelle Arabian, MD  Cardiologist:  No primary care provider on file.  Electrophysiologist:  None   Referring MD: Lajean Saver, MD   Chief complain: chest pain  History of Present Illness:    Carmen Fischer is a 79 y.o. female with a hx of hypertension, hyperlipidemia and significant family history of premature coronary artery disease(father died of myocardial infarction at age 31 and mom at age of 77).  The patient presented to ER within the last 2 weeks twice with retrosternal chest pain that radiated to her jaw and arms.  She is a very healthy and active patient, she walks 3 miles several times a week, however lately she gets short of breath with exertion, yesterday she gets short of breath after just walking through the parking lot to the grocery store.  This was associated with chest tightness.  She has been very compliant with her medications and has no side effects.  She denies any dizziness or falls. In the last months she has also noticed mild lower extremity edema.  Past Medical History:  Diagnosis Date  . Glaucoma   . High cholesterol   . High cholesterol   . Hypertension    Past Surgical History:  Procedure Laterality Date  . ABDOMINAL HYSTERECTOMY    . CHOLECYSTECTOMY     Current Medications: Current Meds  Medication Sig  . aspirin EC 81 MG tablet Take 81 mg by mouth daily.  . cycloSPORINE (RESTASIS) 0.05 % ophthalmic emulsion Place 1 drop into both eyes 2 (two) times daily.  Marland Kitchen desloratadine (CLARINEX) 5 MG tablet Take 5 mg by mouth daily.  . hydrochlorothiazide (HYDRODIURIL) 25 MG tablet Take 25 mg by mouth daily.  . Multiple Vitamin (MULTIVITAMIN WITH MINERALS) TABS tablet Take 1 tablet by mouth daily.  Marland Kitchen omeprazole (PRILOSEC) 20 MG capsule Take 20 mg by mouth daily.  . polyethylene glycol (MIRALAX / GLYCOLAX) 17 g packet Take 17 g by mouth daily.  . simvastatin  (ZOCOR) 20 MG tablet Take 20 mg by mouth every evening.   . Travoprost, BAK Free, (TRAVATAN Z) 0.004 % SOLN ophthalmic solution Place 1 drop into both eyes at bedtime.    Allergies:   Shellfish allergy   Social History   Socioeconomic History  . Marital status: Married    Spouse name: Not on file  . Number of children: Not on file  . Years of education: Not on file  . Highest education level: Not on file  Occupational History  . Not on file  Tobacco Use  . Smoking status: Never Smoker  . Smokeless tobacco: Never Used  Substance and Sexual Activity  . Alcohol use: No  . Drug use: Never  . Sexual activity: Not on file  Other Topics Concern  . Not on file  Social History Narrative  . Not on file   Social Determinants of Health   Financial Resource Strain:   . Difficulty of Paying Living Expenses:   Food Insecurity:   . Worried About Charity fundraiser in the Last Year:   . Arboriculturist in the Last Year:   Transportation Needs:   . Film/video editor (Medical):   Marland Kitchen Lack of Transportation (Non-Medical):   Physical Activity:   . Days of Exercise per Week:   . Minutes of Exercise per Session:   Stress:   . Feeling of  Stress :   Social Connections:   . Frequency of Communication with Friends and Family:   . Frequency of Social Gatherings with Friends and Family:   . Attends Religious Services:   . Active Member of Clubs or Organizations:   . Attends Archivist Meetings:   Marland Kitchen Marital Status:      Family History: The patient's family history includes Heart attack in her father and mother.  ROS:   Please see the history of present illness.    All other systems reviewed and are negative.  EKGs/Labs/Other Studies Reviewed:    The following studies were reviewed today:  EKG:  EKG is ordered today.  The ekg ordered today demonstrates sinus rhythm, left axis deviation, negative T waves in leads V1 and V2, unchanged from prior, personally  reviewed.  Recent Labs: 02/08/2020: ALT 19 02/09/2020: BUN 7; Creatinine, Ser 0.86; Hemoglobin 14.1; Platelets 268; Potassium 3.0; Sodium 136  Recent Lipid Panel No results found for: CHOL, TRIG, HDL, CHOLHDL, VLDL, LDLCALC, LDLDIRECT  Physical Exam:    VS:  BP 122/72   Pulse 66   Ht 5\' 3"  (1.6 m)   Wt 179 lb 6.4 oz (81.4 kg)   SpO2 95%   BMI 31.78 kg/m     Wt Readings from Last 3 Encounters:  03/04/20 179 lb 6.4 oz (81.4 kg)  06/11/16 185 lb 14.4 oz (84.3 kg)  12/23/12 168 lb 9.6 oz (76.5 kg)     GEN: Well nourished, well developed in no acute distress HEENT: Normal NECK: No JVD; No carotid bruits LYMPHATICS: No lymphadenopathy CARDIAC: RRR, no murmurs, rubs, gallops RESPIRATORY:  Clear to auscultation without rales, wheezing or rhonchi  ABDOMEN: Soft, non-tender, non-distended MUSCULOSKELETAL:  No edema; No deformity  SKIN: Warm and dry NEUROLOGIC:  Alert and oriented x 3 PSYCHIATRIC:  Normal affect   ASSESSMENT:    1. Chest pain of uncertain etiology   2. Pre-procedure lab exam   3. Essential hypertension   4. Mixed hyperlipidemia   5. Family history of early CAD    PLAN:    In order of problems listed above:  1. Chest pain -typical with risk factors that include hypertension hyperlipidemia and significant family history of premature coronary artery disease.  The patient is very healthy and active 79 year old female however she is experiencing significant changes to her symptoms suggestive of unstable angina.  We will arrange for cardiac catheterization. 2. Hypertension -controlled 3. Hyperlipidemia -on 20 mg of simvastatin, we will adjust based on findings on cardiac catheterization. 4. Mild lower extremity edema -we will add spironolactone 12.5 mg daily that will also help with her hypokalemia.   Medication Adjustments/Labs and Tests Ordered: Current medicines are reviewed at length with the patient today.  Concerns regarding medicines are outlined above.   Orders Placed This Encounter  Procedures  . Basic metabolic panel  . CBC  . EKG 12-Lead  . ECHOCARDIOGRAM COMPLETE   Meds ordered this encounter  Medications  . spironolactone (ALDACTONE) 25 MG tablet    Sig: Take 0.5 tablets (12.5 mg total) by mouth daily.    Dispense:  45 tablet    Refill:  3    Patient Instructions  Medication Instructions:   START TAKING SPIRONOLACTONE 12.5 MG BY MOUTH DAILY  *If you need a refill on your cardiac medications before your next appointment, please call your pharmacy*  Lab Work:  [facility] LABS--BMET AND CBC  If you have labs (blood work) drawn today and your tests are completely  normal, you will receive your results only by: Marland Kitchen MyChart Message (if you have MyChart) OR . A paper copy in the mail If you have any lab test that is abnormal or we need to change your treatment, we will call you to review the results.   Testing/Procedures:  Your physician has requested that you have an echocardiogram. Echocardiography is a painless test that uses sound waves to create images of your heart. It provides your doctor with information about the size and shape of your heart and how well your heart's chambers and valves are working. This procedure takes approximately one hour. There are no restrictions for this procedure.  Your physician has requested that you have a cardiac catheterization. Cardiac catheterization is used to diagnose and/or treat various heart conditions. Doctors may recommend this procedure for a number of different reasons. The most common reason is to evaluate chest pain. Chest pain can be a symptom of coronary artery disease (CAD), and cardiac catheterization can show whether plaque is narrowing or blocking your heart's arteries. This procedure is also used to evaluate the valves, as well as measure the blood flow and oxygen levels in different parts of your heart. For further information please visit HugeFiesta.[email or web address]. Please  follow instruction sheet, as given.  YOUR CATH IS SCHEDULED FOR NEXT Tuesday 03/10/20 AT 8:30 AM FOR DR. Martinique TO DO--BE AT [facility] AT 8:30 AM--YOUR COVID TEST IS ON THIS Friday 03/06/20 AT 11:45 AM AT [facility]--PLEASE FOLLOW INSTRUCTION LETTER PROVIDED TO YOU TODAY    Follow-Up:  WILL BE ARRANGED AFTER YOUR CARDIAC CATH       Signed, Ena Dawley, MD  03/04/2020 11:24 AM    [place]

## 2020-03-04 NOTE — Addendum Note (Signed)
Addended by: Dorothy Spark on: 03/04/2020 01:17 PM   Modules accepted: Orders, SmartSet

## 2020-03-04 NOTE — Patient Instructions (Signed)
Medication Instructions:   START TAKING SPIRONOLACTONE 12.5 MG BY MOUTH DAILY  *If you need a refill on your cardiac medications before your next appointment, please call your pharmacy*  Lab Work:  [facility] LABS--BMET AND CBC  If you have labs (blood work) drawn today and your tests are completely normal, you will receive your results only by: Marland Kitchen MyChart Message (if you have MyChart) OR . A paper copy in the mail If you have any lab test that is abnormal or we need to change your treatment, we will call you to review the results.   Testing/Procedures:  Your physician has requested that you have an echocardiogram. Echocardiography is a painless test that uses sound waves to create images of your heart. It provides your doctor with information about the size and shape of your heart and how well your heart's chambers and valves are working. This procedure takes approximately one hour. There are no restrictions for this procedure.  Your physician has requested that you have a cardiac catheterization. Cardiac catheterization is used to diagnose and/or treat various heart conditions. Doctors may recommend this procedure for a number of different reasons. The most common reason is to evaluate chest pain. Chest pain can be a symptom of coronary artery disease (CAD), and cardiac catheterization can show whether plaque is narrowing or blocking your heart's arteries. This procedure is also used to evaluate the valves, as well as measure the blood flow and oxygen levels in different parts of your heart. For further information please visit HugeFiesta.[email or web address]. Please follow instruction sheet, as given.  YOUR CATH IS SCHEDULED FOR NEXT Tuesday 03/10/20 AT 8:30 AM FOR DR. Martinique TO DO--BE AT [facility] AT 8:30 AM--YOUR COVID TEST IS ON THIS Friday 03/06/20 AT 11:45 AM AT [facility]--PLEASE FOLLOW INSTRUCTION LETTER PROVIDED TO YOU TODAY    Follow-Up:  WILL BE ARRANGED  AFTER YOUR CARDIAC CATH

## 2020-03-05 LAB — BASIC METABOLIC PANEL
BUN/Creatinine Ratio: 13 (ref 12–28)
BUN: 10 mg/dL (ref 8–27)
CO2: 29 mmol/L (ref 20–29)
Calcium: 10.3 mg/dL (ref 8.7–10.3)
Chloride: 98 mmol/L (ref 96–106)
Creatinine, Ser: 0.78 mg/dL (ref 0.57–1.00)
GFR calc Af Amer: 82 mL/min/{1.73_m2} (ref >59)
GFR calc non Af Amer: 72 mL/min/{1.73_m2} (ref >59)
Glucose: 94 mg/dL (ref 65–99)
Potassium: 3.4 mmol/L — ABNORMAL LOW (ref 3.5–5.2)
Sodium: 143 mmol/L (ref 134–144)

## 2020-03-05 LAB — CBC
Hematocrit: 43 % (ref 34.0–46.6)
Hemoglobin: 13.9 g/dL (ref 11.1–15.9)
MCH: 27.4 pg (ref 26.6–33.0)
MCHC: 32.3 g/dL (ref 31.5–35.7)
MCV: 85 fL (ref 79–97)
Platelets: 269 10*3/uL (ref 150–450)
RBC: 5.08 x10E6/uL (ref 3.77–5.28)
RDW: 14.4 % (ref 11.7–15.4)
WBC: 5 10*3/uL (ref 3.4–10.8)

## 2020-03-06 ENCOUNTER — Other Ambulatory Visit (HOSPITAL_COMMUNITY)
Admission: RE | Admit: 2020-03-06 | Discharge: 2020-03-06 | Disposition: A | Discharge status: Home / Self Care | Payer: Medicare Other | Source: Ambulatory Visit | Attending: Cardiology | Admitting: Cardiology

## 2020-03-06 DIAGNOSIS — Z20822 Contact with and (suspected) exposure to covid-19: Secondary | ICD-10-CM | POA: Diagnosis not present

## 2020-03-06 DIAGNOSIS — Z01812 Encounter for preprocedural laboratory examination: Secondary | ICD-10-CM | POA: Insufficient documentation

## 2020-03-07 LAB — SARS CORONAVIRUS 2 (TAT 6-24 HRS): SARS Coronavirus 2: NEGATIVE

## 2020-03-09 ENCOUNTER — Telehealth: Payer: Self-pay | Admitting: *Deleted

## 2020-03-09 NOTE — Telephone Encounter (Signed)
Pt contacted pre-catheterization scheduled at [facility] for: Tuesday Mar 10, 2020 10:30 AM Verified arrival time and place: [facility]) at: 8:30 AM   No solid food after midnight prior to cath, clear liquids until 5 AM day of procedure.  Hold: HCTZ -AM of procedure Spironolactone-AM of procedure   Except hold medications AM meds can be  taken pre-cath with sip of water including: ASA 81 mg   Confirmed patient has responsible adult to drive home post procedure and observe 24 hours after arriving home: yes   You are allowed ONE visitor in the waiting room during your procedures. Both you and your visitor must wear masks.      COVID-19 Pre-Screening Questions:  . In the past 7 to 10 days have you had a cough,  shortness of breath, headache, congestion, fever (100 or greater) body aches, chills, sore throat, or sudden loss of taste or sense of smell? no . Have you been around anyone with known Covid 19 in the past 7 to 10 days? no . Have you been around anyone who is awaiting Covid 19 test results in the past 7 to 10 days? no . Have you been around anyone who has mentioned symptoms of Covid 19 within the past 7 to 10 days? no  Reviewed procedure/mask/visitor instructions, COVID-19 screening questions with patient.

## 2020-03-10 ENCOUNTER — Encounter (HOSPITAL_COMMUNITY)
Admission: RE | Disposition: A | Discharge status: Home / Self Care | Payer: Self-pay | Source: Home / Self Care | Attending: Cardiology

## 2020-03-10 ENCOUNTER — Ambulatory Visit (HOSPITAL_COMMUNITY)
Admission: RE | Admit: 2020-03-10 | Discharge: 2020-03-10 | Disposition: A | Discharge status: Home / Self Care | Payer: Medicare Other | Attending: Cardiology | Admitting: Cardiology

## 2020-03-10 ENCOUNTER — Other Ambulatory Visit: Payer: Self-pay

## 2020-03-10 DIAGNOSIS — Z8249 Family history of ischemic heart disease and other diseases of the circulatory system: Secondary | ICD-10-CM | POA: Diagnosis not present

## 2020-03-10 DIAGNOSIS — H409 Unspecified glaucoma: Secondary | ICD-10-CM | POA: Diagnosis not present

## 2020-03-10 DIAGNOSIS — E876 Hypokalemia: Secondary | ICD-10-CM | POA: Diagnosis not present

## 2020-03-10 DIAGNOSIS — E782 Mixed hyperlipidemia: Secondary | ICD-10-CM | POA: Diagnosis not present

## 2020-03-10 DIAGNOSIS — Z79899 Other long term (current) drug therapy: Secondary | ICD-10-CM | POA: Insufficient documentation

## 2020-03-10 DIAGNOSIS — Z7982 Long term (current) use of aspirin: Secondary | ICD-10-CM | POA: Diagnosis not present

## 2020-03-10 DIAGNOSIS — E7800 Pure hypercholesterolemia, unspecified: Secondary | ICD-10-CM | POA: Diagnosis present

## 2020-03-10 DIAGNOSIS — I10 Essential (primary) hypertension: Secondary | ICD-10-CM | POA: Diagnosis present

## 2020-03-10 DIAGNOSIS — R079 Chest pain, unspecified: Secondary | ICD-10-CM | POA: Diagnosis present

## 2020-03-10 HISTORY — PX: LEFT HEART CATH AND CORONARY ANGIOGRAPHY: CATH118249

## 2020-03-10 SURGERY — LEFT HEART CATH AND CORONARY ANGIOGRAPHY
Anesthesia: LOCAL

## 2020-03-10 MED ORDER — SODIUM CHLORIDE 0.9 % WEIGHT BASED INFUSION: 1.0000 mL/kg/h | INTRAVENOUS | Status: AC

## 2020-03-10 MED ORDER — IOHEXOL 350 MG/ML SOLN
INTRAVENOUS | Status: DC | PRN
  Administered 2020-03-10: 60 mL

## 2020-03-10 MED ORDER — MIDAZOLAM HCL 2 MG/2ML IJ SOLN
INTRAMUSCULAR | Status: AC
  Filled 2020-03-10: qty 2

## 2020-03-10 MED ORDER — HEPARIN (PORCINE) IN NACL 1000-0.9 UT/500ML-% IV SOLN
INTRAVENOUS | Status: AC
  Filled 2020-03-10: qty 1000

## 2020-03-10 MED ORDER — SODIUM CHLORIDE 0.9 % IV SOLN: 250.0000 mL | INTRAVENOUS | Status: DC | PRN

## 2020-03-10 MED ORDER — HEPARIN SODIUM (PORCINE) 1000 UNIT/ML IJ SOLN
INTRAMUSCULAR | Status: DC | PRN
  Administered 2020-03-10: 4000 [IU] via INTRAVENOUS

## 2020-03-10 MED ORDER — MIDAZOLAM HCL 2 MG/2ML IJ SOLN
INTRAMUSCULAR | Status: DC | PRN
  Administered 2020-03-10: 1 mg via INTRAVENOUS

## 2020-03-10 MED ORDER — LIDOCAINE HCL (PF) 1 % IJ SOLN
INTRAMUSCULAR | Status: AC
  Filled 2020-03-10: qty 30

## 2020-03-10 MED ORDER — HYDRALAZINE HCL 20 MG/ML IJ SOLN: 10.0000 mg | INTRAMUSCULAR | Status: DC | PRN

## 2020-03-10 MED ORDER — SODIUM CHLORIDE 0.9 % WEIGHT BASED INFUSION: 1.0000 mL/kg/h | INTRAVENOUS | Status: DC

## 2020-03-10 MED ORDER — SODIUM CHLORIDE 0.9 % WEIGHT BASED INFUSION
3.0000 mL/kg/h | INTRAVENOUS | Status: AC
  Administered 2020-03-10: 09:00:00 3 mL/kg/h via INTRAVENOUS

## 2020-03-10 MED ORDER — ATORVASTATIN CALCIUM 80 MG PO TABS: 80.0000 mg | ORAL_TABLET | Freq: Every day | ORAL | Status: DC

## 2020-03-10 MED ORDER — FENTANYL CITRATE (PF) 100 MCG/2ML IJ SOLN
INTRAMUSCULAR | Status: AC
  Filled 2020-03-10: qty 2

## 2020-03-10 MED ORDER — VERAPAMIL HCL 2.5 MG/ML IV SOLN
INTRAVENOUS | Status: DC | PRN
  Administered 2020-03-10: 10 mL via INTRA_ARTERIAL

## 2020-03-10 MED ORDER — SODIUM CHLORIDE 0.9% FLUSH: 3.0000 mL | INTRAVENOUS | Status: DC | PRN

## 2020-03-10 MED ORDER — ONDANSETRON HCL 4 MG/2ML IJ SOLN: 4.0000 mg | Freq: Four times a day (QID) | INTRAMUSCULAR | Status: DC | PRN

## 2020-03-10 MED ORDER — LIDOCAINE HCL (PF) 1 % IJ SOLN
INTRAMUSCULAR | Status: DC | PRN
  Administered 2020-03-10: 2 mL

## 2020-03-10 MED ORDER — ASPIRIN 81 MG PO CHEW: 81.0000 mg | CHEWABLE_TABLET | ORAL | Status: DC

## 2020-03-10 MED ORDER — FENTANYL CITRATE (PF) 100 MCG/2ML IJ SOLN
INTRAMUSCULAR | Status: DC | PRN
  Administered 2020-03-10: 25 ug via INTRAVENOUS

## 2020-03-10 MED ORDER — VERAPAMIL HCL 2.5 MG/ML IV SOLN
INTRAVENOUS | Status: AC
  Filled 2020-03-10: qty 2

## 2020-03-10 MED ORDER — SODIUM CHLORIDE 0.9% FLUSH: 3.0000 mL | Freq: Two times a day (BID) | INTRAVENOUS | Status: DC

## 2020-03-10 MED ORDER — ATORVASTATIN CALCIUM 80 MG PO TABS: 80.0000 mg | ORAL_TABLET | ORAL | Status: DC

## 2020-03-10 MED ORDER — CLONAZEPAM 0.5 MG PO TABS: 0.2500 mg | ORAL_TABLET | Freq: Every day | ORAL | Status: DC

## 2020-03-10 MED ORDER — HEPARIN (PORCINE) IN NACL 1000-0.9 UT/500ML-% IV SOLN
INTRAVENOUS | Status: DC | PRN
  Administered 2020-03-10 (×2): 500 mL

## 2020-03-10 MED ORDER — HEPARIN SODIUM (PORCINE) 1000 UNIT/ML IJ SOLN
INTRAMUSCULAR | Status: AC
  Filled 2020-03-10: qty 1

## 2020-03-10 MED ORDER — ACETAMINOPHEN 325 MG PO TABS: 650.0000 mg | ORAL_TABLET | ORAL | Status: DC | PRN

## 2020-03-10 SURGICAL SUPPLY — 10 items
CATH 5FR JL3.5 JR4 ANG PIG MP (CATHETERS) ×2 IMPLANT
DEVICE RAD COMP TR BAND LRG (VASCULAR PRODUCTS) ×2 IMPLANT
GLIDESHEATH SLEND SS 6F .021 (SHEATH) ×2 IMPLANT
GUIDEWIRE INQWIRE 1.5J.035X260 (WIRE) ×1 IMPLANT
INQWIRE 1.5J .035X260CM (WIRE) ×2
KIT HEART LEFT (KITS) ×2 IMPLANT
PACK CARDIAC CATHETERIZATION (CUSTOM PROCEDURE TRAY) ×2 IMPLANT
SYR MEDRAD MARK 7 150ML (SYRINGE) ×2 IMPLANT
TRANSDUCER W/STOPCOCK (MISCELLANEOUS) ×2 IMPLANT
TUBING CIL FLEX 10 FLL-RA (TUBING) ×2 IMPLANT

## 2020-03-10 NOTE — Interval H&P Note (Signed)
History and Physical Interval Note:  03/10/2020 9:44 AM  Carmen Fischer  has presented today for surgery, with the diagnosis of chest pain.  The various methods of treatment have been discussed with the patient and family. After consideration of risks, benefits and other options for treatment, the patient has consented to  Procedure(s): LEFT HEART CATH AND CORONARY ANGIOGRAPHY (N/A) as a surgical intervention.  The patient's history has been reviewed, patient examined, no change in status, stable for surgery.  I have reviewed the patient's chart and labs.  Questions were answered to the patient's satisfaction.   Cath Lab Visit (complete for each Cath Lab visit)  Clinical Evaluation Leading to the Procedure:   ACS: Yes.    Non-ACS:    Anginal Classification: CCS III  Anti-ischemic medical therapy: No Therapy  Non-Invasive Test Results: No non-invasive testing performed  Prior CABG: No previous CABG        Collier Salina [facility] 03/10/2020 9:44 AM

## 2020-03-10 NOTE — Discharge Instructions (Signed)
Radial Site Care  This sheet gives you information about how to care for yourself after your procedure. Your health care provider may also give you more specific instructions. If you have problems or questions, contact your health care provider. What can I expect after the procedure? After the procedure, it is common to have:  Bruising and tenderness at the catheter insertion area. Follow these instructions at home: Medicines  Take over-the-counter and prescription medicines only as told by your health care provider. Insertion site care  Follow instructions from your health care provider about how to take care of your insertion site. Make sure you: ? Wash your hands with soap and water before you change your bandage (dressing). If soap and water are not available, use hand sanitizer. ? Change your dressing as told by your health care provider. ? Leave stitches (sutures), skin glue, or adhesive strips in place. These skin closures may need to stay in place for 2 weeks or longer. If adhesive strip edges start to loosen and curl up, you may trim the loose edges. Do not remove adhesive strips completely unless your health care provider tells you to do that.  Check your insertion site every day for signs of infection. Check for: ? Redness, swelling, or pain. ? Fluid or blood. ? Pus or a bad smell. ? Warmth.  Do not take baths, swim, or use a hot tub until your health care provider approves.  You may shower 24-48 hours after the procedure, or as directed by your health care provider. ? Remove the dressing and gently wash the site with plain soap and water. ? Pat the area dry with a clean towel. ? Do not rub the site. That could cause bleeding.  Do not apply powder or lotion to the site. Activity   For 24 hours after the procedure, or as directed by your health care provider: ? Do not flex or bend the affected arm. ? Do not push or pull heavy objects with the affected arm. ? Do not  drive yourself home from the hospital or clinic. You may drive 24 hours after the procedure unless your health care provider tells you not to. ? Do not operate machinery or power tools.  Do not lift anything that is heavier than 10 lb (4.5 kg), or the limit that you are told, until your health care provider says that it is safe.  Ask your health care provider when it is okay to: ? Return to work or school. ? Resume usual physical activities or sports. ? Resume sexual activity. General instructions  If the catheter site starts to bleed, raise your arm and put firm pressure on the site. If the bleeding does not stop, get help right away. This is a medical emergency.  If you went home on the same day as your procedure, a responsible adult should be with you for the first 24 hours after you arrive home.  Keep all follow-up visits as told by your health care provider. This is important. Contact a health care provider if:  You have a fever.  You have redness, swelling, or yellow drainage around your insertion site. Get help right away if:  You have unusual pain at the radial site.  The catheter insertion area swells very fast.  The insertion area is bleeding, and the bleeding does not stop when you hold steady pressure on the area.  Your arm or hand becomes pale, cool, tingly, or numb. These symptoms may represent a serious problem   that is an emergency. Do not wait to see if the symptoms will go away. Get medical help right away. Call your local emergency services (911 in the [place] Do not drive yourself to the hospital. Summary  After the procedure, it is common to have bruising and tenderness at the site.  Follow instructions from your health care provider about how to take care of your radial site wound. Check the wound every day for signs of infection.  Do not lift anything that is heavier than 10 lb (4.5 kg), or the limit that you are told, until your health care provider says  that it is safe. This information is not intended to replace advice given to you by your health care provider. Make sure you discuss any questions you have with your health care provider. Document Revised: 11/22/2017 Document Reviewed: 11/22/2017 Elsevier Patient Education  2020 [employer]  

## 2020-03-24 ENCOUNTER — Telehealth: Payer: Self-pay | Admitting: Cardiology

## 2020-03-24 NOTE — Telephone Encounter (Signed)
Pt was just calling in to ask if she had to fast prior to her scheduled echo for tomorrow.  Informed the pt that she does NOT have to fast for an echo, as well as she doesn't have to hold any medications. Pt verbalized understanding and agrees with this plan.  Pt was more than gracious for all the assistance provided.

## 2020-03-24 NOTE — Telephone Encounter (Signed)
Attempted to call the pt back x 2 and both times phone was busy.

## 2020-03-24 NOTE — Telephone Encounter (Signed)
Patient states she has questions in regards to her echocardiogram scheduled for tomorrow, 03/25/20 at 1:50 PM. Please  Call.

## 2020-03-25 ENCOUNTER — Ambulatory Visit (HOSPITAL_COMMUNITY): Payer: Medicare Other | Attending: Cardiology

## 2020-03-25 ENCOUNTER — Other Ambulatory Visit: Payer: Self-pay

## 2020-03-25 DIAGNOSIS — R079 Chest pain, unspecified: Secondary | ICD-10-CM | POA: Insufficient documentation

## 2020-03-26 NOTE — Progress Notes (Addendum)
Cardiology Office Note:    Date:  03/27/2020   ID:  Carmen Fischer, DOB 1940-11-16, MRN [id number]  PCP:  Gaynelle Arabian, MD  Cardiologist:  Ena Dawley, MD   Electrophysiologist:  None   Referring MD: Gaynelle Arabian, MD   Chief Complaint:  Follow-up (Status post cardiac catheterization)    Patient Profile:    Carmen Fischer is a 79 y.o. female with:   Hypertension   Hyperlipidemia  FHx of CAD  Cardiac catheterization 02/2020: no CAD  Echocardiogram 02/2020: EF 60-65, RVSP 29.4   Prior CV studies: Echocardiogram 03/25/2020 EF 60-65, no RWMA, mild LVH, normal RVSF, RVSP 29.4, trivial MR  Cardiac catheterization 03/10/2020 Normal coronary arteries Normal LVF Normal LVEDP   History of Present Illness:    [name] was recently evaluated by Dr. Meda Fischer for exertional chest pain and shortness of breath c/w progressive angina. Cardiac catheterization was arranged and demonstrated normal coronary arteries.  LVEDP was normal.  An echocardiogram done yesterday demonstrated normal EF and normal RVSP.  She returns for follow up.  She is here alone.  She has not had further chest pain. Her breathing is overall stable.  She has not had significant leg swelling.  She does note some abdominal bloating.     Past Medical History:  Diagnosis Date  . Glaucoma   . High cholesterol   . High cholesterol   . Hypertension     Current Medications: Current Meds  Medication Sig  . aspirin EC 81 MG tablet Take 81 mg by mouth daily.  . cycloSPORINE (RESTASIS) 0.05 % ophthalmic emulsion Place 1 drop into both eyes 2 (two) times daily.  Marland Kitchen desloratadine (CLARINEX) 5 MG tablet Take 5 mg by mouth daily.  . hydrochlorothiazide (HYDRODIURIL) 25 MG tablet Take 25 mg by mouth daily.  . Multiple Vitamin (MULTIVITAMIN WITH MINERALS) TABS tablet Take 1 tablet by mouth daily.  Marland Kitchen omeprazole (PRILOSEC) 20 MG capsule Take 20 mg by mouth daily.  Marland Kitchen OVER THE COUNTER MEDICATION Take 1 tablet by mouth daily.  Hydroeye Vitamin  . polyethylene glycol (MIRALAX / GLYCOLAX) 17 g packet Take 17 g by mouth daily as needed for moderate constipation.   . simvastatin (ZOCOR) 20 MG tablet Take 20 mg by mouth every evening.   Marland Kitchen spironolactone (ALDACTONE) 25 MG tablet Take 0.5 tablets (12.5 mg total) by mouth daily.  . Travoprost, BAK Free, (TRAVATAN Z) 0.004 % SOLN ophthalmic solution Place 1 drop into both eyes at bedtime.     Allergies:   Shellfish allergy   Social History   Tobacco Use  . Smoking status: Never Smoker  . Smokeless tobacco: Never Used  Substance Use Topics  . Alcohol use: No  . Drug use: Never     Family Hx: The patient's family history includes Heart attack in her father and mother.  Review of Systems  Gastrointestinal: Positive for bloating.     EKGs/Labs/Other Test Reviewed:    EKG:  EKG is not ordered today.  The ekg ordered today demonstrates n/a  Recent Labs: 02/08/2020: ALT 19 03/04/2020: BUN 10; Creatinine, Ser 0.78; Hemoglobin 13.9; Platelets 269; Potassium 3.4; Sodium 143   Recent Lipid Panel No results found for: CHOL, TRIG, HDL, CHOLHDL, LDLCALC, LDLDIRECT  Physical Exam:    VS:  BP 130/76   Pulse 65   Ht 5\' 3"  (1.6 m)   Wt 177 lb 0.3 oz (80.3 kg)   SpO2 97%   BMI 31.36 kg/m     Wt Readings from Last  3 Encounters:  03/27/20 177 lb 0.3 oz (80.3 kg)  03/10/20 175 lb (79.4 kg)  03/04/20 179 lb 6.4 oz (81.4 kg)     Constitutional:      Appearance: Healthy appearance. Not in distress.  Neck:     Thyroid: No thyromegaly.     Vascular: JVD normal.  Pulmonary:     Effort: Pulmonary effort is normal.     Breath sounds: No wheezing. No rales.  Cardiovascular:     Normal rate. Regular rhythm. Normal S1. Normal S2.     Murmurs: There is no murmur.  Edema:    Peripheral edema absent.  Abdominal:     Palpations: Abdomen is soft. There is no hepatomegaly.  Skin:    General: Skin is warm and dry.  Neurological:     Mental Status: Alert and oriented to  person, place and time.     Cranial Nerves: Cranial nerves are intact.      ASSESSMENT & PLAN:    1. Essential hypertension 2. Hypokalemia Fair control.  Continue current medical regimen.  Recent K+ was low and she was started on Spironolactone.  Repeat BMET today.    3. Chest pain of uncertain etiology Her cardiac catheterization demonstrated normal coronary arteries.  Her echocardiogram showed normal ejection fraction  She had normal filling pressures at cardiac catheterization.  She thinks she has felt better since starting on Spironolactone.  This raises the question of mild volume excess contributing to her symptoms.  She also has a lot of abdominal bloating which could indicate a GI source.  I have advised her to try OTC simethicone.  If this does not improve, she can follow up with her PCP for further management.     Dispo:  Return in about 1 year (around 03/27/2021) for Routine Follow Up w/ Dr. Meda Fischer, in person.   Medication Adjustments/Labs and Tests Ordered: Current medicines are reviewed at length with the patient today.  Concerns regarding medicines are outlined above.  Tests Ordered: Orders Placed This Encounter  Procedures  . Basic metabolic panel   Medication Changes: No orders of the defined types were placed in this encounter.   Signed, Richardson Dopp, PA-C  03/27/2020 9:50 AM    [facility] [address], [place], [place]  [address] Phone: [phone]; Fax: [phone]

## 2020-03-27 ENCOUNTER — Encounter: Payer: Self-pay | Admitting: Physician Assistant

## 2020-03-27 ENCOUNTER — Ambulatory Visit (INDEPENDENT_AMBULATORY_CARE_PROVIDER_SITE_OTHER): Payer: Medicare Other | Admitting: Physician Assistant

## 2020-03-27 ENCOUNTER — Other Ambulatory Visit: Payer: Self-pay

## 2020-03-27 VITALS — BP 130/76 | HR 65 | Ht 63.0 in | Wt 177.0 lb

## 2020-03-27 DIAGNOSIS — I10 Essential (primary) hypertension: Secondary | ICD-10-CM

## 2020-03-27 DIAGNOSIS — E876 Hypokalemia: Secondary | ICD-10-CM

## 2020-03-27 DIAGNOSIS — R079 Chest pain, unspecified: Secondary | ICD-10-CM

## 2020-03-27 LAB — BASIC METABOLIC PANEL
BUN/Creatinine Ratio: 14 (ref 12–28)
BUN: 12 mg/dL (ref 8–27)
CO2: 27 mmol/L (ref 20–29)
Calcium: 10 mg/dL (ref 8.7–10.3)
Chloride: 96 mmol/L (ref 96–106)
Creatinine, Ser: 0.84 mg/dL (ref 0.57–1.00)
GFR calc Af Amer: 75 mL/min/{1.73_m2} (ref >59)
GFR calc non Af Amer: 65 mL/min/{1.73_m2} (ref >59)
Glucose: 85 mg/dL (ref 65–99)
Potassium: 3.6 mmol/L (ref 3.5–5.2)
Sodium: 138 mmol/L (ref 134–144)

## 2020-03-27 NOTE — Patient Instructions (Signed)
Medication Instructions:   Your physician recommends that you continue on your current medications as directed. Please refer to the Current Medication list given to you today.  *If you need a refill on your cardiac medications before your next appointment, please call your pharmacy*  Lab Work:  You will have labs drawn today: BMET  If you have labs (blood work) drawn today and your tests are completely normal, you will receive your results only by: Marland Kitchen MyChart Message (if you have MyChart) OR . A paper copy in the mail If you have any lab test that is abnormal or we need to change your treatment, we will call you to review the results.  Testing/Procedures:  None ordered today  Follow-Up: At [facility], you and your health needs are our priority.  As part of our continuing mission to provide you with exceptional heart care, we have created designated Provider Care Teams.  These Care Teams include your primary Cardiologist (physician) and Advanced Practice Providers (APPs -  Physician Assistants and Nurse Practitioners) who all work together to provide you with the care you need, when you need it.  We recommend signing up for the patient portal called "MyChart".  Sign up information is provided on this After Visit Summary.  MyChart is used to connect with patients for Virtual Visits (Telemedicine).  Patients are able to view lab/test results, encounter notes, upcoming appointments, etc.  Non-urgent messages can be sent to your provider as well.   To learn more about what you can do with MyChart, go to [email or web address].    Your next appointment:   12 month(s)  The format for your next appointment:   In Person  Provider:   Ena Dawley, MD

## 2020-05-26 DIAGNOSIS — K5900 Constipation, unspecified: Secondary | ICD-10-CM | POA: Diagnosis not present

## 2020-05-26 DIAGNOSIS — I10 Essential (primary) hypertension: Secondary | ICD-10-CM | POA: Diagnosis not present

## 2020-05-26 DIAGNOSIS — K219 Gastro-esophageal reflux disease without esophagitis: Secondary | ICD-10-CM | POA: Diagnosis not present

## 2020-05-26 DIAGNOSIS — H409 Unspecified glaucoma: Secondary | ICD-10-CM | POA: Diagnosis not present

## 2020-05-26 DIAGNOSIS — E7800 Pure hypercholesterolemia, unspecified: Secondary | ICD-10-CM | POA: Diagnosis not present

## 2020-05-26 DIAGNOSIS — J309 Allergic rhinitis, unspecified: Secondary | ICD-10-CM | POA: Diagnosis not present

## 2020-05-26 DIAGNOSIS — Z0000 Encounter for general adult medical examination without abnormal findings: Secondary | ICD-10-CM | POA: Diagnosis not present

## 2020-05-26 DIAGNOSIS — Z1389 Encounter for screening for other disorder: Secondary | ICD-10-CM | POA: Diagnosis not present

## 2020-06-24 DIAGNOSIS — E876 Hypokalemia: Secondary | ICD-10-CM | POA: Diagnosis not present

## 2020-06-26 DIAGNOSIS — Z209 Contact with and (suspected) exposure to unspecified communicable disease: Secondary | ICD-10-CM | POA: Diagnosis not present

## 2020-07-08 DIAGNOSIS — Z23 Encounter for immunization: Secondary | ICD-10-CM | POA: Diagnosis not present

## 2020-08-04 DIAGNOSIS — M79669 Pain in unspecified lower leg: Secondary | ICD-10-CM | POA: Diagnosis not present

## 2020-09-02 DIAGNOSIS — Z961 Presence of intraocular lens: Secondary | ICD-10-CM | POA: Diagnosis not present

## 2020-09-02 DIAGNOSIS — H40023 Open angle with borderline findings, high risk, bilateral: Secondary | ICD-10-CM | POA: Diagnosis not present

## 2020-09-02 DIAGNOSIS — H04123 Dry eye syndrome of bilateral lacrimal glands: Secondary | ICD-10-CM | POA: Diagnosis not present

## 2020-09-11 DIAGNOSIS — M79605 Pain in left leg: Secondary | ICD-10-CM | POA: Diagnosis not present

## 2020-09-14 ENCOUNTER — Emergency Department (HOSPITAL_COMMUNITY)
Admission: EM | Admit: 2020-09-14 | Discharge: 2020-09-15 | Disposition: A | Discharge status: Home / Self Care | Payer: Medicare Other | Attending: Emergency Medicine | Admitting: Emergency Medicine

## 2020-09-14 ENCOUNTER — Encounter (HOSPITAL_COMMUNITY): Payer: Self-pay

## 2020-09-14 ENCOUNTER — Other Ambulatory Visit: Payer: Self-pay

## 2020-09-14 ENCOUNTER — Emergency Department (HOSPITAL_COMMUNITY): Payer: Medicare Other

## 2020-09-14 DIAGNOSIS — G4489 Other headache syndrome: Secondary | ICD-10-CM

## 2020-09-14 DIAGNOSIS — R0789 Other chest pain: Secondary | ICD-10-CM | POA: Insufficient documentation

## 2020-09-14 DIAGNOSIS — Z79899 Other long term (current) drug therapy: Secondary | ICD-10-CM | POA: Diagnosis not present

## 2020-09-14 DIAGNOSIS — R079 Chest pain, unspecified: Secondary | ICD-10-CM | POA: Diagnosis not present

## 2020-09-14 DIAGNOSIS — I10 Essential (primary) hypertension: Secondary | ICD-10-CM | POA: Diagnosis not present

## 2020-09-14 DIAGNOSIS — K219 Gastro-esophageal reflux disease without esophagitis: Secondary | ICD-10-CM | POA: Diagnosis not present

## 2020-09-14 HISTORY — DX: Unspecified osteoarthritis, unspecified site: M19.90

## 2020-09-14 LAB — BASIC METABOLIC PANEL
Anion gap: 12 (ref 5–15)
BUN: 10 mg/dL (ref 8–23)
CO2: 29 mmol/L (ref 22–32)
Calcium: 9.8 mg/dL (ref 8.9–10.3)
Chloride: 94 mmol/L — ABNORMAL LOW (ref 98–111)
Creatinine, Ser: 0.96 mg/dL (ref 0.44–1.00)
GFR, Estimated: 59 mL/min — ABNORMAL LOW (ref >60)
Glucose, Bld: 110 mg/dL — ABNORMAL HIGH (ref 70–99)
Potassium: 4 mmol/L (ref 3.5–5.1)
Sodium: 135 mmol/L (ref 135–145)

## 2020-09-14 LAB — CBC
HCT: 42 % (ref 36.0–46.0)
Hemoglobin: 13.6 g/dL (ref 12.0–15.0)
MCH: 27.5 pg (ref 26.0–34.0)
MCHC: 32.4 g/dL (ref 30.0–36.0)
MCV: 85 fL (ref 80.0–100.0)
Platelets: 281 10*3/uL (ref 150–400)
RBC: 4.94 MIL/uL (ref 3.87–5.11)
RDW: 15.1 % (ref 11.5–15.5)
WBC: 9.1 10*3/uL (ref 4.0–10.5)
nRBC: 0 % (ref 0.0–0.2)

## 2020-09-14 LAB — TROPONIN I (HIGH SENSITIVITY)
Troponin I (High Sensitivity): 4 ng/L (ref <18)
Troponin I (High Sensitivity): 6 ng/L (ref <18)

## 2020-09-14 NOTE — ED Triage Notes (Addendum)
Pt reports having high blood pressure. Pt took blood pressure at noon and reports and it being 196/90. Pt reports having chest pain (pressure in middle of chest), and throbbing in her head. Pt also reports feeling off balance.

## 2020-09-15 MED ORDER — ACETAMINOPHEN 325 MG PO TABS
650.0000 mg | ORAL_TABLET | Freq: Once | ORAL | Status: AC
  Administered 2020-09-15: 650 mg via ORAL
  Filled 2020-09-15: qty 2

## 2020-09-15 NOTE — ED Provider Notes (Signed)
[facility] EMERGENCY DEPARTMENT Provider Note   CSN: [id number] Arrival date & time: 09/14/20  1403     History Chief Complaint  Patient presents with  . Hypertension  . Chest Pain    Carmen Fischer is a 79 y.o. female.  The history is provided by the patient.  Hypertension This is a new problem. The current episode started 12 to 24 hours ago. The problem occurs constantly. The problem has been gradually improving. Associated symptoms include chest pain and headaches. Exacerbated by: Unknown. Nothing relieves the symptoms.  Chest Pain Associated symptoms: headache   Associated symptoms: no fever   Patient with history of hypertension, hyperlipidemia presents with elevated blood pressure.  Patient reports she usually has normal blood pressures with systolic at 884. Today she noted blood pressure is 196/90.  She reports after taking her blood pressure, she began having chest pressure without shortness of breath that has since resolved.  She also has a "throbbing "in her head No focal weakness.  Her blood pressure is improving and her headache is now improving.  Her chest pressure was very brief and she is currently chest pain-free. Patient reports medication compliance.  She was recently placed on a prednisone taper by her PCP for joint pain She is concerned this may be causing elevation of blood pressure     Past Medical History:  Diagnosis Date  . Arthritis   . Glaucoma   . High cholesterol   . High cholesterol   . Hypertension     Patient Active Problem List   Diagnosis Date Noted  . Chest pain 12/22/2012  . GERD (gastroesophageal reflux disease) 12/22/2012  . Hypertension 12/22/2012  . High cholesterol     Past Surgical History:  Procedure Laterality Date  . ABDOMINAL HYSTERECTOMY    . CHOLECYSTECTOMY    . LEFT HEART CATH AND CORONARY ANGIOGRAPHY N/A 03/10/2020   Procedure: LEFT HEART CATH AND CORONARY ANGIOGRAPHY;  Surgeon: Martinique, Peter M, MD;   Location: [facility];  Service: Cardiovascular;  Laterality: N/A;     OB History   No obstetric history on file.     Family History  Problem Relation Age of Onset  . Heart attack Mother   . Heart attack Father     Social History   Tobacco Use  . Smoking status: Never Smoker  . Smokeless tobacco: Never Used  Substance Use Topics  . Alcohol use: No  . Drug use: Never    Home Medications Prior to Admission medications   Medication Sig Start Date End Date Taking? Authorizing Provider  aspirin EC 81 MG tablet Take 81 mg by mouth daily.    [provider]  cycloSPORINE (RESTASIS) 0.05 % ophthalmic emulsion Place 1 drop into both eyes 2 (two) times daily.    [provider]  desloratadine (CLARINEX) 5 MG tablet Take 5 mg by mouth daily. 02/13/20   [provider]  hydrochlorothiazide (HYDRODIURIL) 25 MG tablet Take 25 mg by mouth daily.    [provider]  Multiple Vitamin (MULTIVITAMIN WITH MINERALS) TABS tablet Take 1 tablet by mouth daily.    [provider]  omeprazole (PRILOSEC) 20 MG capsule Take 20 mg by mouth daily.    [provider]  OVER THE COUNTER MEDICATION Take 1 tablet by mouth daily. Hydroeye Vitamin    [provider]  polyethylene glycol (MIRALAX / GLYCOLAX) 17 g packet Take 17 g by mouth daily as needed for moderate constipation.  [provider]  simvastatin (ZOCOR) 20 MG tablet Take 20 mg by mouth every evening.     [provider]  spironolactone (ALDACTONE) 25 MG tablet Take 0.5 tablets (12.5 mg total) by mouth daily. 03/04/20   Carmen Spark, MD  Travoprost, BAK Free, (TRAVATAN Z) 0.004 % SOLN ophthalmic solution Place 1 drop into both eyes at bedtime.    [provider]    Allergies    Shellfish allergy  Review of Systems   Review of Systems  Constitutional: Negative for fever.  Cardiovascular: Positive for chest pain.  Neurological: Positive for  headaches.  All other systems reviewed and are negative.   Physical Exam Updated Vital Signs BP (!) 148/72 (BP Location: Left Arm)   Pulse (!) 57   Temp 98 F (36.7 C) (Oral)   Resp 16   Ht 1.575 m (5\' 2" )   Wt 82.1 kg   SpO2 100%   BMI 33.11 kg/m   Physical Exam CONSTITUTIONAL: Well developed/well nourished, appears younger than stated age HEAD: Normocephalic/atraumatic EYES: EOMI/PERRL, no nystagmus,  no ptosis ENMT: Mucous membranes moist NECK: supple no meningeal signs, no bruits CV: S1/S2 noted, no murmurs/rubs/gallops noted LUNGS: Lungs are clear to auscultation bilaterally, no apparent distress ABDOMEN: soft, nontender, no rebound or guarding GU:no cva tenderness NEURO:Awake/alert, face symmetric, no arm or leg drift is noted Equal 5/5 strength with shoulder abduction, elbow flex/extension, wrist flex/extension in upper extremities and equal hand grips bilaterally Equal 5/5 strength with hip flexion,knee flex/extension, foot dorsi/plantar flexion Cranial nerves 3/4/5/6/05/08/09/11/12 tested and intact Gait normal without ataxia No past pointing Sensation to light touch intact in all extremities EXTREMITIES: pulses normal, full ROM SKIN: warm, color normal PSYCH: no abnormalities of mood noted   ED Results / Procedures / Treatments   Labs (all labs ordered are listed, but only abnormal results are displayed) Labs Reviewed  BASIC METABOLIC PANEL - Abnormal; Notable for the following components:      Result Value   Chloride 94 (*)    Glucose, Bld 110 (*)    GFR, Estimated 59 (*)    All other components within normal limits  CBC  TROPONIN I (HIGH SENSITIVITY)  TROPONIN I (HIGH SENSITIVITY)    EKG EKG Interpretation  Date/Time:  Tuesday September 15 2020 00:55:09 EST Ventricular Rate:  57 PR Interval:  150 QRS Duration: 78 QT Interval:  422 QTC Calculation: 410 R Axis:   -11 Text Interpretation: Sinus bradycardia Otherwise normal ECG Confirmed by  Ripley Fraise [phone]) on 09/15/2020 1:29:03 AM   Radiology DG Chest 2 View  Result Date: 09/14/2020 CLINICAL DATA:  Chest pain EXAM: CHEST - 2 VIEW COMPARISON:  None. FINDINGS: The heart size and mediastinal contours are within normal limits. Aortic knob calcifications are seen. Both lungs are clear. The visualized skeletal structures are unremarkable. IMPRESSION: No active cardiopulmonary disease. Electronically Signed   By: Prudencio Pair M.D.   On: 09/14/2020 15:04    Procedures Procedures  Medications Ordered in ED Medications  acetaminophen (TYLENOL) tablet 650 mg (650 mg Oral Given 09/15/20 0119)    ED Course  I have reviewed the triage vital signs and the nursing notes.  Pertinent labs & imaging results that were available during my care of the patient were reviewed by me and considered in my medical decision making (see chart for details).    MDM Rules/Calculators/A&P  Patient presented mostly for her evaluation of high blood pressure.  She was concerned that this may be caused by the prednisone taper that she was on. It is possible that the prednisone was contributing to hypertension.  Her pressures are now improving.  She is in no acute distress, she is ambulatory, no focal neuro deficits She reports her chest pain was very brief and occurred several hours ago.  She had a cardiac cath in May 2021 that showed no CAD. I have low suspicion for ACS at this time. Patient will stop her prednisone, continue blood pressure medicine Final Clinical Impression(s) / ED Diagnoses Final diagnoses:  Other headache syndrome  Chest pain, unspecified type  Primary hypertension    Rx / DC Orders ED Discharge Orders    None       Ripley Fraise, MD 09/15/20 0201

## 2020-09-18 DIAGNOSIS — M79605 Pain in left leg: Secondary | ICD-10-CM | POA: Diagnosis not present

## 2020-09-18 DIAGNOSIS — R519 Headache, unspecified: Secondary | ICD-10-CM | POA: Diagnosis not present

## 2020-09-18 DIAGNOSIS — I10 Essential (primary) hypertension: Secondary | ICD-10-CM | POA: Diagnosis not present

## 2020-09-23 DIAGNOSIS — M1712 Unilateral primary osteoarthritis, left knee: Secondary | ICD-10-CM | POA: Diagnosis not present

## 2020-09-23 DIAGNOSIS — M79662 Pain in left lower leg: Secondary | ICD-10-CM | POA: Diagnosis not present

## 2020-10-07 DIAGNOSIS — M25562 Pain in left knee: Secondary | ICD-10-CM | POA: Diagnosis not present

## 2020-10-07 DIAGNOSIS — M1712 Unilateral primary osteoarthritis, left knee: Secondary | ICD-10-CM | POA: Diagnosis not present

## 2020-11-26 DIAGNOSIS — J309 Allergic rhinitis, unspecified: Secondary | ICD-10-CM | POA: Diagnosis not present

## 2020-11-26 DIAGNOSIS — K5900 Constipation, unspecified: Secondary | ICD-10-CM | POA: Diagnosis not present

## 2020-11-26 DIAGNOSIS — I10 Essential (primary) hypertension: Secondary | ICD-10-CM | POA: Diagnosis not present

## 2020-11-26 DIAGNOSIS — E7800 Pure hypercholesterolemia, unspecified: Secondary | ICD-10-CM | POA: Diagnosis not present

## 2020-11-26 DIAGNOSIS — H409 Unspecified glaucoma: Secondary | ICD-10-CM | POA: Diagnosis not present

## 2020-11-26 DIAGNOSIS — K219 Gastro-esophageal reflux disease without esophagitis: Secondary | ICD-10-CM | POA: Diagnosis not present

## 2021-01-25 ENCOUNTER — Other Ambulatory Visit: Payer: Self-pay | Admitting: Family Medicine

## 2021-01-25 DIAGNOSIS — Z1231 Encounter for screening mammogram for malignant neoplasm of breast: Secondary | ICD-10-CM

## 2021-01-26 DIAGNOSIS — R5383 Other fatigue: Secondary | ICD-10-CM | POA: Diagnosis not present

## 2021-01-26 DIAGNOSIS — R42 Dizziness and giddiness: Secondary | ICD-10-CM | POA: Diagnosis not present

## 2021-01-27 ENCOUNTER — Other Ambulatory Visit: Payer: Self-pay

## 2021-01-27 ENCOUNTER — Ambulatory Visit
Admission: RE | Admit: 2021-01-27 | Discharge: 2021-01-27 | Disposition: A | Discharge status: Home / Self Care | Payer: Medicare Other | Source: Ambulatory Visit | Attending: Family Medicine | Admitting: Family Medicine

## 2021-01-27 DIAGNOSIS — Z1231 Encounter for screening mammogram for malignant neoplasm of breast: Secondary | ICD-10-CM | POA: Diagnosis not present

## 2021-02-02 DIAGNOSIS — R42 Dizziness and giddiness: Secondary | ICD-10-CM | POA: Diagnosis not present

## 2021-02-11 ENCOUNTER — Ambulatory Visit: Payer: Medicare Other | Attending: Internal Medicine

## 2021-02-11 ENCOUNTER — Other Ambulatory Visit: Payer: Self-pay

## 2021-02-11 DIAGNOSIS — Z23 Encounter for immunization: Secondary | ICD-10-CM

## 2021-02-11 NOTE — Progress Notes (Signed)
   Covid-19 Vaccination Clinic  Name:  Carmen Fischer    MRN: [id number] DOB: 09-05-1941  02/11/2021  [name] was observed post Covid-19 immunization for 15 minutes without incident. She was provided with Vaccine Information Sheet and instruction to access the V-Safe system.   [name] was instructed to call 911 with any severe reactions post vaccine: Marland Kitchen Difficulty breathing  . Swelling of face and throat  . A fast heartbeat  . A bad rash all over body  . Dizziness and weakness   Immunizations Administered    Name Date Dose VIS Date Route   Moderna Covid-19 Booster Vaccine 02/11/2021 10:40 AM 0.25 mL 08/19/2020 Intramuscular   Manufacturer: Moderna   Lot: [id number]   [place]: [phone]

## 2021-02-12 ENCOUNTER — Other Ambulatory Visit (HOSPITAL_BASED_OUTPATIENT_CLINIC_OR_DEPARTMENT_OTHER): Payer: Self-pay

## 2021-02-12 MED ORDER — COVID-19 MRNA VACC (MODERNA) 100 MCG/0.5ML IM SUSP
INTRAMUSCULAR | 0 refills | Status: DC
  Filled 2021-02-12: qty 0.25, 1d supply, fill #0

## 2021-02-19 DIAGNOSIS — R42 Dizziness and giddiness: Secondary | ICD-10-CM | POA: Diagnosis not present

## 2021-03-18 ENCOUNTER — Encounter (HOSPITAL_BASED_OUTPATIENT_CLINIC_OR_DEPARTMENT_OTHER): Payer: Self-pay | Admitting: *Deleted

## 2021-03-18 ENCOUNTER — Emergency Department (HOSPITAL_BASED_OUTPATIENT_CLINIC_OR_DEPARTMENT_OTHER)
Admission: EM | Admit: 2021-03-18 | Discharge: 2021-03-18 | Disposition: A | Discharge status: Home / Self Care | Payer: Medicare Other | Attending: Emergency Medicine | Admitting: Emergency Medicine

## 2021-03-18 ENCOUNTER — Other Ambulatory Visit: Payer: Self-pay

## 2021-03-18 ENCOUNTER — Emergency Department (HOSPITAL_BASED_OUTPATIENT_CLINIC_OR_DEPARTMENT_OTHER): Payer: Medicare Other | Admitting: Radiology

## 2021-03-18 DIAGNOSIS — R079 Chest pain, unspecified: Secondary | ICD-10-CM | POA: Diagnosis not present

## 2021-03-18 DIAGNOSIS — Z7982 Long term (current) use of aspirin: Secondary | ICD-10-CM | POA: Insufficient documentation

## 2021-03-18 DIAGNOSIS — Z79899 Other long term (current) drug therapy: Secondary | ICD-10-CM | POA: Insufficient documentation

## 2021-03-18 DIAGNOSIS — H6121 Impacted cerumen, right ear: Secondary | ICD-10-CM | POA: Insufficient documentation

## 2021-03-18 DIAGNOSIS — R519 Headache, unspecified: Secondary | ICD-10-CM

## 2021-03-18 DIAGNOSIS — I10 Essential (primary) hypertension: Secondary | ICD-10-CM | POA: Insufficient documentation

## 2021-03-18 DIAGNOSIS — R0789 Other chest pain: Secondary | ICD-10-CM | POA: Diagnosis not present

## 2021-03-18 LAB — CBC
HCT: 38.7 % (ref 36.0–46.0)
Hemoglobin: 12.7 g/dL (ref 12.0–15.0)
MCH: 27.7 pg (ref 26.0–34.0)
MCHC: 32.8 g/dL (ref 30.0–36.0)
MCV: 84.3 fL (ref 80.0–100.0)
Platelets: 271 10*3/uL (ref 150–400)
RBC: 4.59 MIL/uL (ref 3.87–5.11)
RDW: 14.2 % (ref 11.5–15.5)
WBC: 5.2 10*3/uL (ref 4.0–10.5)
nRBC: 0 % (ref 0.0–0.2)

## 2021-03-18 LAB — BASIC METABOLIC PANEL
Anion gap: 10 (ref 5–15)
BUN: 14 mg/dL (ref 8–23)
CO2: 29 mmol/L (ref 22–32)
Calcium: 9.4 mg/dL (ref 8.9–10.3)
Chloride: 92 mmol/L — ABNORMAL LOW (ref 98–111)
Creatinine, Ser: 0.88 mg/dL (ref 0.44–1.00)
GFR, Estimated: >60 mL/min (ref >60)
Glucose, Bld: 81 mg/dL (ref 70–99)
Potassium: 3.6 mmol/L (ref 3.5–5.1)
Sodium: 131 mmol/L — ABNORMAL LOW (ref 135–145)

## 2021-03-18 LAB — TROPONIN I (HIGH SENSITIVITY): Troponin I (High Sensitivity): 2 ng/L (ref <18)

## 2021-03-18 MED ORDER — SODIUM CHLORIDE 0.9 % IV BOLUS
500.0000 mL | Freq: Once | INTRAVENOUS | Status: AC
  Administered 2021-03-18: 500 mL via INTRAVENOUS

## 2021-03-18 NOTE — ED Notes (Signed)
Back from Xray.

## 2021-03-18 NOTE — Discharge Instructions (Signed)
Follow up with your doctor

## 2021-03-18 NOTE — ED Provider Notes (Signed)
[facility] Provider Note   CSN: [id number] Arrival date & time: 03/18/21  1554     History Chief Complaint  Patient presents with  . Chest Pain  . Headache    Carmen Fischer is a 80 y.o. female.  HPI Patient presents with chest pain headache and dizziness.  Began today.  Pain is dull in her chest.  Like pain she had before.  Not exertional.  No fevers.  No cough.  No shortness of breath. Also has a dull frontal headache.  Similar to headache she has had before.  No lateralizing numbness weakness.  States she does feel little dizzy.  No ringing in her ears.  No difficulty hearing.  No confusion.  No vision changes.  Able ambulate without difficulty.    Past Medical History:  Diagnosis Date  . Arthritis   . Glaucoma   . High cholesterol   . High cholesterol   . Hypertension     Patient Active Problem List   Diagnosis Date Noted  . Chest pain 12/22/2012  . GERD (gastroesophageal reflux disease) 12/22/2012  . Hypertension 12/22/2012  . High cholesterol     Past Surgical History:  Procedure Laterality Date  . ABDOMINAL HYSTERECTOMY    . CHOLECYSTECTOMY    . LEFT HEART CATH AND CORONARY ANGIOGRAPHY N/A 03/10/2020   Procedure: LEFT HEART CATH AND CORONARY ANGIOGRAPHY;  Surgeon: Martinique, Peter M, MD;  Location: [facility];  Service: Cardiovascular;  Laterality: N/A;     OB History    Gravida  2   Para  2   Term      Preterm      AB      Living        SAB      IAB      Ectopic      Multiple      Live Births              Family History  Problem Relation Age of Onset  . Heart attack Mother   . Heart attack Father     Social History   Tobacco Use  . Smoking status: Never Smoker  . Smokeless tobacco: Never Used  Substance Use Topics  . Alcohol use: Yes    Comment: rarely  . Drug use: Never    Home Medications Prior to Admission medications   Medication Sig Start Date End Date Taking? Authorizing  Provider  aspirin EC 81 MG tablet Take 81 mg by mouth daily.   Yes [provider]  cycloSPORINE (RESTASIS) 0.05 % ophthalmic emulsion Place 1 drop into both eyes 2 (two) times daily.   Yes [provider]  desloratadine (CLARINEX) 5 MG tablet Take 5 mg by mouth daily. 02/13/20  Yes [provider]  hydrochlorothiazide (HYDRODIURIL) 25 MG tablet Take 25 mg by mouth daily.   Yes [provider]  Multiple Vitamin (MULTIVITAMIN WITH MINERALS) TABS tablet Take 1 tablet by mouth daily.   Yes [provider]  omeprazole (PRILOSEC) 20 MG capsule Take 20 mg by mouth daily.   Yes [provider]  polyethylene glycol (MIRALAX / GLYCOLAX) 17 g packet Take 17 g by mouth daily as needed for moderate constipation.    Yes [provider]  simvastatin (ZOCOR) 20 MG tablet Take 20 mg by mouth every evening.    Yes [provider]  spironolactone (ALDACTONE) 25 MG tablet Take 0.5 tablets (12.5 mg total) by mouth daily. 03/04/20  Yes  Dorothy Spark, MD  Travoprost, BAK Free, (TRAVATAN) 0.004 % SOLN ophthalmic solution Place 1 drop into both eyes at bedtime.   Yes [provider]  COVID-19 mRNA vaccine, Moderna, 100 MCG/0.5ML injection Inject into the muscle. 02/11/21   Carlyle Basques, MD  OVER THE COUNTER MEDICATION Take 1 tablet by mouth daily. Hydroeye Vitamin    [provider]    Allergies    Shellfish allergy and Prednisone  Review of Systems   Review of Systems  Constitutional: Negative for appetite change.  HENT: Negative for congestion, ear pain, sore throat and voice change.   Respiratory: Negative for shortness of breath.   Cardiovascular: Positive for chest pain. Negative for leg swelling.  Gastrointestinal: Negative for abdominal pain.  Genitourinary: Negative for flank pain.  Musculoskeletal: Negative for back pain.  Skin: Negative for rash.  Neurological: Positive for dizziness and headaches.   Psychiatric/Behavioral: Negative for confusion.    Physical Exam Updated Vital Signs BP 140/65   Pulse 61   Temp 98.2 F (36.8 C) (Oral)   Resp 20   Ht 5\' 3"  (1.6 m)   Wt 81.6 kg   SpO2 100%   BMI 31.89 kg/m   Physical Exam Vitals and nursing note reviewed.  Constitutional:      Appearance: She is well-developed.  HENT:     Head: Normocephalic and atraumatic.     Comments: Left TM normal.  Right TM obscured by cerumen.  Mild nystagmus with gaze to the left. Cardiovascular:     Rate and Rhythm: Normal rate.  Pulmonary:     Breath sounds: No wheezing, rhonchi or rales.  Chest:     Chest wall: No tenderness.  Abdominal:     Tenderness: There is no abdominal tenderness.  Musculoskeletal:     Right lower leg: No edema.     Left lower leg: No edema.  Skin:    General: Skin is warm.     Capillary Refill: Capillary refill takes less than 2 seconds.  Neurological:     Mental Status: She is oriented to person, place, and time.     ED Results / Procedures / Treatments   Labs (all labs ordered are listed, but only abnormal results are displayed) Labs Reviewed  BASIC METABOLIC PANEL - Abnormal; Notable for the following components:      Result Value   Sodium 131 (*)    Chloride 92 (*)    All other components within normal limits  CBC  TROPONIN I (HIGH SENSITIVITY)    EKG EKG Interpretation  Date/Time:  Thursday Mar 18 2021 16:04:32 EDT Ventricular Rate:  65 PR Interval:  153 QRS Duration: 91 QT Interval:  444 QTC Calculation: 462 R Axis:   -33 Text Interpretation: Sinus rhythm Left axis deviation Abnormal R-wave progression, early transition No significant change since last tracing Confirmed by Davonna Belling [phone]) on 03/18/2021 4:08:27 PM   Radiology DG Chest 2 View  Result Date: 03/18/2021 CLINICAL DATA:  Chest pain EXAM: CHEST - 2 VIEW COMPARISON:  09/14/2020 FINDINGS: The heart size and mediastinal contours are within normal limits. Aortic  atherosclerosis. Both lungs are clear. The visualized skeletal structures are unremarkable. IMPRESSION: No active cardiopulmonary disease. Electronically Signed   By: Donavan Foil M.D.   On: 03/18/2021 17:07    Procedures Procedures   Medications Ordered in ED Medications  sodium chloride 0.9 % bolus 500 mL (0 mLs Intravenous Stopped 03/18/21 1923)    ED Course  I have reviewed the triage  vital signs and the nursing notes.  Pertinent labs & imaging results that were available during my care of the patient were reviewed by me and considered in my medical decision making (see chart for details).    MDM Rules/Calculators/A&P                          Patient presented with headache chest pain dizziness.  Reviewing records appears he had recent negative heart cath around a year ago.  Has had previous chest pain and headaches with it.  Did feel little dizzy.  Little unsteady.  Mild nystagmus.  Had cerumen impaction on the right side.  It was cleared out and patient felt better.  No more dizziness.  Labs reassuring.  EKG reassuring.  Chest x-ray reassuring.  Doubt cardiac cause of the pain.  Doubt pulmonary embolism or other deadly cause of chest pain.  Will discharge home.  Doubt severe intracranial cause of any dizziness such as stroke. Final Clinical Impression(s) / ED Diagnoses Final diagnoses:  Chest pain, unspecified type  Acute nonintractable headache, unspecified headache type  Impacted cerumen of right ear    Rx / DC Orders ED Discharge Orders    None       Davonna Belling, MD 03/18/21 1947

## 2021-03-18 NOTE — ED Triage Notes (Signed)
Mid chest tightness and headache started today.

## 2021-04-15 DIAGNOSIS — I10 Essential (primary) hypertension: Secondary | ICD-10-CM | POA: Diagnosis not present

## 2021-04-15 DIAGNOSIS — M549 Dorsalgia, unspecified: Secondary | ICD-10-CM | POA: Diagnosis not present

## 2021-04-15 DIAGNOSIS — E871 Hypo-osmolality and hyponatremia: Secondary | ICD-10-CM | POA: Diagnosis not present

## 2021-04-15 DIAGNOSIS — R531 Weakness: Secondary | ICD-10-CM | POA: Diagnosis not present

## 2021-05-29 IMAGING — DX DG CHEST 2V
2 series · 2 of 2 positions shown · non-contrast
Comparison: None.

CLINICAL DATA: Chest pain

EXAM:
CHEST - 2 VIEW

[chest pa]
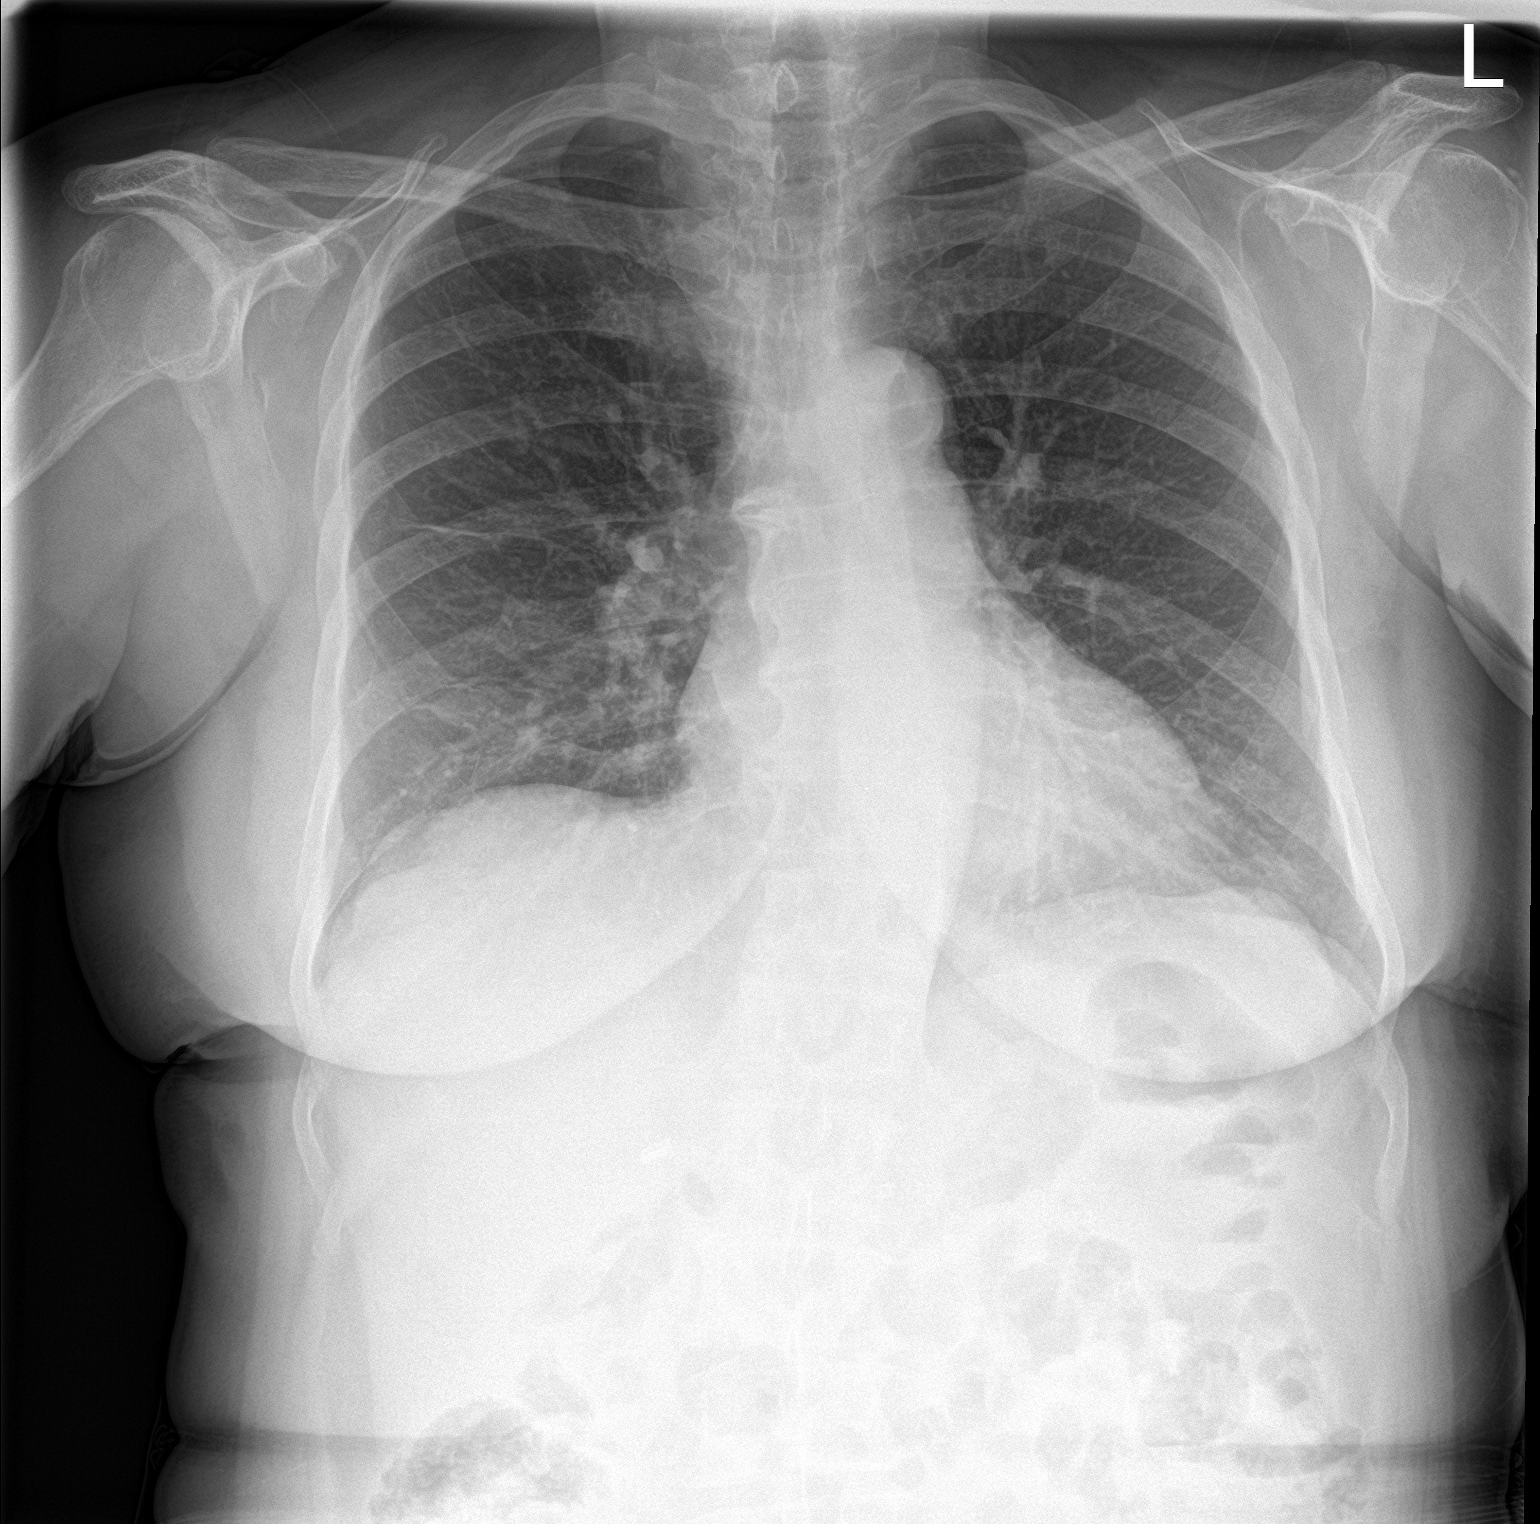

[chest lat]
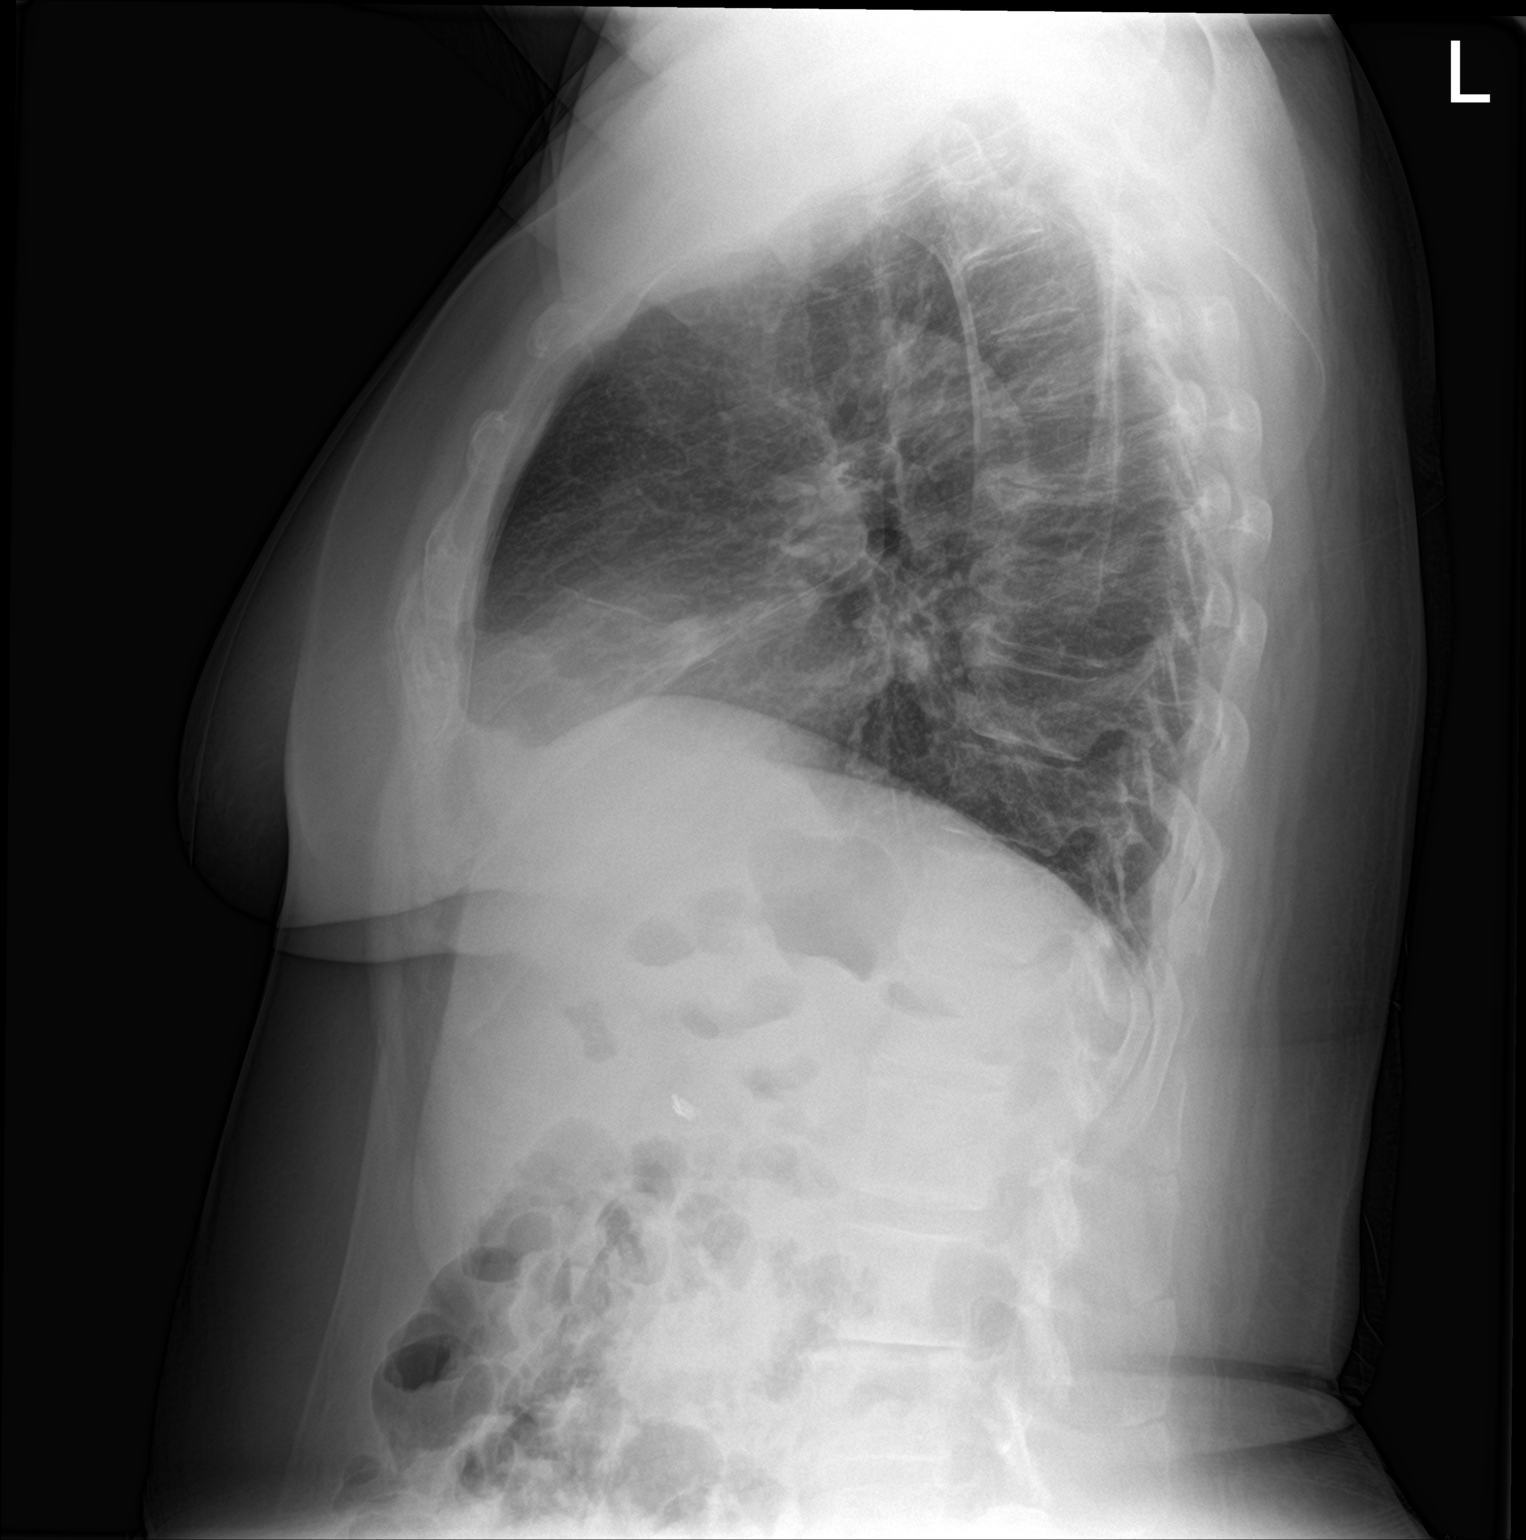

[2 of 2 positions shown; findings below may reference images not displayed]

FINDINGS: The heart size and mediastinal contours are within normal limits.
Aortic knob calcifications are seen. Both lungs are clear. The
visualized skeletal structures are unremarkable.
IMPRESSION: No active cardiopulmonary disease.

## 2021-06-07 DIAGNOSIS — K5900 Constipation, unspecified: Secondary | ICD-10-CM | POA: Diagnosis not present

## 2021-06-07 DIAGNOSIS — Z1389 Encounter for screening for other disorder: Secondary | ICD-10-CM | POA: Diagnosis not present

## 2021-06-07 DIAGNOSIS — Z0000 Encounter for general adult medical examination without abnormal findings: Secondary | ICD-10-CM | POA: Diagnosis not present

## 2021-06-07 DIAGNOSIS — J309 Allergic rhinitis, unspecified: Secondary | ICD-10-CM | POA: Diagnosis not present

## 2021-06-07 DIAGNOSIS — K219 Gastro-esophageal reflux disease without esophagitis: Secondary | ICD-10-CM | POA: Diagnosis not present

## 2021-06-07 DIAGNOSIS — I10 Essential (primary) hypertension: Secondary | ICD-10-CM | POA: Diagnosis not present

## 2021-06-07 DIAGNOSIS — H409 Unspecified glaucoma: Secondary | ICD-10-CM | POA: Diagnosis not present

## 2021-06-07 DIAGNOSIS — E7800 Pure hypercholesterolemia, unspecified: Secondary | ICD-10-CM | POA: Diagnosis not present

## 2021-07-22 DIAGNOSIS — Z23 Encounter for immunization: Secondary | ICD-10-CM | POA: Diagnosis not present

## 2021-08-05 DIAGNOSIS — Z23 Encounter for immunization: Secondary | ICD-10-CM | POA: Diagnosis not present

## 2021-08-12 DIAGNOSIS — R5383 Other fatigue: Secondary | ICD-10-CM | POA: Diagnosis not present

## 2021-08-12 DIAGNOSIS — R079 Chest pain, unspecified: Secondary | ICD-10-CM | POA: Diagnosis not present

## 2021-10-18 DIAGNOSIS — Z961 Presence of intraocular lens: Secondary | ICD-10-CM | POA: Diagnosis not present

## 2021-10-18 DIAGNOSIS — H04123 Dry eye syndrome of bilateral lacrimal glands: Secondary | ICD-10-CM | POA: Diagnosis not present

## 2021-10-18 DIAGNOSIS — H40023 Open angle with borderline findings, high risk, bilateral: Secondary | ICD-10-CM | POA: Diagnosis not present

## 2021-11-30 ENCOUNTER — Other Ambulatory Visit: Payer: Self-pay | Admitting: Family Medicine

## 2021-11-30 DIAGNOSIS — E7800 Pure hypercholesterolemia, unspecified: Secondary | ICD-10-CM | POA: Diagnosis not present

## 2021-11-30 DIAGNOSIS — K219 Gastro-esophageal reflux disease without esophagitis: Secondary | ICD-10-CM | POA: Diagnosis not present

## 2021-11-30 DIAGNOSIS — I10 Essential (primary) hypertension: Secondary | ICD-10-CM | POA: Diagnosis not present

## 2021-11-30 DIAGNOSIS — R2230 Localized swelling, mass and lump, unspecified upper limb: Secondary | ICD-10-CM | POA: Diagnosis not present

## 2021-11-30 DIAGNOSIS — H409 Unspecified glaucoma: Secondary | ICD-10-CM | POA: Diagnosis not present

## 2021-11-30 DIAGNOSIS — K5900 Constipation, unspecified: Secondary | ICD-10-CM | POA: Diagnosis not present

## 2021-11-30 DIAGNOSIS — R2231 Localized swelling, mass and lump, right upper limb: Secondary | ICD-10-CM

## 2021-11-30 DIAGNOSIS — J309 Allergic rhinitis, unspecified: Secondary | ICD-10-CM | POA: Diagnosis not present

## 2021-12-02 DIAGNOSIS — Z79899 Other long term (current) drug therapy: Secondary | ICD-10-CM | POA: Diagnosis not present

## 2021-12-02 DIAGNOSIS — E7800 Pure hypercholesterolemia, unspecified: Secondary | ICD-10-CM | POA: Diagnosis not present

## 2021-12-23 ENCOUNTER — Other Ambulatory Visit: Payer: Self-pay | Admitting: Family Medicine

## 2021-12-23 ENCOUNTER — Ambulatory Visit
Admission: RE | Admit: 2021-12-23 | Discharge: 2021-12-23 | Disposition: A | Discharge status: Home / Self Care | Payer: Medicare Other | Source: Ambulatory Visit | Attending: Family Medicine | Admitting: Family Medicine

## 2021-12-23 DIAGNOSIS — N6320 Unspecified lump in the left breast, unspecified quadrant: Secondary | ICD-10-CM

## 2021-12-23 DIAGNOSIS — R2231 Localized swelling, mass and lump, right upper limb: Secondary | ICD-10-CM

## 2021-12-23 DIAGNOSIS — N6489 Other specified disorders of breast: Secondary | ICD-10-CM | POA: Diagnosis not present

## 2021-12-23 DIAGNOSIS — R599 Enlarged lymph nodes, unspecified: Secondary | ICD-10-CM

## 2021-12-23 DIAGNOSIS — R922 Inconclusive mammogram: Secondary | ICD-10-CM | POA: Diagnosis not present

## 2022-02-18 DIAGNOSIS — M549 Dorsalgia, unspecified: Secondary | ICD-10-CM | POA: Diagnosis not present

## 2022-02-18 DIAGNOSIS — M25511 Pain in right shoulder: Secondary | ICD-10-CM | POA: Diagnosis not present

## 2022-02-18 DIAGNOSIS — R5383 Other fatigue: Secondary | ICD-10-CM | POA: Diagnosis not present

## 2022-02-27 ENCOUNTER — Emergency Department (HOSPITAL_BASED_OUTPATIENT_CLINIC_OR_DEPARTMENT_OTHER): Payer: Medicare Other

## 2022-02-27 ENCOUNTER — Encounter (HOSPITAL_BASED_OUTPATIENT_CLINIC_OR_DEPARTMENT_OTHER): Payer: Self-pay | Admitting: Emergency Medicine

## 2022-02-27 ENCOUNTER — Emergency Department (HOSPITAL_BASED_OUTPATIENT_CLINIC_OR_DEPARTMENT_OTHER)
Admission: EM | Admit: 2022-02-27 | Discharge: 2022-02-27 | Disposition: A | Discharge status: Home / Self Care | Payer: Medicare Other | Attending: Emergency Medicine | Admitting: Emergency Medicine

## 2022-02-27 ENCOUNTER — Other Ambulatory Visit: Payer: Self-pay

## 2022-02-27 DIAGNOSIS — I10 Essential (primary) hypertension: Secondary | ICD-10-CM | POA: Insufficient documentation

## 2022-02-27 DIAGNOSIS — Z79899 Other long term (current) drug therapy: Secondary | ICD-10-CM | POA: Insufficient documentation

## 2022-02-27 DIAGNOSIS — M7918 Myalgia, other site: Secondary | ICD-10-CM | POA: Diagnosis not present

## 2022-02-27 DIAGNOSIS — R079 Chest pain, unspecified: Secondary | ICD-10-CM | POA: Diagnosis not present

## 2022-02-27 DIAGNOSIS — Z7982 Long term (current) use of aspirin: Secondary | ICD-10-CM | POA: Diagnosis not present

## 2022-02-27 DIAGNOSIS — M791 Myalgia, unspecified site: Secondary | ICD-10-CM | POA: Diagnosis not present

## 2022-02-27 DIAGNOSIS — M79661 Pain in right lower leg: Secondary | ICD-10-CM | POA: Diagnosis not present

## 2022-02-27 LAB — CBC WITH DIFFERENTIAL/PLATELET
Abs Immature Granulocytes: 0.01 10*3/uL (ref 0.00–0.07)
Basophils Absolute: 0 10*3/uL (ref 0.0–0.1)
Basophils Relative: 0 %
Eosinophils Absolute: 0.1 10*3/uL (ref 0.0–0.5)
Eosinophils Relative: 1 %
HCT: 36.4 % (ref 36.0–46.0)
Hemoglobin: 11.8 g/dL — ABNORMAL LOW (ref 12.0–15.0)
Immature Granulocytes: 0 %
Lymphocytes Relative: 27 %
Lymphs Abs: 1.3 10*3/uL (ref 0.7–4.0)
MCH: 27.4 pg (ref 26.0–34.0)
MCHC: 32.4 g/dL (ref 30.0–36.0)
MCV: 84.5 fL (ref 80.0–100.0)
Monocytes Absolute: 0.4 10*3/uL (ref 0.1–1.0)
Monocytes Relative: 8 %
Neutro Abs: 3.1 10*3/uL (ref 1.7–7.7)
Neutrophils Relative %: 64 %
Platelets: 231 10*3/uL (ref 150–400)
RBC: 4.31 MIL/uL (ref 3.87–5.11)
RDW: 14.8 % (ref 11.5–15.5)
WBC: 4.9 10*3/uL (ref 4.0–10.5)
nRBC: 0 % (ref 0.0–0.2)

## 2022-02-27 LAB — COMPREHENSIVE METABOLIC PANEL
ALT: 24 U/L (ref 0–44)
AST: 41 U/L (ref 15–41)
Albumin: 4.6 g/dL (ref 3.5–5.0)
Alkaline Phosphatase: 70 U/L (ref 38–126)
Anion gap: 12 (ref 5–15)
BUN: 11 mg/dL (ref 8–23)
CO2: 24 mmol/L (ref 22–32)
Calcium: 10.1 mg/dL (ref 8.9–10.3)
Chloride: 97 mmol/L — ABNORMAL LOW (ref 98–111)
Creatinine, Ser: 0.78 mg/dL (ref 0.44–1.00)
GFR, Estimated: >60 mL/min (ref >60)
Glucose, Bld: 86 mg/dL (ref 70–99)
Potassium: 5.2 mmol/L — ABNORMAL HIGH (ref 3.5–5.1)
Sodium: 133 mmol/L — ABNORMAL LOW (ref 135–145)
Total Bilirubin: 0.5 mg/dL (ref 0.3–1.2)
Total Protein: 8.2 g/dL — ABNORMAL HIGH (ref 6.5–8.1)

## 2022-02-27 LAB — MAGNESIUM: Magnesium: 2.4 mg/dL (ref 1.7–2.4)

## 2022-02-27 LAB — TROPONIN I (HIGH SENSITIVITY): Troponin I (High Sensitivity): 2 ng/L (ref <18)

## 2022-02-27 MED ORDER — LACTATED RINGERS IV BOLUS
1000.0000 mL | Freq: Once | INTRAVENOUS | Status: AC
  Administered 2022-02-27: 1000 mL via INTRAVENOUS

## 2022-02-27 MED ORDER — KETOROLAC TROMETHAMINE 15 MG/ML IJ SOLN
15.0000 mg | Freq: Once | INTRAMUSCULAR | Status: AC
  Administered 2022-02-27: 15 mg via INTRAVENOUS
  Filled 2022-02-27: qty 1

## 2022-02-27 MED ORDER — SODIUM ZIRCONIUM CYCLOSILICATE 10 G PO PACK
10.0000 g | PACK | Freq: Once | ORAL | Status: AC
  Administered 2022-02-27: 10 g via ORAL
  Filled 2022-02-27: qty 1

## 2022-02-27 NOTE — ED Notes (Signed)
Discharge instructions reviewed and explained, pt verbalized understanding and had no further questions on d/c. ?

## 2022-02-27 NOTE — Discharge Instructions (Signed)
Your potassium was slightly high today.  You were given a medication to help lower it.  You will need to get repeat lab work to ensure that potassium remains in a normal level.  Schedule a follow-up appointment with your doctor to arrange for this.  Hold off on home potassium supplements until you are able to arrange this follow-up and repeat lab work.  Talk to your doctor about your daily medications.  Return to the emergency department for any new or worsening symptoms. ?

## 2022-02-27 NOTE — ED Triage Notes (Signed)
Woke up to shooting pain to mid  right leg going to hip . Hx arthritis . No fall or injury  ?

## 2022-02-27 NOTE — ED Provider Notes (Signed)
?[facility] ?Provider Note ? ? ?CSN: [id number] ?Arrival date & time: 02/27/22  1511 ? ?  ? ?History ? ?Chief Complaint  ?Patient presents with  ? Leg Pain  ? ? ?Carmen Fischer is a 81 y.o. female. ? ? ?Leg Pain ?Associated symptoms: back pain   ?Patient presents for multiple complaints and concerns.  Medical history includes arthritis, HLD, HTN, GERD.  This morning, she got out of bed.  She was walking to the house, she noticed a tightness throughout her right lower extremity.  Pain and tightness was located throughout the calf as well as the lateral aspect of her upper leg.  She took some Tylenol and had some improvement.  Later in the day, she experienced pain and tightness in her right subscapular area.  She also noticed that her blood pressure was elevated beyond normal.  He was in the range of 150s over 80s.  Is typically in the range of 120s over 80s.  She denies any shortness of breath or chest pain.  She has had normal p.o. intake.  She has not had vomiting or diarrhea.  She has not had any recent increased exertions.  Currently, she walks less than she used to due to fatigue over the past several months. ?  ? ?Home Medications ?Prior to Admission medications   ?Medication Sig Start Date End Date Taking? Authorizing Provider  ?aspirin EC 81 MG tablet Take 81 mg by mouth daily.    [provider]  ?COVID-19 mRNA vaccine, Moderna, 100 MCG/0.5ML injection Inject into the muscle. 02/11/21   Carlyle Basques, MD  ?cycloSPORINE (RESTASIS) 0.05 % ophthalmic emulsion Place 1 drop into both eyes 2 (two) times daily.    [provider]  ?desloratadine (CLARINEX) 5 MG tablet Take 5 mg by mouth daily. 02/13/20   [provider]  ?hydrochlorothiazide (HYDRODIURIL) 25 MG tablet Take 25 mg by mouth daily.    [provider]  ?Multiple Vitamin (MULTIVITAMIN WITH MINERALS) TABS tablet Take 1 tablet by mouth daily.    [provider]  ?omeprazole (PRILOSEC)  20 MG capsule Take 20 mg by mouth daily.    [provider]  ?OVER THE COUNTER MEDICATION Take 1 tablet by mouth daily. Hydroeye Vitamin    [provider]  ?polyethylene glycol (MIRALAX / GLYCOLAX) 17 g packet Take 17 g by mouth daily as needed for moderate constipation.     [provider]  ?simvastatin (ZOCOR) 20 MG tablet Take 20 mg by mouth every evening.     [provider]  ?spironolactone (ALDACTONE) 25 MG tablet Take 0.5 tablets (12.5 mg total) by mouth daily. 03/04/20   Dorothy Spark, MD  ?Travoprost, BAK Free, (TRAVATAN) 0.004 % SOLN ophthalmic solution Place 1 drop into both eyes at bedtime.    [provider]  ?   ? ?Allergies    ?Shellfish allergy and Prednisone   ? ?Review of Systems   ?Review of Systems  ?Musculoskeletal:  Positive for back pain and myalgias.  ?All other systems reviewed and are negative. ? ?Physical Exam ?Updated Vital Signs ?BP 126/73 (BP Location: Right Arm)   Pulse 75   Temp 97.9 ?F (36.6 ?C) (Oral)   Resp 16   Wt 82.6 kg   SpO2 97%   BMI 32.24 kg/m?  ?Physical Exam ?Vitals and nursing note reviewed.  ?Constitutional:   ?   General: She is not in acute distress. ?   Appearance: Normal appearance. She is well-developed and normal  weight. She is not ill-appearing, toxic-appearing or diaphoretic.  ?HENT:  ?   Head: Normocephalic and atraumatic.  ?   Right Ear: External ear normal.  ?   Left Ear: External ear normal.  ?   Nose: Nose normal.  ?   Mouth/Throat:  ?   Mouth: Mucous membranes are moist.  ?   Pharynx: Oropharynx is clear.  ?Eyes:  ?   General: No scleral icterus. ?   Extraocular Movements: Extraocular movements intact.  ?   Conjunctiva/sclera: Conjunctivae normal.  ?Cardiovascular:  ?   Rate and Rhythm: Normal rate and regular rhythm.  ?   Heart sounds: No murmur heard. ?Pulmonary:  ?   Effort: Pulmonary effort is normal. No respiratory distress.  ?   Breath sounds: Normal breath sounds. No wheezing or rales.  ?Chest:   ?   Chest wall: No tenderness.  ?Abdominal:  ?   General: There is no distension.  ?   Palpations: Abdomen is soft.  ?   Tenderness: There is no abdominal tenderness.  ?Musculoskeletal:     ?   General: Tenderness (Muscle tenderness in right scapular area, right calf and right ITB area) present. No swelling.  ?   Cervical back: Normal range of motion and neck supple. No rigidity.  ?Skin: ?   General: Skin is warm and dry.  ?   Capillary Refill: Capillary refill takes less than 2 seconds.  ?   Coloration: Skin is not jaundiced or pale.  ?Neurological:  ?   General: No focal deficit present.  ?   Mental Status: She is alert and oriented to person, place, and time.  ?   Cranial Nerves: No cranial nerve deficit.  ?   Sensory: No sensory deficit.  ?   Motor: No weakness.  ?   Coordination: Coordination normal.  ?Psychiatric:     ?   Mood and Affect: Mood normal.     ?   Behavior: Behavior normal.     ?   Thought Content: Thought content normal.     ?   Judgment: Judgment normal.  ? ? ?ED Results / Procedures / Treatments   ?Labs ?(all labs ordered are listed, but only abnormal results are displayed) ?Labs Reviewed  ?COMPREHENSIVE METABOLIC PANEL - Abnormal; Notable for the following components:  ?    Result Value  ? Sodium 133 (*)   ? Potassium 5.2 (*)   ? Chloride 97 (*)   ? Total Protein 8.2 (*)   ? All other components within normal limits  ?CBC WITH DIFFERENTIAL/PLATELET - Abnormal; Notable for the following components:  ? Hemoglobin 11.8 (*)   ? All other components within normal limits  ?MAGNESIUM  ?CBC WITH DIFFERENTIAL/PLATELET  ?TROPONIN I (HIGH SENSITIVITY)  ? ? ?EKG ?EKG Interpretation ? ?Date/Time:  Sunday February 27 2022 17:48:15 EDT ?Ventricular Rate:  63 ?PR Interval:  137 ?QRS Duration: 110 ?QT Interval:  423 ?QTC Calculation: 433 ?R Axis:   -36 ?Text Interpretation: Sinus rhythm Left axis deviation Abnormal R-wave progression, early transition Confirmed by Godfrey Pick [phone]) on 02/27/2022 8:08:00  PM ? ?Radiology ?US Venous Img Lower Right (DVT Study) ? ?Result Date: 02/27/2022 ?CLINICAL DATA:  RIGHT calf and shin pain today, burning pain radiating to hip EXAM: RIGHT LOWER EXTREMITY VENOUS DOPPLER ULTRASOUND TECHNIQUE: Gray-scale sonography with compression, as well as color and duplex ultrasound, were performed to evaluate the deep venous system(s) from the level of the common femoral vein through the popliteal and proximal calf  veins. COMPARISON:  12/23/2021 FINDINGS: VENOUS Normal compressibility of the common femoral, superficial femoral, and popliteal veins, as well as the visualized calf veins. Visualized portions of profunda femoral vein and great saphenous vein unremarkable. No filling defects to suggest DVT on grayscale or color Doppler imaging. Doppler waveforms show normal direction of venous flow, normal respiratory plasticity and response to augmentation. Limited views of the contralateral common femoral vein are unremarkable. OTHER None. Limitations: none IMPRESSION: No evidence of deep venous thrombosis in the RIGHT lower extremity. Electronically Signed   By: Lavonia Dana M.D.   On: 02/27/2022 18:57  ? ?DG Chest Portable 1 View ? ?Result Date: 02/27/2022 ?CLINICAL DATA:  Calf pain and tenderness. EXAM: PORTABLE CHEST 1 VIEW COMPARISON:  03/18/2021 FINDINGS: Heart size mediastinal contours are unremarkable. No pleural effusion or edema identified. No airspace opacities identified. The visualized osseous structures appear intact. IMPRESSION: No acute cardiopulmonary abnormalities. Electronically Signed   By: Kerby Moors M.D.   On: 02/27/2022 18:27   ? ?Procedures ?Procedures  ? ? ?Medications Ordered in ED ?Medications  ?ketorolac (TORADOL) 15 MG/ML injection 15 mg (15 mg Intravenous Given 02/27/22 1756)  ?lactated ringers bolus 1,000 mL (0 mLs Intravenous Stopped 02/27/22 1900)  ?sodium zirconium cyclosilicate (LOKELMA) packet 10 g (10 g Oral Given 02/27/22 2130)  ? ? ?ED Course/ Medical Decision  Making/ A&P ?  ?                        ?Medical Decision Making ?Amount and/or Complexity of Data Reviewed ?Labs: ordered. ?Radiology: ordered. ? ?Risk ?Prescription drug management. ? ? ?Patient is a highly functiona

## 2022-02-28 DIAGNOSIS — M79604 Pain in right leg: Secondary | ICD-10-CM | POA: Diagnosis not present

## 2022-02-28 DIAGNOSIS — E875 Hyperkalemia: Secondary | ICD-10-CM | POA: Diagnosis not present

## 2022-03-01 DIAGNOSIS — M7061 Trochanteric bursitis, right hip: Secondary | ICD-10-CM | POA: Diagnosis not present

## 2022-03-01 DIAGNOSIS — M25551 Pain in right hip: Secondary | ICD-10-CM | POA: Diagnosis not present

## 2022-03-10 DIAGNOSIS — M5431 Sciatica, right side: Secondary | ICD-10-CM | POA: Diagnosis not present

## 2022-03-10 DIAGNOSIS — M79604 Pain in right leg: Secondary | ICD-10-CM | POA: Diagnosis not present

## 2022-03-10 DIAGNOSIS — M6281 Muscle weakness (generalized): Secondary | ICD-10-CM | POA: Diagnosis not present

## 2022-03-14 DIAGNOSIS — M5431 Sciatica, right side: Secondary | ICD-10-CM | POA: Diagnosis not present

## 2022-03-14 DIAGNOSIS — M6281 Muscle weakness (generalized): Secondary | ICD-10-CM | POA: Diagnosis not present

## 2022-03-14 DIAGNOSIS — M79604 Pain in right leg: Secondary | ICD-10-CM | POA: Diagnosis not present

## 2022-03-17 DIAGNOSIS — M79604 Pain in right leg: Secondary | ICD-10-CM | POA: Diagnosis not present

## 2022-03-17 DIAGNOSIS — M5431 Sciatica, right side: Secondary | ICD-10-CM | POA: Diagnosis not present

## 2022-03-17 DIAGNOSIS — M6281 Muscle weakness (generalized): Secondary | ICD-10-CM | POA: Diagnosis not present

## 2022-03-21 DIAGNOSIS — M5431 Sciatica, right side: Secondary | ICD-10-CM | POA: Diagnosis not present

## 2022-03-21 DIAGNOSIS — M6281 Muscle weakness (generalized): Secondary | ICD-10-CM | POA: Diagnosis not present

## 2022-03-21 DIAGNOSIS — M79604 Pain in right leg: Secondary | ICD-10-CM | POA: Diagnosis not present

## 2022-03-24 ENCOUNTER — Ambulatory Visit
Admission: RE | Admit: 2022-03-24 | Discharge: 2022-03-24 | Disposition: A | Discharge status: Home / Self Care | Payer: Medicare Other | Source: Ambulatory Visit | Attending: Family Medicine | Admitting: Family Medicine

## 2022-03-24 ENCOUNTER — Other Ambulatory Visit: Payer: Self-pay | Admitting: Family Medicine

## 2022-03-24 DIAGNOSIS — R599 Enlarged lymph nodes, unspecified: Secondary | ICD-10-CM

## 2022-03-24 DIAGNOSIS — R2231 Localized swelling, mass and lump, right upper limb: Secondary | ICD-10-CM

## 2022-03-24 DIAGNOSIS — R590 Localized enlarged lymph nodes: Secondary | ICD-10-CM | POA: Diagnosis not present

## 2022-03-25 DIAGNOSIS — M79604 Pain in right leg: Secondary | ICD-10-CM | POA: Diagnosis not present

## 2022-03-25 DIAGNOSIS — M5431 Sciatica, right side: Secondary | ICD-10-CM | POA: Diagnosis not present

## 2022-03-25 DIAGNOSIS — M6281 Muscle weakness (generalized): Secondary | ICD-10-CM | POA: Diagnosis not present

## 2022-03-29 DIAGNOSIS — M6281 Muscle weakness (generalized): Secondary | ICD-10-CM | POA: Diagnosis not present

## 2022-03-29 DIAGNOSIS — M5431 Sciatica, right side: Secondary | ICD-10-CM | POA: Diagnosis not present

## 2022-03-29 DIAGNOSIS — M79604 Pain in right leg: Secondary | ICD-10-CM | POA: Diagnosis not present

## 2022-03-30 ENCOUNTER — Other Ambulatory Visit: Payer: Self-pay | Admitting: Family Medicine

## 2022-03-30 ENCOUNTER — Ambulatory Visit
Admission: RE | Admit: 2022-03-30 | Discharge: 2022-03-30 | Disposition: A | Discharge status: Home / Self Care | Payer: Medicare Other | Source: Ambulatory Visit | Attending: Family Medicine | Admitting: Family Medicine

## 2022-03-30 DIAGNOSIS — R599 Enlarged lymph nodes, unspecified: Secondary | ICD-10-CM

## 2022-03-30 DIAGNOSIS — R2231 Localized swelling, mass and lump, right upper limb: Secondary | ICD-10-CM

## 2022-03-30 DIAGNOSIS — R590 Localized enlarged lymph nodes: Secondary | ICD-10-CM | POA: Diagnosis not present

## 2022-03-31 DIAGNOSIS — M79604 Pain in right leg: Secondary | ICD-10-CM | POA: Diagnosis not present

## 2022-03-31 DIAGNOSIS — M5431 Sciatica, right side: Secondary | ICD-10-CM | POA: Diagnosis not present

## 2022-03-31 DIAGNOSIS — M6281 Muscle weakness (generalized): Secondary | ICD-10-CM | POA: Diagnosis not present

## 2022-04-04 DIAGNOSIS — M79604 Pain in right leg: Secondary | ICD-10-CM | POA: Diagnosis not present

## 2022-04-04 DIAGNOSIS — M5431 Sciatica, right side: Secondary | ICD-10-CM | POA: Diagnosis not present

## 2022-04-04 DIAGNOSIS — M6281 Muscle weakness (generalized): Secondary | ICD-10-CM | POA: Diagnosis not present

## 2022-04-07 DIAGNOSIS — M5431 Sciatica, right side: Secondary | ICD-10-CM | POA: Diagnosis not present

## 2022-04-07 DIAGNOSIS — M79604 Pain in right leg: Secondary | ICD-10-CM | POA: Diagnosis not present

## 2022-04-07 DIAGNOSIS — M6281 Muscle weakness (generalized): Secondary | ICD-10-CM | POA: Diagnosis not present

## 2022-04-11 DIAGNOSIS — M6281 Muscle weakness (generalized): Secondary | ICD-10-CM | POA: Diagnosis not present

## 2022-04-11 DIAGNOSIS — M79604 Pain in right leg: Secondary | ICD-10-CM | POA: Diagnosis not present

## 2022-04-11 DIAGNOSIS — M5431 Sciatica, right side: Secondary | ICD-10-CM | POA: Diagnosis not present

## 2022-04-19 DIAGNOSIS — M79604 Pain in right leg: Secondary | ICD-10-CM | POA: Diagnosis not present

## 2022-04-19 DIAGNOSIS — M5431 Sciatica, right side: Secondary | ICD-10-CM | POA: Diagnosis not present

## 2022-04-19 DIAGNOSIS — M6281 Muscle weakness (generalized): Secondary | ICD-10-CM | POA: Diagnosis not present

## 2022-04-27 DIAGNOSIS — H02821 Cysts of right upper eyelid: Secondary | ICD-10-CM | POA: Diagnosis not present

## 2022-04-27 DIAGNOSIS — H40023 Open angle with borderline findings, high risk, bilateral: Secondary | ICD-10-CM | POA: Diagnosis not present

## 2022-04-27 DIAGNOSIS — H04123 Dry eye syndrome of bilateral lacrimal glands: Secondary | ICD-10-CM | POA: Diagnosis not present

## 2022-06-01 ENCOUNTER — Other Ambulatory Visit: Payer: Self-pay | Admitting: Family Medicine

## 2022-06-01 DIAGNOSIS — R5383 Other fatigue: Secondary | ICD-10-CM | POA: Diagnosis not present

## 2022-06-01 DIAGNOSIS — R519 Headache, unspecified: Secondary | ICD-10-CM | POA: Diagnosis not present

## 2022-06-01 DIAGNOSIS — H9313 Tinnitus, bilateral: Secondary | ICD-10-CM

## 2022-06-07 ENCOUNTER — Ambulatory Visit
Admission: RE | Admit: 2022-06-07 | Discharge: 2022-06-07 | Disposition: A | Discharge status: Home / Self Care | Payer: Medicare Other | Source: Ambulatory Visit | Attending: Family Medicine | Admitting: Family Medicine

## 2022-06-07 DIAGNOSIS — R519 Headache, unspecified: Secondary | ICD-10-CM

## 2022-06-07 DIAGNOSIS — G319 Degenerative disease of nervous system, unspecified: Secondary | ICD-10-CM | POA: Diagnosis not present

## 2022-06-07 DIAGNOSIS — H9313 Tinnitus, bilateral: Secondary | ICD-10-CM

## 2022-06-07 DIAGNOSIS — H9319 Tinnitus, unspecified ear: Secondary | ICD-10-CM | POA: Diagnosis not present

## 2022-06-07 MED ORDER — IOPAMIDOL (ISOVUE-300) INJECTION 61%
75.0000 mL | Freq: Once | INTRAVENOUS | Status: AC | PRN
  Administered 2022-06-07: 75 mL via INTRAVENOUS

## 2022-06-09 DIAGNOSIS — Z0000 Encounter for general adult medical examination without abnormal findings: Secondary | ICD-10-CM | POA: Diagnosis not present

## 2022-06-09 DIAGNOSIS — H409 Unspecified glaucoma: Secondary | ICD-10-CM | POA: Diagnosis not present

## 2022-06-09 DIAGNOSIS — Z1331 Encounter for screening for depression: Secondary | ICD-10-CM | POA: Diagnosis not present

## 2022-06-09 DIAGNOSIS — I10 Essential (primary) hypertension: Secondary | ICD-10-CM | POA: Diagnosis not present

## 2022-06-09 DIAGNOSIS — K219 Gastro-esophageal reflux disease without esophagitis: Secondary | ICD-10-CM | POA: Diagnosis not present

## 2022-06-09 DIAGNOSIS — K5900 Constipation, unspecified: Secondary | ICD-10-CM | POA: Diagnosis not present

## 2022-06-09 DIAGNOSIS — J309 Allergic rhinitis, unspecified: Secondary | ICD-10-CM | POA: Diagnosis not present

## 2022-06-09 DIAGNOSIS — E7800 Pure hypercholesterolemia, unspecified: Secondary | ICD-10-CM | POA: Diagnosis not present

## 2022-07-07 DIAGNOSIS — Z23 Encounter for immunization: Secondary | ICD-10-CM | POA: Diagnosis not present

## 2022-07-31 ENCOUNTER — Encounter (HOSPITAL_BASED_OUTPATIENT_CLINIC_OR_DEPARTMENT_OTHER): Payer: Self-pay

## 2022-07-31 ENCOUNTER — Emergency Department (HOSPITAL_BASED_OUTPATIENT_CLINIC_OR_DEPARTMENT_OTHER)
Admission: EM | Admit: 2022-07-31 | Discharge: 2022-07-31 | Disposition: A | Discharge status: Home / Self Care | Payer: Medicare Other | Attending: Emergency Medicine | Admitting: Emergency Medicine

## 2022-07-31 ENCOUNTER — Other Ambulatory Visit: Payer: Self-pay

## 2022-07-31 ENCOUNTER — Emergency Department (HOSPITAL_BASED_OUTPATIENT_CLINIC_OR_DEPARTMENT_OTHER): Payer: Medicare Other | Admitting: Radiology

## 2022-07-31 ENCOUNTER — Emergency Department (HOSPITAL_BASED_OUTPATIENT_CLINIC_OR_DEPARTMENT_OTHER): Payer: Medicare Other

## 2022-07-31 DIAGNOSIS — R519 Headache, unspecified: Secondary | ICD-10-CM | POA: Diagnosis not present

## 2022-07-31 DIAGNOSIS — I10 Essential (primary) hypertension: Secondary | ICD-10-CM | POA: Insufficient documentation

## 2022-07-31 DIAGNOSIS — I160 Hypertensive urgency: Secondary | ICD-10-CM | POA: Diagnosis not present

## 2022-07-31 DIAGNOSIS — Z79899 Other long term (current) drug therapy: Secondary | ICD-10-CM | POA: Insufficient documentation

## 2022-07-31 DIAGNOSIS — Z7982 Long term (current) use of aspirin: Secondary | ICD-10-CM | POA: Diagnosis not present

## 2022-07-31 DIAGNOSIS — R079 Chest pain, unspecified: Secondary | ICD-10-CM | POA: Diagnosis not present

## 2022-07-31 LAB — CBC
HCT: 39 % (ref 36.0–46.0)
Hemoglobin: 12.8 g/dL (ref 12.0–15.0)
MCH: 27.4 pg (ref 26.0–34.0)
MCHC: 32.8 g/dL (ref 30.0–36.0)
MCV: 83.3 fL (ref 80.0–100.0)
Platelets: 249 10*3/uL (ref 150–400)
RBC: 4.68 MIL/uL (ref 3.87–5.11)
RDW: 14.6 % (ref 11.5–15.5)
WBC: 4.7 10*3/uL (ref 4.0–10.5)
nRBC: 0 % (ref 0.0–0.2)

## 2022-07-31 LAB — BASIC METABOLIC PANEL
Anion gap: 11 (ref 5–15)
BUN: 12 mg/dL (ref 8–23)
CO2: 28 mmol/L (ref 22–32)
Calcium: 10 mg/dL (ref 8.9–10.3)
Chloride: 96 mmol/L — ABNORMAL LOW (ref 98–111)
Creatinine, Ser: 1.01 mg/dL — ABNORMAL HIGH (ref 0.44–1.00)
GFR, Estimated: 55 mL/min — ABNORMAL LOW (ref >60)
Glucose, Bld: 85 mg/dL (ref 70–99)
Potassium: 3.4 mmol/L — ABNORMAL LOW (ref 3.5–5.1)
Sodium: 135 mmol/L (ref 135–145)

## 2022-07-31 LAB — TROPONIN I (HIGH SENSITIVITY)
Troponin I (High Sensitivity): 2 ng/L (ref <18)
Troponin I (High Sensitivity): 3 ng/L (ref <18)

## 2022-07-31 MED ORDER — POTASSIUM CHLORIDE CRYS ER 20 MEQ PO TBCR
20.0000 meq | EXTENDED_RELEASE_TABLET | Freq: Once | ORAL | Status: AC
  Administered 2022-07-31: 20 meq via ORAL
  Filled 2022-07-31: qty 1

## 2022-07-31 NOTE — ED Triage Notes (Signed)
Patient here POV from Home.  Endorse Feeling Off-Balance and having Pain in Jaw at approximately 1530. Decided to assess BP and it was high and kept becoming higher with most recent BP being 179/95.  History of Treated HTN. No Pain Currently.   NAD noted During Triage. A&Ox4. GCS 15. Ambulatory.

## 2022-07-31 NOTE — Discharge Instructions (Addendum)
It is unclear why your blood pressure went so high today.  Fortunately is back down to normal.  The work-up today does not show any emergent findings.  You should call your doctor to set up further outpatient work-up and follow-up.  If you develop severe headache, vision changes, weakness or numbness (especially one-sided), speech difficulty, chest pain, or any other new/concerning symptoms then return to the ER for evaluation.

## 2022-07-31 NOTE — ED Provider Notes (Signed)
[facility] EMERGENCY DEPT Provider Note   CSN: [id number] Arrival date & time: 07/31/22  1727     History  Chief Complaint  Patient presents with   Hypertension    Carmen Fischer is a 81 y.o. female.  HPI 81 year old female presents with hypertension.  This afternoon she started feeling multiple symptoms which included a slight/ongoing headache, felt a little off balance, had bilateral leg weakness and general fatigue as well as a little bit of pain in her jaw.  She transiently for a few minutes had some chest pressure.  All of the symptoms have resolved.  When this first occurred she checked her blood pressure and it was 179/95.  She has been taking her medicines appropriately.  She took 2 regular aspirin when this occurred.  Currently she feels asymptomatic.  Her blood pressure seems to be back to normal on my evaluation, 120/70.  Home Medications Prior to Admission medications   Medication Sig Start Date End Date Taking? Authorizing Provider  aspirin EC 81 MG tablet Take 81 mg by mouth daily.    [provider]  COVID-19 mRNA vaccine, Moderna, 100 MCG/0.5ML injection Inject into the muscle. 02/11/21   Carlyle Basques, MD  cycloSPORINE (RESTASIS) 0.05 % ophthalmic emulsion Place 1 drop into both eyes 2 (two) times daily.    [provider]  desloratadine (CLARINEX) 5 MG tablet Take 5 mg by mouth daily. 02/13/20   [provider]  hydrochlorothiazide (HYDRODIURIL) 25 MG tablet Take 25 mg by mouth daily.    [provider]  Multiple Vitamin (MULTIVITAMIN WITH MINERALS) TABS tablet Take 1 tablet by mouth daily.    [provider]  omeprazole (PRILOSEC) 20 MG capsule Take 20 mg by mouth daily.    [provider]  OVER THE COUNTER MEDICATION Take 1 tablet by mouth daily. Hydroeye Vitamin    [provider]  polyethylene glycol (MIRALAX / GLYCOLAX) 17 g packet Take 17 g by mouth daily as needed for moderate  constipation.     [provider]  simvastatin (ZOCOR) 20 MG tablet Take 20 mg by mouth every evening.     [provider]  spironolactone (ALDACTONE) 25 MG tablet Take 0.5 tablets (12.5 mg total) by mouth daily. 03/04/20   Dorothy Spark, MD  Travoprost, BAK Free, (TRAVATAN) 0.004 % SOLN ophthalmic solution Place 1 drop into both eyes at bedtime.    [provider]      Allergies    Shellfish allergy and Prednisone    Review of Systems   Review of Systems  Constitutional:  Positive for fatigue.  Respiratory:  Negative for shortness of breath.   Cardiovascular:  Positive for chest pain.  Gastrointestinal:  Negative for abdominal pain.  Neurological:  Positive for dizziness and headaches. Negative for weakness and numbness.    Physical Exam Updated Vital Signs BP 124/72   Pulse (!) 58   Temp 97.7 F (36.5 C) (Oral)   Resp 11   Ht '5\' 3"'$  (1.6 m)   Wt 82.6 kg   SpO2 98%   BMI 32.26 kg/m  Physical Exam Vitals and nursing note reviewed.  Constitutional:      Appearance: She is well-developed.  HENT:     Head: Normocephalic and atraumatic.  Eyes:     Extraocular Movements: Extraocular movements intact.     Pupils: Pupils are equal, round, and reactive to light.  Cardiovascular:     Rate and Rhythm: Normal rate and regular rhythm.  Heart sounds: Normal heart sounds.  Pulmonary:     Effort: Pulmonary effort is normal.     Breath sounds: Normal breath sounds.  Abdominal:     Palpations: Abdomen is soft.     Tenderness: There is no abdominal tenderness.  Musculoskeletal:     Cervical back: No rigidity.  Skin:    General: Skin is warm and dry.  Neurological:     Mental Status: She is alert.     Comments: CN 3-12 grossly intact. 5/5 strength in all 4 extremities. Grossly normal sensation. Normal finger to nose. Normal gait     ED Results / Procedures / Treatments   Labs (all labs ordered are listed, but only abnormal results are  displayed) Labs Reviewed  BASIC METABOLIC PANEL - Abnormal; Notable for the following components:      Result Value   Potassium 3.4 (*)    Chloride 96 (*)    Creatinine, Ser 1.01 (*)    GFR, Estimated 55 (*)    All other components within normal limits  CBC  TROPONIN I (HIGH SENSITIVITY)  TROPONIN I (HIGH SENSITIVITY)    EKG EKG Interpretation  Date/Time:  Sunday July 31 2022 18:10:15 EDT Ventricular Rate:  61 PR Interval:  168 QRS Duration: 82 QT Interval:  434 QTC Calculation: 436 R Axis:   -64 Text Interpretation: Normal sinus rhythm 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 Jaw pain and hypertension. EXAM: CT HEAD WITHOUT CONTRAST TECHNIQUE: Contiguous axial images were obtained from the base of the skull through the vertex without intravenous contrast. RADIATION DOSE REDUCTION: This exam was performed according to the departmental dose-optimization program which includes automated exposure control, adjustment of the mA and/or kV according to patient size and/or use of iterative reconstruction technique. COMPARISON:  06/07/2022. FINDINGS: Brain: No acute intracranial hemorrhage, midline shift or mass effect. No extra-axial fluid collection. Diffuse atrophy is noted. Periventricular white matter hypodensities are present bilaterally. No hydrocephalus. Vascular: No hyperdense vessel or unexpected calcification. Skull: Normal. Negative for fracture or focal lesion. Sinuses/Orbits: No acute finding. Other: Partial opacification of the mastoid air cells on the left, unchanged from the previous exam. IMPRESSION: 1. No acute intracranial process. 2. Atrophy with chronic microvascular ischemic changes. Electronically Signed   By: Brett Fairy M.D.   On: 07/31/2022 22:31   DG Chest 2 View  Result  Date: 07/31/2022 CLINICAL DATA:  Chest pain. EXAM: CHEST - 2 VIEW COMPARISON:  AP chest 02/27/2022, chest two views 03/18/2021 FINDINGS: Cardiac silhouette and mediastinal contours within normal limits. Mild calcification within the aortic arch. Mild linear right lower lung scarring is unchanged from multiple prior radiographs. No acute airspace opacity. No pleural effusion or pneumothorax. Moderate multilevel degenerative disc changes of the thoracic spine. Minimal levocurvature of the mid to lower thoracic spine. Cholecystectomy clips. IMPRESSION: 1. No active cardiopulmonary disease. 2. Mild linear right lower lung scarring. Electronically Signed   By: Yvonne Kendall M.D.   On: 07/31/2022 18:55    Procedures Procedures    Medications Ordered in ED Medications  potassium chloride SA (KLOR-CON M) CR tablet 20 mEq (20 mEq Oral Given 07/31/22 2321)    ED Course/ Medical Decision Making/ A&P  Medical Decision Making Amount and/or Complexity of Data Reviewed Labs: ordered.    Details: Mild hypokalemia.  Troponin is normal x2.  Normal WBC/hemoglobin. Radiology: ordered and independent interpretation performed.    Details: CT head without head bleed.  Chest x-ray without CHF. ECG/medicine tests: independent interpretation performed.    Details: No acute ischemia.  Risk Prescription drug management.   Patient presents with multiple nonspecific symptoms this afternoon.  It was associated with high blood pressure at home.  Unclear if this was the cause or result of.  However there were no specific symptoms such as unilateral weakness that would make me think this was a TIA.  Here she is essentially asymptomatic when I am seeing her.  Given her age and nonspecific complaints including a headache a CT head was obtained which is unremarkable.  My suspicion this was TIA is pretty low.  Troponins were sent given the very brief episode of chest pressure but this was also negative.   I doubt ACS.  Overall she is feeling well now and her blood pressure is back down to normal.  Potassium was repleted but at this point there is no clear cause and I think she should follow-up with her PCP.  She was given return precautions.        Final Clinical Impression(s) / ED Diagnoses Final diagnoses:  Hypertensive urgency    Rx / DC Orders ED Discharge Orders     None         Sherwood Gambler, MD 07/31/22 2325

## 2022-08-11 DIAGNOSIS — Z23 Encounter for immunization: Secondary | ICD-10-CM | POA: Diagnosis not present

## 2022-08-22 DIAGNOSIS — I10 Essential (primary) hypertension: Secondary | ICD-10-CM | POA: Diagnosis not present

## 2022-08-22 DIAGNOSIS — R531 Weakness: Secondary | ICD-10-CM | POA: Diagnosis not present

## 2022-09-13 DIAGNOSIS — M1711 Unilateral primary osteoarthritis, right knee: Secondary | ICD-10-CM | POA: Diagnosis not present

## 2022-09-13 DIAGNOSIS — M25561 Pain in right knee: Secondary | ICD-10-CM | POA: Diagnosis not present

## 2022-09-19 DIAGNOSIS — M25561 Pain in right knee: Secondary | ICD-10-CM | POA: Diagnosis not present

## 2022-09-19 DIAGNOSIS — M1711 Unilateral primary osteoarthritis, right knee: Secondary | ICD-10-CM | POA: Diagnosis not present

## 2022-09-26 DIAGNOSIS — M25561 Pain in right knee: Secondary | ICD-10-CM | POA: Diagnosis not present

## 2022-09-26 DIAGNOSIS — M1711 Unilateral primary osteoarthritis, right knee: Secondary | ICD-10-CM | POA: Diagnosis not present

## 2022-09-30 DIAGNOSIS — M1711 Unilateral primary osteoarthritis, right knee: Secondary | ICD-10-CM | POA: Diagnosis not present

## 2022-09-30 DIAGNOSIS — M25561 Pain in right knee: Secondary | ICD-10-CM | POA: Diagnosis not present

## 2022-10-03 DIAGNOSIS — I10 Essential (primary) hypertension: Secondary | ICD-10-CM | POA: Diagnosis not present

## 2022-10-04 DIAGNOSIS — M1711 Unilateral primary osteoarthritis, right knee: Secondary | ICD-10-CM | POA: Diagnosis not present

## 2022-10-04 DIAGNOSIS — M25561 Pain in right knee: Secondary | ICD-10-CM | POA: Diagnosis not present

## 2022-10-06 DIAGNOSIS — M542 Cervicalgia: Secondary | ICD-10-CM | POA: Diagnosis not present

## 2022-10-06 DIAGNOSIS — M47812 Spondylosis without myelopathy or radiculopathy, cervical region: Secondary | ICD-10-CM | POA: Diagnosis not present

## 2022-10-08 DIAGNOSIS — R0981 Nasal congestion: Secondary | ICD-10-CM | POA: Diagnosis not present

## 2022-10-08 DIAGNOSIS — R051 Acute cough: Secondary | ICD-10-CM | POA: Diagnosis not present

## 2022-10-08 DIAGNOSIS — Z03818 Encounter for observation for suspected exposure to other biological agents ruled out: Secondary | ICD-10-CM | POA: Diagnosis not present

## 2022-10-08 DIAGNOSIS — J069 Acute upper respiratory infection, unspecified: Secondary | ICD-10-CM | POA: Diagnosis not present

## 2022-10-08 DIAGNOSIS — J029 Acute pharyngitis, unspecified: Secondary | ICD-10-CM | POA: Diagnosis not present

## 2022-11-25 DIAGNOSIS — R002 Palpitations: Secondary | ICD-10-CM | POA: Diagnosis not present

## 2022-11-25 DIAGNOSIS — R531 Weakness: Secondary | ICD-10-CM | POA: Diagnosis not present

## 2022-11-25 DIAGNOSIS — E7800 Pure hypercholesterolemia, unspecified: Secondary | ICD-10-CM | POA: Diagnosis not present

## 2022-11-25 DIAGNOSIS — I10 Essential (primary) hypertension: Secondary | ICD-10-CM | POA: Diagnosis not present

## 2022-11-28 DIAGNOSIS — H40023 Open angle with borderline findings, high risk, bilateral: Secondary | ICD-10-CM | POA: Diagnosis not present

## 2022-11-28 DIAGNOSIS — H04123 Dry eye syndrome of bilateral lacrimal glands: Secondary | ICD-10-CM | POA: Diagnosis not present

## 2022-11-28 DIAGNOSIS — Z961 Presence of intraocular lens: Secondary | ICD-10-CM | POA: Diagnosis not present

## 2022-12-01 ENCOUNTER — Ambulatory Visit (INDEPENDENT_AMBULATORY_CARE_PROVIDER_SITE_OTHER): Payer: Medicare Other

## 2022-12-01 ENCOUNTER — Ambulatory Visit: Payer: Medicare Other | Attending: Internal Medicine | Admitting: Internal Medicine

## 2022-12-01 ENCOUNTER — Encounter: Payer: Self-pay | Admitting: Internal Medicine

## 2022-12-01 VITALS — BP 130/64 | HR 60 | Ht 63.0 in | Wt 189.0 lb

## 2022-12-01 DIAGNOSIS — E782 Mixed hyperlipidemia: Secondary | ICD-10-CM | POA: Diagnosis not present

## 2022-12-01 DIAGNOSIS — R002 Palpitations: Secondary | ICD-10-CM

## 2022-12-01 DIAGNOSIS — R079 Chest pain, unspecified: Secondary | ICD-10-CM | POA: Diagnosis not present

## 2022-12-01 DIAGNOSIS — I10 Essential (primary) hypertension: Secondary | ICD-10-CM

## 2022-12-01 NOTE — Progress Notes (Unsigned)
Applied a 14 day Zio XT applied in office

## 2022-12-01 NOTE — Patient Instructions (Signed)
Medication Instructions:  Your physician recommends that you continue on your current medications as directed. Please refer to the Current Medication list given to you today.  *If you need a refill on your cardiac medications before your next appointment, please call your pharmacy*   Lab Work: NONE If you have labs (blood work) drawn today and your tests are completely normal, you will receive your results only by: [place] (if you have MyChart) OR A paper copy in the mail If you have any lab test that is abnormal or we need to change your treatment, we will call you to review the results.   Testing/Procedures: Your physician has requested that you wear a heart monitor.    Follow-Up: At [facility], you and your health needs are our priority.  As part of our continuing mission to provide you with exceptional heart care, we have created designated Provider Care Teams.  These Care Teams include your primary Cardiologist (physician) and Advanced Practice Providers (APPs -  Physician Assistants and Nurse Practitioners) who all work together to provide you with the care you need, when you need it.  We recommend signing up for the patient portal called "MyChart".  Sign up information is provided on this After Visit Summary.  MyChart is used to connect with patients for Virtual Visits (Telemedicine).  Patients are able to view lab/test results, encounter notes, upcoming appointments, etc.  Non-urgent messages can be sent to your provider as well.   To learn more about what you can do with MyChart, go to [email or web address].    Your next appointment:   6 month(s)  Provider:   Rudean Haskell, MD    Other Instructions Carmen Fischer- Long Term Monitor Instructions  Your physician has requested you wear a ZIO patch monitor for 14 days.  This is a single patch monitor. [employer] supplies one patch monitor per enrollment. Additional stickers are not available. Please do not  apply patch if you will be having a Nuclear Stress Test,  Echocardiogram, Cardiac CT, MRI, or Chest Xray during the period you would be wearing the  monitor. The patch cannot be worn during these tests. You cannot remove and re-apply the  ZIO XT patch monitor.  Your ZIO patch monitor will be mailed 3 day USPS to your address on file. It may take 3-5 days  to receive your monitor after you have been enrolled.  Once you have received your monitor, please review the enclosed instructions. Your monitor  has already been registered assigning a specific monitor serial # to you.  Billing and Patient Assistance Program Information  We have supplied [employer] with any of your insurance information on file for billing purposes. [employer] offers a sliding scale Patient Assistance Program for patients that do not have  insurance, or whose insurance does not completely cover the cost of the ZIO monitor.  You must apply for the Patient Assistance Program to qualify for this discounted rate.  To apply, please call [employer] at [phone], select option 4, select option 2, ask to apply for  Patient Assistance Program. Carmen Fischer will ask your household income, and how many people  are in your household. They will quote your out-of-pocket cost based on that information.  [employer] will also be able to set up a 33-month interest-free payment plan if needed.  Applying the monitor   Shave hair from upper left chest.  Hold abrader disc by orange tab. Rub abrader in 40 strokes over the upper left chest as  indicated  in your monitor instructions.  Clean area with 4 enclosed alcohol pads. Let dry.  Apply patch as indicated in monitor instructions. Patch will be placed under collarbone on left  side of chest with arrow pointing upward.  Rub patch adhesive wings for 2 minutes. Remove white label marked "1". Remove the white  label marked "2". Rub patch adhesive wings for 2 additional minutes.  While looking in a mirror,  press and release button in center of patch. A small green light will  flash 3-4 times. This will be your only indicator that the monitor has been turned on.  Do not shower for the first 24 hours. You may shower after the first 24 hours.  Press the button if you feel a symptom. You will hear a small click. Record Date, Time and  Symptom in the Patient Logbook.  When you are ready to remove the patch, follow instructions on the last 2 pages of Patient  Logbook. Stick patch monitor onto the last page of Patient Logbook.  Place Patient Logbook in the blue and white box. Use locking tab on box and tape box closed  securely. The blue and white box has prepaid postage on it. Please place it in the mailbox as  soon as possible. Your physician should have your test results approximately 7 days after the  monitor has been mailed back to [facility].  Call [place] at [phone] if you have questions regarding  your ZIO XT patch monitor. Call them immediately if you see an orange light blinking on your  monitor.  If your monitor falls off in less than 4 days, contact our Monitor department at [phone].  If your monitor becomes loose or falls off after 4 days call [employer] at [phone] for  suggestions on securing your monitor

## 2022-12-01 NOTE — Progress Notes (Signed)
Cardiology Office Note:    Date:  12/01/2022   ID:  Carmen Fischer, DOB September 17, 1941, MRN [id number]  PCP:  Carmen Fischer, [place] [facility] Cardiologist:  Carmen Dawley, MD     Referring MD: Carmen Arabian, MD   CC:  Transition to new cardiologist  History of Present Illness:    Carmen Fischer is a 82 y.o. female with a hx of HTN, HLD who presents for follow up. 2021: Normal Echo and Cath.  Patient notes that she is having problems in the middle of her back.   Since last visit notes that when she is just sitting she feels like her heart is beating extra fast. When this occurs she can feel it in her heart.  When she has palpitations she had light headedness. No SOB/DOE and no PND/Orthopnea.  No weight gain or leg swelling.  Is able to mop the floor but feels some fatigue.  Goes to [employer]. Goes to Mass on Wednesday and Sunday. Used to walk in the summer has not been doing it lately.    Past Medical History:  Diagnosis Date   Arthritis    Glaucoma    High cholesterol    High cholesterol    Hypertension     Past Surgical History:  Procedure Laterality Date   ABDOMINAL HYSTERECTOMY     CHOLECYSTECTOMY     LEFT HEART CATH AND CORONARY ANGIOGRAPHY N/A 03/10/2020   Procedure: LEFT HEART CATH AND CORONARY ANGIOGRAPHY;  Surgeon: Martinique, Peter M, MD;  Location: [facility];  Service: Cardiovascular;  Laterality: N/A;    Current Medications: Current Meds  Medication Sig   aspirin EC 81 MG tablet Take 81 mg by mouth daily.   cholecalciferol (VITAMIN D3) 25 MCG (1000 UNIT) tablet daily.   cycloSPORINE (RESTASIS) 0.05 % ophthalmic emulsion Place 1 drop into both eyes 2 (two) times daily.   desloratadine (CLARINEX) 5 MG tablet Take 5 mg by mouth daily.   hydrochlorothiazide (HYDRODIURIL) 25 MG tablet Take 25 mg by mouth daily.   KLOR-CON M20 20 MEQ tablet daily.   Multiple Vitamin (MULTIVITAMIN WITH MINERALS) TABS tablet Take 1 tablet by mouth  daily.   omeprazole (PRILOSEC) 20 MG capsule Take 20 mg by mouth daily.   OVER THE COUNTER MEDICATION Take 1 tablet by mouth daily. Hydroeye Vitamin   polyethylene glycol (MIRALAX / GLYCOLAX) 17 g packet Take 17 g by mouth daily as needed for moderate constipation.    simvastatin (ZOCOR) 20 MG tablet Take 20 mg by mouth every evening.    spironolactone (ALDACTONE) 25 MG tablet Take 0.5 tablets (12.5 mg total) by mouth daily.   Travoprost, BAK Free, (TRAVATAN) 0.004 % SOLN ophthalmic solution Place 1 drop into both eyes at bedtime.   vitamin E 180 MG (400 UNITS) capsule daily.     Allergies:   Shellfish allergy and Prednisone   Social History   Socioeconomic History   Marital status: Married    Spouse name: Not on file   Number of children: Not on file   Years of education: Not on file   Highest education level: Not on file  Occupational History   Not on file  Tobacco Use   Smoking status: Never   Smokeless tobacco: Never  Substance and Sexual Activity   Alcohol use: Yes    Comment: rarely   Drug use: Never   Sexual activity: Not on file  Other Topics Concern   Not on file  Social History Narrative   Not on file   Social Determinants of Health   Financial Resource Strain: Not on file  Food Insecurity: Not on file  Transportation Needs: Not on file  Physical Activity: Not on file  Stress: Not on file  Social Connections: Not on file     Family History: The patient's family history includes Heart attack in her father and mother.  ROS:   Please see the history of present illness.     All other systems reviewed and are negative.  EKGs/Labs/Other Studies Reviewed:    The following studies were reviewed today:  Cardiac Studies & Procedures   CARDIAC CATHETERIZATION  CARDIAC CATHETERIZATION 03/10/2020  Narrative  The left ventricular systolic function is normal.  LV end diastolic pressure is normal.  The left ventricular ejection fraction is 55-65% by visual  estimate.  1.  Normal coronary anatomy 2. Normal LV function 3. Normal LVEDP  Plan: consider other causes of chest pain.  Findings Coronary Findings Diagnostic  Dominance: Right  Left Main Vessel was injected. Vessel is normal in caliber. Vessel is angiographically normal.  Left Anterior Descending Vessel was injected. Vessel is normal in caliber. Vessel is angiographically normal.  Ramus Intermedius Vessel was injected. Vessel is normal in caliber. Vessel is angiographically normal.  Left Circumflex Vessel was injected. Vessel is normal in caliber. Vessel is angiographically normal.  Right Coronary Artery Vessel was injected. Vessel is normal in caliber. Vessel is angiographically normal.  Intervention  No interventions have been documented.   STRESS TESTS  NM MYOCAR MULTI W/SPECT W 11/19/2014  Narrative CLINICAL DATA:  82 year old with chest pain, unspecified chest pain type.  EXAM: MYOCARDIAL IMAGING WITH SPECT (REST AND PHARMACOLOGIC-STRESS)  GATED LEFT VENTRICULAR WALL MOTION STUDY  LEFT VENTRICULAR EJECTION FRACTION  TECHNIQUE: Standard myocardial SPECT imaging was performed after resting intravenous injection of 10 mCi Tc-51msestamibi. Subsequently, intravenous infusion of Lexiscan was performed under the supervision of the Cardiology staff. At peak effect of the drug, 30 mCi Tc-93mestamibi was injected intravenously and standard myocardial SPECT imaging was performed. Quantitative gated imaging was also performed to evaluate left ventricular wall motion, and estimate left ventricular ejection fraction.  COMPARISON:  12/23/2012  FINDINGS: Perfusion: No decreased activity in the left ventricle on stress imaging to suggest reversible ischemia or infarction.  Wall Motion: Normal left ventricular wall motion. No left ventricular dilation.  Left Ventricular Ejection Fraction: 80 %  End diastolic volume 54 ml  End systolic volume 11  ml  IMPRESSION: 1. No reversible ischemia or infarction.  2. Normal left ventricular wall motion.  3. Left ventricular ejection fraction is 80%.  4. Low-risk stress test findings*.  *2012 Appropriate Use Criteria for Coronary Revascularization Focused Update: J Am Coll Cardiol. 207169;67(8):938-101http://content.onairportbarriers.comspx?articleid=[email or web address]   Electronically Signed By: AdMarkus Daft.D. On: 11/19/2014 14:50   ECHOCARDIOGRAM  ECHOCARDIOGRAM COMPLETE 03/25/2020  Narrative ECHOCARDIOGRAM REPORT    Patient Name:   EUDENAE ZULUETA Date of Exam: 03/25/2020 Medical Rec #:  [id number]   Height:       63.0 in Accession #:    [id number]  Weight:       175.0 lb Date of Birth:  4/07-28-1942   BSA:          1.827 Fischer Patient Age:    [age]      BP:           122/72 mmHg Patient Gender: F  HR:           52 bpm. Exam Location:  [facility]  Procedure: 2D Echo, Cardiac Doppler and Color Doppler  Indications:    R07.9* Chest pain, unspecified  History:        Patient has no prior history of Echocardiogram examinations. Risk Factors:Hypertension and Dyslipidemia.  Sonographer:    Diamond Nickel RCS Referring Phys: [id number] [place]   1. Left ventricular ejection fraction, by estimation, is 60 to 65%. The left ventricle has normal function. The left ventricle has no regional wall motion abnormalities. There is mild left ventricular hypertrophy. Left ventricular diastolic parameters were normal. 2. Right ventricular systolic function is normal. The right ventricular size is normal. There is normal pulmonary artery systolic pressure. The estimated right ventricular systolic pressure is 40.3 mmHg. 3. The mitral valve is normal in structure. Trivial mitral valve regurgitation. 4. The aortic valve is tricuspid. Aortic valve regurgitation is not visualized. No aortic stenosis is present. 5. The inferior vena cava is normal in size with  greater than 50% respiratory variability, suggesting right atrial pressure of 3 mmHg.  FINDINGS Left Ventricle: Left ventricular ejection fraction, by estimation, is 60 to 65%. The left ventricle has normal function. The left ventricle has no regional wall motion abnormalities. The left ventricular internal cavity size was normal in size. There is mild left ventricular hypertrophy. Left ventricular diastolic parameters were normal.  Right Ventricle: The right ventricular size is normal. No increase in right ventricular wall thickness. Right ventricular systolic function is normal. There is normal pulmonary artery systolic pressure. The tricuspid regurgitant velocity is 2.57 Fischer/s, and with an assumed right atrial pressure of 3 mmHg, the estimated right ventricular systolic pressure is 47.4 mmHg.  Left Atrium: Left atrial size was normal in size.  Right Atrium: Right atrial size was normal in size.  Pericardium: There is no evidence of pericardial effusion.  Mitral Valve: The mitral valve is normal in structure. Trivial mitral valve regurgitation.  Tricuspid Valve: The tricuspid valve is normal in structure. Tricuspid valve regurgitation is trivial.  Aortic Valve: The aortic valve is tricuspid. Aortic valve regurgitation is not visualized. No aortic stenosis is present.  Pulmonic Valve: The pulmonic valve was grossly normal. Pulmonic valve regurgitation is not visualized.  Aorta: The aortic root and ascending aorta are structurally normal, with no evidence of dilitation.  Venous: The inferior vena cava is normal in size with greater than 50% respiratory variability, suggesting right atrial pressure of 3 mmHg.  IAS/Shunts: The interatrial septum was not well visualized.   LEFT VENTRICLE PLAX 2D LVIDd:         3.70 cm  Diastology LVIDs:         2.10 cm  LV e' lateral:   6.42 cm/s LV PW:         1.10 cm  LV E/e' lateral: 8.1 LV IVS:        1.20 cm  LV e' medial:    7.62 cm/s LVOT diam:      1.90 cm  LV E/e' medial:  6.8 LV SV:         80 LV SV Index:   44 LVOT Area:     2.84 cm   RIGHT VENTRICLE RV Basal diam:  2.50 cm RV S prime:     9.79 cm/s TAPSE (Fischer-mode): 2.6 cm RVSP:           29.4 mmHg  LEFT ATRIUM  Index       RIGHT ATRIUM           Index LA diam:        3.50 cm 1.92 cm/Fischer  RA Pressure: 3.00 mmHg LA Vol (A2C):   35.6 ml 19.49 ml/Fischer RA Area:     9.83 cm LA Vol (A4C):   33.2 ml 18.17 ml/Fischer RA Volume:   16.10 ml  8.81 ml/Fischer LA Biplane Vol: 36.9 ml 20.20 ml/Fischer AORTIC VALVE LVOT Vmax:   107.00 cm/s LVOT Vmean:  67.500 cm/s LVOT VTI:    0.282 Fischer  AORTA Ao Root diam: 2.60 cm  MITRAL VALVE               TRICUSPID VALVE MV Area (PHT): 2.95 cm    TR Peak grad:   26.4 mmHg MV Decel Time: 257 msec    TR Vmax:        257.00 cm/s MV E velocity: 51.80 cm/s  Estimated RAP:  3.00 mmHg MV A velocity: 99.40 cm/s  RVSP:           29.4 mmHg MV E/A ratio:  0.52 SHUNTS Systemic VTI:  0.28 Fischer Systemic Diam: 1.90 cm  Oswaldo Milian MD Electronically signed by Oswaldo Milian MD Signature Date/Time: 03/25/2020/9:58:15 PM    Final              Recent Labs: 02/27/2022: ALT 24; Magnesium 2.4 07/31/2022: BUN 12; Creatinine, Ser 1.01; Hemoglobin 12.8; Platelets 249; Potassium 3.4; Sodium 135  Recent Lipid Panel No results found for: "CHOL", "TRIG", "HDL", "CHOLHDL", "VLDL", "LDLCALC", "LDLDIRECT"   Physical Exam:    VS:  BP 130/64   Pulse 60   Ht '5\' 3"'$  (1.6 Fischer)   Wt 189 lb (85.7 kg)   SpO2 99%   BMI 33.48 kg/Fischer     Wt Readings from Last 3 Encounters:  12/01/22 189 lb (85.7 kg)  07/31/22 182 lb 1.6 oz (82.6 kg)  02/27/22 182 lb (82.6 kg)    GEN:  Well nourished, well developed in no acute distress HEENT: Normal NECK: No JVD LYMPHATICS: No lymphadenopathy CARDIAC: RRR, no murmurs, rubs, gallops RESPIRATORY:  Clear to auscultation without rales, wheezing or rhonchi  ABDOMEN: Soft, non-tender, non-distended MUSCULOSKELETAL:  No edema;  No deformity  SKIN: Warm and dry NEUROLOGIC:  Alert and oriented x 3 PSYCHIATRIC:  Normal affect   ASSESSMENT:    1. Palpitations   2. Essential hypertension   3. Mixed hyperlipidemia    PLAN:    Palpitations - two week non live zio patch  HTN - continue MRA and HCTZ  HLD - continue statin  Chest pain syndrome - appears non cardiac with normal cath and echo - discussed exercise strength and conditioning - if worsening sx would off PET MPI for microvascular disease   Six months me or APP       Medication Adjustments/Labs and Tests Ordered: Current medicines are reviewed at length with the patient today.  Concerns regarding medicines are outlined above.  Orders Placed This Encounter  Procedures   LONG TERM MONITOR (3-14 DAYS)   No orders of the defined types were placed in this encounter.   Patient Instructions  Medication Instructions:  Your physician recommends that you continue on your current medications as directed. Please refer to the Current Medication list given to you today.  *If you need a refill on your cardiac medications before your next appointment, please call your pharmacy*   Lab Work: NONE If you have labs (  blood work) drawn today and your tests are completely normal, you will receive your results only by: [place] (if you have MyChart) OR A paper copy in the mail If you have any lab test that is abnormal or we need to change your treatment, we will call you to review the results.   Testing/Procedures: Your physician has requested that you wear a heart monitor.    Follow-Up: At [facility], you and your health needs are our priority.  As part of our continuing mission to provide you with exceptional heart care, we have created designated Provider Care Teams.  These Care Teams include your primary Cardiologist (physician) and Advanced Practice Providers (APPs -  Physician Assistants and Nurse Practitioners) who all work  together to provide you with the care you need, when you need it.  We recommend signing up for the patient portal called "MyChart".  Sign up information is provided on this After Visit Summary.  MyChart is used to connect with patients for Virtual Visits (Telemedicine).  Patients are able to view lab/test results, encounter notes, upcoming appointments, etc.  Non-urgent messages can be sent to your provider as well.   To learn more about what you can do with MyChart, go to [email or web address].    Your next appointment:   6 month(s)  Provider:   Rudean Haskell, MD    Other Instructions [name]- Long Term Monitor Instructions  Your physician has requested you wear a ZIO patch monitor for 14 days.  This is a single patch monitor. [employer] supplies one patch monitor per enrollment. Additional stickers are not available. Please do not apply patch if you will be having a Nuclear Stress Test,  Echocardiogram, Cardiac CT, MRI, or Chest Xray during the period you would be wearing the  monitor. The patch cannot be worn during these tests. You cannot remove and re-apply the  ZIO XT patch monitor.  Your ZIO patch monitor will be mailed 3 day USPS to your address on file. It may take 3-5 days  to receive your monitor after you have been enrolled.  Once you have received your monitor, please review the enclosed instructions. Your monitor  has already been registered assigning a specific monitor serial # to you.  Billing and Patient Assistance Program Information  We have supplied [employer] with any of your insurance information on file for billing purposes. [employer] offers a sliding scale Patient Assistance Program for patients that do not have  insurance, or whose insurance does not completely cover the cost of the ZIO monitor.  You must apply for the Patient Assistance Program to qualify for this discounted rate.  To apply, please call [employer] at [phone], select option 4, select option  2, ask to apply for  Patient Assistance Program. [name] will ask your household income, and how many people  are in your household. They will quote your out-of-pocket cost based on that information.  [employer] will also be able to set up a 68-month interest-free payment plan if needed.  Applying the monitor   Shave hair from upper left chest.  Hold abrader disc by orange tab. Rub abrader in 40 strokes over the upper left chest as  indicated in your monitor instructions.  Clean area with 4 enclosed alcohol pads. Let dry.  Apply patch as indicated in monitor instructions. Patch will be placed under collarbone on left  side of chest with arrow pointing upward.  Rub patch adhesive wings for 2 minutes. Remove white label marked "1". Remove  the white  label marked "2". Rub patch adhesive wings for 2 additional minutes.  While looking in a mirror, press and release button in center of patch. A small green light will  flash 3-4 times. This will be your only indicator that the monitor has been turned on.  Do not shower for the first 24 hours. You may shower after the first 24 hours.  Press the button if you feel a symptom. You will hear a small click. Record Date, Time and  Symptom in the Patient Logbook.  When you are ready to remove the patch, follow instructions on the last 2 pages of Patient  Logbook. Stick patch monitor onto the last page of Patient Logbook.  Place Patient Logbook in the blue and white box. Use locking tab on box and tape box closed  securely. The blue and white box has prepaid postage on it. Please place it in the mailbox as  soon as possible. Your physician should have your test results approximately 7 days after the  monitor has been mailed back to [facility].  Call [place] at [phone] if you have questions regarding  your ZIO XT patch monitor. Call them immediately if you see an orange light blinking on your  monitor.  If your monitor falls  off in less than 4 days, contact our Monitor department at [phone].  If your monitor becomes loose or falls off after 4 days call [employer] at [phone] for  suggestions on securing your monitor     Signed, Werner Lean, MD  12/01/2022 12:23 PM    [place]

## 2022-12-22 DIAGNOSIS — R002 Palpitations: Secondary | ICD-10-CM | POA: Diagnosis not present

## 2023-01-02 DIAGNOSIS — M1711 Unilateral primary osteoarthritis, right knee: Secondary | ICD-10-CM | POA: Diagnosis not present

## 2023-01-13 DIAGNOSIS — M1711 Unilateral primary osteoarthritis, right knee: Secondary | ICD-10-CM | POA: Diagnosis not present

## 2023-01-16 ENCOUNTER — Other Ambulatory Visit (HOSPITAL_BASED_OUTPATIENT_CLINIC_OR_DEPARTMENT_OTHER): Payer: Self-pay

## 2023-01-16 ENCOUNTER — Other Ambulatory Visit: Payer: Self-pay

## 2023-01-16 ENCOUNTER — Emergency Department (HOSPITAL_BASED_OUTPATIENT_CLINIC_OR_DEPARTMENT_OTHER)
Admission: EM | Admit: 2023-01-16 | Discharge: 2023-01-16 | Disposition: A | Discharge status: Home / Self Care | Payer: Medicare Other | Attending: Emergency Medicine | Admitting: Emergency Medicine

## 2023-01-16 ENCOUNTER — Emergency Department (HOSPITAL_BASED_OUTPATIENT_CLINIC_OR_DEPARTMENT_OTHER): Payer: Medicare Other

## 2023-01-16 ENCOUNTER — Encounter (HOSPITAL_BASED_OUTPATIENT_CLINIC_OR_DEPARTMENT_OTHER): Payer: Self-pay | Admitting: Emergency Medicine

## 2023-01-16 DIAGNOSIS — Z7982 Long term (current) use of aspirin: Secondary | ICD-10-CM | POA: Insufficient documentation

## 2023-01-16 DIAGNOSIS — Z79899 Other long term (current) drug therapy: Secondary | ICD-10-CM | POA: Diagnosis not present

## 2023-01-16 DIAGNOSIS — R42 Dizziness and giddiness: Secondary | ICD-10-CM | POA: Diagnosis not present

## 2023-01-16 DIAGNOSIS — R791 Abnormal coagulation profile: Secondary | ICD-10-CM | POA: Diagnosis not present

## 2023-01-16 DIAGNOSIS — R519 Headache, unspecified: Secondary | ICD-10-CM | POA: Diagnosis not present

## 2023-01-16 DIAGNOSIS — I10 Essential (primary) hypertension: Secondary | ICD-10-CM | POA: Diagnosis not present

## 2023-01-16 LAB — COMPREHENSIVE METABOLIC PANEL
ALT: 15 U/L (ref 0–44)
AST: 21 U/L (ref 15–41)
Albumin: 3.8 g/dL (ref 3.5–5.0)
Alkaline Phosphatase: 62 U/L (ref 38–126)
Anion gap: 7 (ref 5–15)
BUN: 12 mg/dL (ref 8–23)
CO2: 29 mmol/L (ref 22–32)
Calcium: 9.8 mg/dL (ref 8.9–10.3)
Chloride: 98 mmol/L (ref 98–111)
Creatinine, Ser: 0.83 mg/dL (ref 0.44–1.00)
GFR, Estimated: >60 mL/min (ref >60)
Glucose, Bld: 108 mg/dL — ABNORMAL HIGH (ref 70–99)
Potassium: 3.8 mmol/L (ref 3.5–5.1)
Sodium: 134 mmol/L — ABNORMAL LOW (ref 135–145)
Total Bilirubin: 0.4 mg/dL (ref 0.3–1.2)
Total Protein: 7.4 g/dL (ref 6.5–8.1)

## 2023-01-16 LAB — ETHANOL: Alcohol, Ethyl (B): <10 mg/dL (ref <10)

## 2023-01-16 LAB — DIFFERENTIAL
Abs Immature Granulocytes: 0.01 10*3/uL (ref 0.00–0.07)
Basophils Absolute: 0 10*3/uL (ref 0.0–0.1)
Basophils Relative: 1 %
Eosinophils Absolute: 0 10*3/uL (ref 0.0–0.5)
Eosinophils Relative: 1 %
Immature Granulocytes: 0 %
Lymphocytes Relative: 19 %
Lymphs Abs: 1.1 10*3/uL (ref 0.7–4.0)
Monocytes Absolute: 0.4 10*3/uL (ref 0.1–1.0)
Monocytes Relative: 8 %
Neutro Abs: 4 10*3/uL (ref 1.7–7.7)
Neutrophils Relative %: 71 %

## 2023-01-16 LAB — CBC
HCT: 39.4 % (ref 36.0–46.0)
Hemoglobin: 12.9 g/dL (ref 12.0–15.0)
MCH: 27.5 pg (ref 26.0–34.0)
MCHC: 32.7 g/dL (ref 30.0–36.0)
MCV: 84 fL (ref 80.0–100.0)
Platelets: 243 10*3/uL (ref 150–400)
RBC: 4.69 MIL/uL (ref 3.87–5.11)
RDW: 14.3 % (ref 11.5–15.5)
WBC: 5.6 10*3/uL (ref 4.0–10.5)
nRBC: 0 % (ref 0.0–0.2)

## 2023-01-16 LAB — CBG MONITORING, ED: Glucose-Capillary: 112 mg/dL — ABNORMAL HIGH (ref 70–99)

## 2023-01-16 LAB — APTT: aPTT: 31 seconds (ref 24–36)

## 2023-01-16 LAB — PROTIME-INR
INR: 1.1 (ref 0.8–1.2)
Prothrombin Time: 13.7 seconds (ref 11.4–15.2)

## 2023-01-16 MED ORDER — SODIUM CHLORIDE 0.9% FLUSH: 3.0000 mL | Freq: Once | INTRAVENOUS | Status: DC

## 2023-01-16 NOTE — ED Notes (Signed)
Patient given discharge instructions. Questions were answered. Patient verbalized understanding of discharge instructions and care at home.  

## 2023-01-16 NOTE — ED Provider Notes (Signed)
I provided a substantive portion of the care of this patient.  I personally made/approved the management plan for this patient and take responsibility for the patient management. {Remember to document shared critical care using "edcritical" dot phrase:1}    

## 2023-01-16 NOTE — ED Triage Notes (Signed)
Pt arrives to ED with c/o HTN, dizziness, feeling off-balance, headache, and bilateral leg weakness. She notes she woke up with these symptoms. LKW 1000pm on 3/17. She notes BP up to 170s at home. She reports dizziness as resolved.

## 2023-01-16 NOTE — ED Provider Notes (Signed)
[facility] Provider Note   CSN: [id number] Arrival date & time: 01/16/23  1220     History  Chief Complaint  Patient presents with   Hypertension   Headache   Dizziness    Carmen Fischer is a 82 y.o. female with past medical history significant for hypertension, hyperlipidemia, glaucoma, arthritis who presents with concern for high blood pressure, dizziness, feeling off balance, headache, and generalized weakness this morning.  Patient reports that she woke up with the symptoms, last known well 10 PM yesterday.  She reports that she had blood pressure max of 779 systolic.  She denies any unilateral weakness, numbness, chest pain, shortness of breath, vision changes.   Hypertension Associated symptoms include headaches.  Headache Associated symptoms: dizziness   Dizziness Associated symptoms: headaches        Home Medications Prior to Admission medications   Medication Sig Start Date End Date Taking? Authorizing Provider  aspirin EC 81 MG tablet Take 81 mg by mouth daily.    [provider]  cholecalciferol (VITAMIN D3) 25 MCG (1000 UNIT) tablet daily.    [provider]  cycloSPORINE (RESTASIS) 0.05 % ophthalmic emulsion Place 1 drop into both eyes 2 (two) times daily.    [provider]  desloratadine (CLARINEX) 5 MG tablet Take 5 mg by mouth daily. 02/13/20   [provider]  hydrochlorothiazide (HYDRODIURIL) 25 MG tablet Take 25 mg by mouth daily.    [provider]  KLOR-CON M20 20 MEQ tablet daily. 02/14/20   [provider]  Multiple Vitamin (MULTIVITAMIN WITH MINERALS) TABS tablet Take 1 tablet by mouth daily.    [provider]  omeprazole (PRILOSEC) 20 MG capsule Take 20 mg by mouth daily.    [provider]  OVER THE COUNTER MEDICATION Take 1 tablet by mouth daily. Hydroeye Vitamin    [provider]  polyethylene glycol (MIRALAX / GLYCOLAX) 17 g  packet Take 17 g by mouth daily as needed for moderate constipation.     [provider]  simvastatin (ZOCOR) 20 MG tablet Take 20 mg by mouth every evening.     [provider]  spironolactone (ALDACTONE) 25 MG tablet Take 0.5 tablets (12.5 mg total) by mouth daily. 03/04/20   Dorothy Spark, MD  Travoprost, BAK Free, (TRAVATAN) 0.004 % SOLN ophthalmic solution Place 1 drop into both eyes at bedtime.    [provider]  vitamin E 180 MG (400 UNITS) capsule daily.    [provider]      Allergies    Shellfish allergy and Prednisone    Review of Systems   Review of Systems  Neurological:  Positive for dizziness and headaches.  All other systems reviewed and are negative.   Physical Exam Updated Vital Signs BP 129/67   Pulse 63   Temp 98.1 F (36.7 C) (Oral)   Resp 13   Ht 5\' 3"  (1.6 m)   Wt 83 kg   SpO2 97%   BMI 32.42 kg/m  Physical Exam Vitals and nursing note reviewed.  Constitutional:      General: She is not in acute distress.    Appearance: Normal appearance.  HENT:     Head: Normocephalic and atraumatic.  Eyes:     General:        Right eye: No discharge.        Left eye: No discharge.  Cardiovascular:     Rate and Rhythm: Normal rate and  regular rhythm.     Heart sounds: No murmur heard.    No friction rub. No gallop.  Pulmonary:     Effort: Pulmonary effort is normal.     Breath sounds: Normal breath sounds.  Abdominal:     General: Bowel sounds are normal.     Palpations: Abdomen is soft.  Skin:    General: Skin is warm and dry.     Capillary Refill: Capillary refill takes less than 2 seconds.  Neurological:     Mental Status: She is alert and oriented to person, place, and time.     Comments: Cranial nerves II through XII grossly intact.  Intact finger-nose, intact heel-to-shin.  Romberg negative, gait normal.  Alert and oriented x3.  Moves all 4 limbs spontaneously, normal coordination.  No pronator drift.  Intact  strength 5 out of 5 bilateral upper and lower extremities.    Psychiatric:        Mood and Affect: Mood normal.        Behavior: Behavior normal.     ED Results / Procedures / Treatments   Labs (all labs ordered are listed, but only abnormal results are displayed) Labs Reviewed  COMPREHENSIVE METABOLIC PANEL - Abnormal; Notable for the following components:      Result Value   Sodium 134 (*)    Glucose, Bld 108 (*)    All other components within normal limits  CBG MONITORING, ED - Abnormal; Notable for the following components:   Glucose-Capillary 112 (*)    All other components within normal limits  PROTIME-INR  APTT  CBC  DIFFERENTIAL  ETHANOL    EKG None  Radiology CT HEAD WO CONTRAST  Result Date: 01/16/2023 CLINICAL DATA:  Headache, new onset (Age >= 64y) EXAM: CT HEAD WITHOUT CONTRAST TECHNIQUE: Contiguous axial images were obtained from the base of the skull through the vertex without intravenous contrast. RADIATION DOSE REDUCTION: This exam was performed according to the departmental dose-optimization program which includes automated exposure control, adjustment of the mA and/or kV according to patient size and/or use of iterative reconstruction technique. COMPARISON:  07/31/2022, 02/08/2020 FINDINGS: Brain: No evidence of acute infarction, hemorrhage, hydrocephalus, extra-axial collection or mass lesion/mass effect. Prominent extra-axial space in the high right parietal region is unchanged. Vascular: No hyperdense vessel or unexpected calcification. Skull: Normal. Negative for fracture or focal lesion. Sinuses/Orbits: Partial bilateral mastoid effusions. Paranasal sinuses are clear. Other: None. IMPRESSION: 1. No acute intracranial abnormality. 2. Partial bilateral mastoid effusions. Electronically Signed   By: Davina Poke D.O.   On: 01/16/2023 13:53    Procedures Procedures    Medications Ordered in ED Medications  sodium chloride flush (NS) 0.9 % injection 3  mL (has no administration in time range)    ED Course/ Medical Decision Making/ A&P                             Medical Decision Making Amount and/or Complexity of Data Reviewed Labs: ordered. Radiology: ordered.   This patient is a 82 y.o. female who presents to the ED for concern of weakness, dizziness, this involves an extensive number of treatment options, and is a complaint that carries with it a high risk of complications and morbidity. The emergent differential diagnosis prior to evaluation includes, but is not limited to,  CVA, spinal cord injury, ACS, arrhythmia, syncope, orthostatic hypotension, sepsis, hypoglycemia, hypoxia, electrolyte disturbance, endocrine disorder, anemia, environmental exposure, polypharmacy . This is not  an exhaustive differential.   Past Medical History / Co-morbidities / Social History: Hypertension, hyperlipidemia, patient endorses that she drinks 64 ounces of water per day  Additional history: Chart reviewed. Pertinent results include: Reviewed lab work, imaging from previous emergency department visits, outpatient cardiology visits, echo from 2021 shows left ventricular ejection fraction 60 to 65%  Physical Exam: Physical exam performed. The pertinent findings include: Patient with no focal neurologic deficits on my exam, she did have a slightly elevated blood pressure at 162/77, notably she can ambulate without difficulty, and does not endorse any ongoing dizziness  Lab Tests: I ordered, and personally interpreted labs.  The pertinent results include: Patient with very mild hyponatremia, sodium 134, possible that she may have some contribution of hyponatremia to her dizziness, encouraged less free water at home, increase sodium intake within reason, her lab work is otherwise unremarkable, no leukocytosis   Imaging Studies: I ordered imaging studies including CT head without contrast. I independently visualized and interpreted imaging which showed  patient has some mastoid effusion, but no tenderness over the mastoid bone, no ear pain, ear drainage, or acute otitis media, low clinical suspicion for mastoiditis, is likely an incidental finding.. I agree with the radiologist interpretation.   Cardiac Monitoring:  The patient was maintained on a cardiac monitor.  My attending physician Dr. Johnney Killian viewed and interpreted the cardiac monitored which showed an underlying rhythm of: Normal sinus rhythm, no significant change from baseline. I agree with this interpretation.   Disposition: After consideration of the diagnostic results and the patients response to treatment, I feel that patient with improvement of blood pressure despite no treatment, her lab work and imaging today are reassuring, discussed that very mild hyponatremia may be contributing to her dizziness, lightheadedness, versus mild orthostatic hypotension, versus other, I do not see any clinical explanation for her symptoms today, considered occult basilar stroke missed on CT, however given she is not ataxic, and with normal gait on my evaluation I do not think transfer for MRI is warranted in this patient who is outside of any stroke window.   emergency department workup does not suggest an emergent condition requiring admission or immediate intervention beyond what has been performed at this time. The plan is: Decreased free water intake, possible mild increase of sodium intake, close PCP follow-up. The patient is safe for discharge and has been instructed to return immediately for worsening symptoms, change in symptoms or any other concerns.  I discussed this case with my attending physician Dr. Johnney Killian who cosigned this note including patient's presenting symptoms, physical exam, and planned diagnostics and interventions. Attending physician stated agreement with plan or made changes to plan which were implemented.    Attending physician assessed the patient at bedside.  Final  Clinical Impression(s) / ED Diagnoses Final diagnoses:  Dizziness  Hypertension, unspecified type    Rx / DC Orders ED Discharge Orders     None         Dorien Chihuahua 01/16/23 1810    Charlesetta Shanks, MD 01/17/23 [phone]

## 2023-01-16 NOTE — Discharge Instructions (Signed)
As we discussed, I recommend that you decrease the amount of fluids that you are drinking, and add a-of salt to your diet, please follow-up with your primary care doctor and continue to monitor your blood pressure.

## 2023-01-26 DIAGNOSIS — M1711 Unilateral primary osteoarthritis, right knee: Secondary | ICD-10-CM | POA: Diagnosis not present

## 2023-03-10 DIAGNOSIS — H43399 Other vitreous opacities, unspecified eye: Secondary | ICD-10-CM | POA: Diagnosis not present

## 2023-03-10 DIAGNOSIS — R0609 Other forms of dyspnea: Secondary | ICD-10-CM | POA: Diagnosis not present

## 2023-03-10 DIAGNOSIS — R531 Weakness: Secondary | ICD-10-CM | POA: Diagnosis not present

## 2023-03-10 DIAGNOSIS — R519 Headache, unspecified: Secondary | ICD-10-CM | POA: Diagnosis not present

## 2023-04-07 ENCOUNTER — Other Ambulatory Visit: Payer: Self-pay | Admitting: Family Medicine

## 2023-04-07 DIAGNOSIS — Z0000 Encounter for general adult medical examination without abnormal findings: Secondary | ICD-10-CM

## 2023-04-11 ENCOUNTER — Ambulatory Visit
Admission: RE | Admit: 2023-04-11 | Discharge: 2023-04-11 | Disposition: A | Discharge status: Home / Self Care | Payer: Medicare Other | Source: Ambulatory Visit | Attending: Family Medicine | Admitting: Family Medicine

## 2023-04-11 DIAGNOSIS — Z0000 Encounter for general adult medical examination without abnormal findings: Secondary | ICD-10-CM

## 2023-04-11 DIAGNOSIS — Z1231 Encounter for screening mammogram for malignant neoplasm of breast: Secondary | ICD-10-CM | POA: Diagnosis not present

## 2023-04-14 ENCOUNTER — Other Ambulatory Visit: Payer: Self-pay | Admitting: Family Medicine

## 2023-04-14 DIAGNOSIS — R928 Other abnormal and inconclusive findings on diagnostic imaging of breast: Secondary | ICD-10-CM

## 2023-04-25 ENCOUNTER — Ambulatory Visit
Admission: RE | Admit: 2023-04-25 | Discharge: 2023-04-25 | Disposition: A | Discharge status: Home / Self Care | Payer: Medicare Other | Source: Ambulatory Visit | Attending: Family Medicine | Admitting: Family Medicine

## 2023-04-25 ENCOUNTER — Other Ambulatory Visit: Payer: Self-pay | Admitting: Family Medicine

## 2023-04-25 DIAGNOSIS — R928 Other abnormal and inconclusive findings on diagnostic imaging of breast: Secondary | ICD-10-CM

## 2023-04-25 DIAGNOSIS — R921 Mammographic calcification found on diagnostic imaging of breast: Secondary | ICD-10-CM | POA: Diagnosis not present

## 2023-05-03 ENCOUNTER — Ambulatory Visit
Admission: RE | Admit: 2023-05-03 | Discharge: 2023-05-03 | Disposition: A | Discharge status: Home / Self Care | Payer: Medicare Other | Source: Ambulatory Visit | Attending: Family Medicine | Admitting: Family Medicine

## 2023-05-03 DIAGNOSIS — R928 Other abnormal and inconclusive findings on diagnostic imaging of breast: Secondary | ICD-10-CM

## 2023-05-03 DIAGNOSIS — R921 Mammographic calcification found on diagnostic imaging of breast: Secondary | ICD-10-CM | POA: Diagnosis not present

## 2023-05-03 DIAGNOSIS — D0512 Intraductal carcinoma in situ of left breast: Secondary | ICD-10-CM | POA: Diagnosis not present

## 2023-05-03 HISTORY — PX: BREAST BIOPSY: SHX20

## 2023-05-12 ENCOUNTER — Telehealth: Payer: Self-pay | Admitting: Radiation Oncology

## 2023-05-12 ENCOUNTER — Other Ambulatory Visit: Payer: Self-pay | Admitting: Surgery

## 2023-05-12 DIAGNOSIS — D0512 Intraductal carcinoma in situ of left breast: Secondary | ICD-10-CM

## 2023-05-12 NOTE — Telephone Encounter (Signed)
7/12 @ 2:28 pm Left voicemail for patient to call our office to be schedule for consult.

## 2023-05-17 ENCOUNTER — Other Ambulatory Visit: Payer: Self-pay | Admitting: Surgery

## 2023-05-17 DIAGNOSIS — D0512 Intraductal carcinoma in situ of left breast: Secondary | ICD-10-CM

## 2023-05-19 ENCOUNTER — Telehealth: Payer: Self-pay | Admitting: Radiation Oncology

## 2023-05-19 ENCOUNTER — Ambulatory Visit: Payer: Medicare Other

## 2023-05-19 ENCOUNTER — Ambulatory Visit: Payer: Medicare Other | Admitting: Radiation Oncology

## 2023-05-19 NOTE — Telephone Encounter (Signed)
7/19 @ 10:12 am called patient to reschedule to next Friday, due to computers down -per Bryan Lemma.  Patient already at [facility] to check in.  Patient stated would like to go home to look at her schedule and call us back.  Waiting on call back to reschedule patient.

## 2023-05-24 ENCOUNTER — Inpatient Hospital Stay: Payer: Medicare Other

## 2023-05-24 ENCOUNTER — Other Ambulatory Visit: Payer: Self-pay

## 2023-05-24 ENCOUNTER — Inpatient Hospital Stay: Payer: Medicare Other | Attending: Hematology and Oncology | Admitting: Hematology and Oncology

## 2023-05-24 VITALS — BP 143/84 | HR 72 | Temp 98.3°F | Resp 15 | Ht 63.0 in | Wt 195.8 lb

## 2023-05-24 DIAGNOSIS — Z170 Estrogen receptor positive status [ER+]: Secondary | ICD-10-CM | POA: Insufficient documentation

## 2023-05-24 DIAGNOSIS — E785 Hyperlipidemia, unspecified: Secondary | ICD-10-CM | POA: Insufficient documentation

## 2023-05-24 DIAGNOSIS — D0512 Intraductal carcinoma in situ of left breast: Secondary | ICD-10-CM | POA: Diagnosis not present

## 2023-05-24 DIAGNOSIS — C50919 Malignant neoplasm of unspecified site of unspecified female breast: Secondary | ICD-10-CM | POA: Insufficient documentation

## 2023-05-24 DIAGNOSIS — D0510 Intraductal carcinoma in situ of unspecified breast: Secondary | ICD-10-CM | POA: Insufficient documentation

## 2023-05-24 NOTE — Progress Notes (Signed)
[facility] CONSULT NOTE  Patient Care Team: Blair Heys, MD as PCP - General (Family Medicine) Lars Masson, MD as PCP - Cardiology (Cardiology)  CHIEF COMPLAINTS/PURPOSE OF CONSULTATION:  Newly diagnosed breast cancer  HISTORY OF PRESENTING ILLNESS:  Carmen Fischer 82 y.o. female is here because of recent diagnosis of left breast DCIS  I reviewed her records extensively and collaborated the history with the patient.  SUMMARY OF ONCOLOGIC HISTORY: Oncology History  DCIS (ductal carcinoma in situ) of breast  04/11/2023 Mammogram   Bilateral screening mammogram showed calcifications in the left breast warranting further evaluation.  Diagnostic mammogram confirmed left breast calcifications   05/03/2023 Pathology Results   Left breast biopsy showed DCIS, high-grade, solid and cribriform types, necrosis and calcs present, ER 80% positive weak staining, PR 0% negative   05/24/2023 Initial Diagnosis   DCIS (ductal carcinoma in situ) of breast    Patient arrived to the appointment today by herself.  At baseline she has dyslipidemia and high blood pressure, takes all her medications as prescribed.  She is otherwise very independent, lives by herself, has a son who lives in [place] and her daughter in [place].  She is a retired Psychologist, forensic for [employer].  She denies any other major complaints today.  She is scheduled for surgery in August.  She is to meet the radiation oncologist this Friday.  Rest of the pertinent 10 point ROS reviewed and negative  MEDICAL HISTORY:  Past Medical History:  Diagnosis Date   Arthritis    Glaucoma    High cholesterol    High cholesterol    Hypertension     SURGICAL HISTORY: Past Surgical History:  Procedure Laterality Date   ABDOMINAL HYSTERECTOMY     BREAST BIOPSY Left 05/03/2023   MM LT BREAST BX W LOC DEV 1ST LESION IMAGE BX SPEC STEREO GUIDE 05/03/2023 [facility] MAMMOGRAPHY   CHOLECYSTECTOMY     LEFT HEART CATH AND  CORONARY ANGIOGRAPHY N/A 03/10/2020   Procedure: LEFT HEART CATH AND CORONARY ANGIOGRAPHY;  Surgeon: [place], Peter M, MD;  Location: [facility] INVASIVE CV LAB;  Service: Cardiovascular;  Laterality: N/A;    SOCIAL HISTORY: Social History   Socioeconomic History   Marital status: Married    Spouse name: Not on file   Number of children: Not on file   Years of education: Not on file   Highest education level: Not on file  Occupational History   Not on file  Tobacco Use   Smoking status: Never   Smokeless tobacco: Never  Substance and Sexual Activity   Alcohol use: Yes    Comment: rarely   Drug use: Never   Sexual activity: Not on file  Other Topics Concern   Not on file  Social History Narrative   Not on file   Social Determinants of Health   Financial Resource Strain: Not on file  Food Insecurity: Not on file  Transportation Needs: Not on file  Physical Activity: Not on file  Stress: Not on file  Social Connections: Not on file  Intimate Partner Violence: Not on file    FAMILY HISTORY: Family History  Problem Relation Age of Onset   Heart attack Mother    Heart attack Father     ALLERGIES:  is allergic to shellfish allergy and prednisone.  MEDICATIONS:  Current Outpatient Medications  Medication Sig Dispense Refill   aspirin EC 81 MG tablet Take 81 mg by mouth daily.     cholecalciferol (VITAMIN D3)  25 MCG (1000 UNIT) tablet daily.     cycloSPORINE (RESTASIS) 0.05 % ophthalmic emulsion Place 1 drop into both eyes 2 (two) times daily.     desloratadine (CLARINEX) 5 MG tablet Take 5 mg by mouth daily.     hydrochlorothiazide (HYDRODIURIL) 25 MG tablet Take 25 mg by mouth daily.     KLOR-CON M20 20 MEQ tablet daily.     Multiple Vitamin (MULTIVITAMIN WITH MINERALS) TABS tablet Take 1 tablet by mouth daily.     omeprazole (PRILOSEC) 20 MG capsule Take 20 mg by mouth daily.     OVER THE COUNTER MEDICATION Take 1 tablet by mouth daily. Hydroeye Vitamin     polyethylene  glycol (MIRALAX / GLYCOLAX) 17 g packet Take 17 g by mouth daily as needed for moderate constipation.      simvastatin (ZOCOR) 20 MG tablet Take 20 mg by mouth every evening.      spironolactone (ALDACTONE) 25 MG tablet Take 0.5 tablets (12.5 mg total) by mouth daily. 45 tablet 3   Travoprost, BAK Free, (TRAVATAN) 0.004 % SOLN ophthalmic solution Place 1 drop into both eyes at bedtime.     vitamin E 180 MG (400 UNITS) capsule daily.     No current facility-administered medications for this visit.    REVIEW OF SYSTEMS:   Constitutional: Denies fevers, chills or abnormal night sweats Eyes: Denies blurriness of vision, double vision or watery eyes Ears, nose, mouth, throat, and face: Denies mucositis or sore throat Respiratory: Denies cough, dyspnea or wheezes Cardiovascular: Denies palpitation, chest discomfort or lower extremity swelling Gastrointestinal:  Denies nausea, heartburn or change in bowel habits Skin: Denies abnormal skin rashes Lymphatics: Denies new lymphadenopathy or easy bruising Neurological:Denies numbness, tingling or new weaknesses Behavioral/Psych: Mood is stable, no new changes  Breast: Denies any palpable lumps or discharge All other systems were reviewed with the patient and are negative.  PHYSICAL EXAMINATION: ECOG PERFORMANCE STATUS: 0 - Asymptomatic  Vitals:   05/24/23 1039  BP: (!) 143/84  Pulse: 72  Resp: 15  Temp: 98.3 F (36.8 C)  SpO2: 97%   Filed Weights   05/24/23 1039  Weight: 195 lb 12.8 oz (88.8 kg)    GENERAL:alert, no distress and comfortable SKIN: skin color, texture, turgor are normal, no rashes or significant lesions EYES: normal, conjunctiva are pink and non-injected, sclera clear OROPHARYNX:no exudate, no erythema and lips, buccal mucosa, and tongue normal  NECK: supple, thyroid normal size, non-tender, without nodularity LYMPH:  no palpable lymphadenopathy in the cervical, axillary  LUNGS: clear to auscultation and percussion  with normal breathing effort HEART: regular rate & rhythm and no murmurs and no lower extremity edema ABDOMEN:abdomen soft, non-tender and normal bowel sounds Musculoskeletal:no cyanosis of digits and no clubbing  PSYCH: alert & oriented x 3 with fluent speech NEURO: no focal motor/sensory deficit  LABORATORY DATA:  I have reviewed the data as listed Lab Results  Component Value Date   WBC 5.6 01/16/2023   HGB 12.9 01/16/2023   HCT 39.4 01/16/2023   MCV 84.0 01/16/2023   PLT 243 01/16/2023   Lab Results  Component Value Date   NA 134 (L) 01/16/2023   K 3.8 01/16/2023   CL 98 01/16/2023   CO2 29 01/16/2023    RADIOGRAPHIC STUDIES: I have personally reviewed the radiological reports and agreed with the findings in the report.  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  We have discussed the following details about DCIS today.  Pathology review: I discussed with the patient the difference between DCIS and invasive breast cancer. It is considered a precancerous lesion. DCIS is classified as a Stage 0 breast cancer. It is generally detected through mammograms as calcifications. We discussed the significance of grades and its impact on prognosis. We also discussed the importance of ER and PR receptors and their implications to adjuvant treatment options. Prognosis of DCIS dependence on grade and degree of comedo necrosis. It is anticipated that if not treated, 20-30% of DCIS can develop into invasive breast cancer.  Recommendation: 1. Breast conserving surgery 2. Followed by adjuvant radiation therapy 3. Followed by antiestrogen therapy with tamoxifen/aromatase inhibitors based on menopausal status 5 years if repeat prognostics still show ER positivity  Tamoxifen counseling: We  discussed the risks and benefits of tamoxifen. These include but not limited to insomnia, hot flashes, mood changes, vaginal dryness, and weight gain. Although rare, serious side effects including endometrial cancer, risk of blood clots were also discussed. We strongly believe that the benefits far outweigh the risks. Patient understands these risks and consented to starting treatment. Planned treatment duration is 5 years.  Aromatase inhibitors counseling: We have discussed the mechanism of action of aromatase inhibitors today.  We have discussed adverse effects including but not limited to menopausal symptoms, increased risk of osteoporosis and fractures, cardiovascular events, arthralgias and myalgias.  We do believe that the benefits far outweigh the risks.  Plan treatment duration of 5 years.  She is agreeable with this plan.  She will return to clinic after surgery to review final pathology and any additional recommendations.   All questions were answered. The patient knows to call the clinic with any problems, questions or concerns.    Rachel Moulds, MD 05/24/23

## 2023-05-24 NOTE — Assessment & Plan Note (Signed)
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  We have discussed the following details about DCIS today.  Pathology review: I discussed with the patient the difference between DCIS and invasive breast cancer. It is considered a precancerous lesion. DCIS is classified as a Stage 0 breast cancer. It is generally detected through mammograms as calcifications. We discussed the significance of grades and its impact on prognosis. We also discussed the importance of ER and PR receptors and their implications to adjuvant treatment options. Prognosis of DCIS dependence on grade and degree of comedo necrosis. It is anticipated that if not treated, 20-30% of DCIS can develop into invasive breast cancer.  Recommendation: 1. Breast conserving surgery 2. Followed by adjuvant radiation therapy 3. Followed by antiestrogen therapy with tamoxifen/aromatase inhibitors based on menopausal status 5 years if repeat prognostics still show ER positivity  Tamoxifen counseling: We discussed the risks and benefits of tamoxifen. These include but not limited to insomnia, hot flashes, mood changes, vaginal dryness, and weight gain. Although rare, serious side effects including endometrial cancer, risk of blood clots were also discussed. We strongly believe that the benefits far outweigh the risks. Patient understands these risks and consented to starting treatment. Planned treatment duration is 5 years.  Aromatase inhibitors counseling: We have discussed the mechanism of action of aromatase inhibitors today.  We have discussed adverse effects including but not limited to menopausal symptoms, increased risk of osteoporosis and fractures, cardiovascular events, arthralgias and myalgias.  We do believe that the benefits far outweigh the risks.  Plan treatment duration of 5 years.  She is agreeable  with this plan.  She will return to clinic after surgery to review final pathology and any additional recommendations.

## 2023-05-25 ENCOUNTER — Emergency Department (HOSPITAL_BASED_OUTPATIENT_CLINIC_OR_DEPARTMENT_OTHER): Payer: Medicare Other

## 2023-05-25 ENCOUNTER — Encounter: Payer: Self-pay | Admitting: *Deleted

## 2023-05-25 ENCOUNTER — Encounter (HOSPITAL_BASED_OUTPATIENT_CLINIC_OR_DEPARTMENT_OTHER): Payer: Self-pay | Admitting: Emergency Medicine

## 2023-05-25 ENCOUNTER — Emergency Department (HOSPITAL_BASED_OUTPATIENT_CLINIC_OR_DEPARTMENT_OTHER)
Admission: EM | Admit: 2023-05-25 | Discharge: 2023-05-25 | Disposition: A | Discharge status: Home / Self Care | Payer: Medicare Other | Attending: Emergency Medicine | Admitting: Emergency Medicine

## 2023-05-25 ENCOUNTER — Emergency Department (HOSPITAL_BASED_OUTPATIENT_CLINIC_OR_DEPARTMENT_OTHER): Payer: Medicare Other | Admitting: Radiology

## 2023-05-25 ENCOUNTER — Telehealth: Payer: Self-pay | Admitting: Radiation Oncology

## 2023-05-25 ENCOUNTER — Other Ambulatory Visit: Payer: Self-pay

## 2023-05-25 DIAGNOSIS — Z79899 Other long term (current) drug therapy: Secondary | ICD-10-CM | POA: Insufficient documentation

## 2023-05-25 DIAGNOSIS — R42 Dizziness and giddiness: Secondary | ICD-10-CM | POA: Diagnosis not present

## 2023-05-25 DIAGNOSIS — I10 Essential (primary) hypertension: Secondary | ICD-10-CM | POA: Insufficient documentation

## 2023-05-25 DIAGNOSIS — R079 Chest pain, unspecified: Secondary | ICD-10-CM | POA: Insufficient documentation

## 2023-05-25 DIAGNOSIS — Z7982 Long term (current) use of aspirin: Secondary | ICD-10-CM | POA: Insufficient documentation

## 2023-05-25 DIAGNOSIS — R270 Ataxia, unspecified: Secondary | ICD-10-CM | POA: Diagnosis not present

## 2023-05-25 HISTORY — DX: Malignant neoplasm of unspecified site of unspecified female breast: C50.919

## 2023-05-25 LAB — COMPREHENSIVE METABOLIC PANEL
ALT: 18 U/L (ref 0–44)
AST: 26 U/L (ref 15–41)
Albumin: 4.4 g/dL (ref 3.5–5.0)
Alkaline Phosphatase: 59 U/L (ref 38–126)
Anion gap: 10 (ref 5–15)
BUN: 12 mg/dL (ref 8–23)
CO2: 28 mmol/L (ref 22–32)
Calcium: 10.4 mg/dL — ABNORMAL HIGH (ref 8.9–10.3)
Chloride: 97 mmol/L — ABNORMAL LOW (ref 98–111)
Creatinine, Ser: 0.9 mg/dL (ref 0.44–1.00)
GFR, Estimated: >60 mL/min (ref >60)
Glucose, Bld: 90 mg/dL (ref 70–99)
Potassium: 3.7 mmol/L (ref 3.5–5.1)
Sodium: 135 mmol/L (ref 135–145)
Total Bilirubin: 0.6 mg/dL (ref 0.3–1.2)
Total Protein: 7.6 g/dL (ref 6.5–8.1)

## 2023-05-25 LAB — PROTIME-INR
INR: 1 (ref 0.8–1.2)
Prothrombin Time: 13.5 seconds (ref 11.4–15.2)

## 2023-05-25 LAB — CBG MONITORING, ED: Glucose-Capillary: 95 mg/dL (ref 70–99)

## 2023-05-25 LAB — DIFFERENTIAL
Abs Immature Granulocytes: 0.01 10*3/uL (ref 0.00–0.07)
Basophils Absolute: 0 10*3/uL (ref 0.0–0.1)
Basophils Relative: 0 %
Eosinophils Absolute: 0.1 10*3/uL (ref 0.0–0.5)
Eosinophils Relative: 1 %
Immature Granulocytes: 0 %
Lymphocytes Relative: 25 %
Lymphs Abs: 1.2 10*3/uL (ref 0.7–4.0)
Monocytes Absolute: 0.4 10*3/uL (ref 0.1–1.0)
Monocytes Relative: 9 %
Neutro Abs: 3.3 10*3/uL (ref 1.7–7.7)
Neutrophils Relative %: 65 %

## 2023-05-25 LAB — APTT: aPTT: 30 seconds (ref 24–36)

## 2023-05-25 LAB — CBC
HCT: 38.9 % (ref 36.0–46.0)
Hemoglobin: 12.9 g/dL (ref 12.0–15.0)
MCH: 27.7 pg (ref 26.0–34.0)
MCHC: 33.2 g/dL (ref 30.0–36.0)
MCV: 83.5 fL (ref 80.0–100.0)
Platelets: 255 10*3/uL (ref 150–400)
RBC: 4.66 MIL/uL (ref 3.87–5.11)
RDW: 14.7 % (ref 11.5–15.5)
WBC: 5.1 10*3/uL (ref 4.0–10.5)
nRBC: 0 % (ref 0.0–0.2)

## 2023-05-25 LAB — TROPONIN I (HIGH SENSITIVITY): Troponin I (High Sensitivity): 3 ng/L (ref <18)

## 2023-05-25 MED ORDER — SODIUM CHLORIDE 0.9% FLUSH
3.0000 mL | Freq: Once | INTRAVENOUS | Status: AC
  Administered 2023-05-25: 3 mL via INTRAVENOUS

## 2023-05-25 NOTE — ED Notes (Addendum)
PT HAS COMPLAINTS OF ELEVATED BLOOD PRESSURE ONSET TODAY.  PT HX OF HTN

## 2023-05-25 NOTE — ED Provider Notes (Signed)
[place] EMERGENCY DEPARTMENT AT [facility] Provider Note   CSN: [id number] Arrival date & time: 05/25/23  1528     History  Chief Complaint  Patient presents with   Gait Problem   Hypertension    Carmen Fischer is a 82 y.o. female.  Patient is an 82 year old female with a past medical history of hypertension, hyperlipidemia and recently diagnosed breast cancer not yet started on therapy presenting to the emergency department with chest pain and dizziness.  The patient states that around 11 AM this morning she started to feel a heartburn type of chest pain.  She states that she took an aspirin and a Tums without significant improvement.  She states that she had associated feeling of being off balance.  She denied any headaches, numbness or weakness, nausea or vomiting.  She denies any associated shortness of breath.  She states that around 1:00 she checked her blood pressure and noticed that it was significantly elevated to the 170s despite taking her blood pressure medication today which prompted her to come to the emergency department to be evaluated.  The history is provided by the patient.  Hypertension       Home Medications Prior to Admission medications   Medication Sig Start Date End Date Taking? Authorizing Provider  aspirin EC 81 MG tablet Take 81 mg by mouth daily.    [provider]  cholecalciferol (VITAMIN D3) 25 MCG (1000 UNIT) tablet daily.    [provider]  cycloSPORINE (RESTASIS) 0.05 % ophthalmic emulsion Place 1 drop into both eyes 2 (two) times daily.    [provider]  desloratadine (CLARINEX) 5 MG tablet Take 5 mg by mouth daily. 02/13/20   [provider]  hydrochlorothiazide (HYDRODIURIL) 25 MG tablet Take 25 mg by mouth daily.    [provider]  KLOR-CON M20 20 MEQ tablet daily. 02/14/20   [provider]  Multiple Vitamin (MULTIVITAMIN WITH MINERALS) TABS tablet Take 1 tablet by mouth  daily.    [provider]  omeprazole (PRILOSEC) 20 MG capsule Take 20 mg by mouth daily.    [provider]  OVER THE COUNTER MEDICATION Take 1 tablet by mouth daily. Hydroeye Vitamin    [provider]  polyethylene glycol (MIRALAX / GLYCOLAX) 17 g packet Take 17 g by mouth daily as needed for moderate constipation.     [provider]  simvastatin (ZOCOR) 20 MG tablet Take 20 mg by mouth every evening.     [provider]  spironolactone (ALDACTONE) 25 MG tablet Take 0.5 tablets (12.5 mg total) by mouth daily. 03/04/20   Lars Masson, MD  Travoprost, BAK Free, (TRAVATAN) 0.004 % SOLN ophthalmic solution Place 1 drop into both eyes at bedtime.    [provider]  vitamin E 180 MG (400 UNITS) capsule daily.    [provider]      Allergies    Shellfish allergy and Prednisone    Review of Systems   Review of Systems  Physical Exam Updated Vital Signs BP (!) 136/90 (BP Location: Right Arm)   Pulse 66   Temp 98 F (36.7 C) (Oral)   Resp 14   SpO2 98%  Physical Exam Vitals and nursing note reviewed.  Constitutional:      General: She is not in acute distress.    Appearance: Normal appearance.  HENT:     Head: Normocephalic and atraumatic.     Nose: Nose normal.     Mouth/Throat:  Mouth: Mucous membranes are moist.     Pharynx: Oropharynx is clear.  Eyes:     Extraocular Movements: Extraocular movements intact.     Conjunctiva/sclera: Conjunctivae normal.     Pupils: Pupils are equal, round, and reactive to light.     Comments: No nystagmus  Cardiovascular:     Rate and Rhythm: Normal rate and regular rhythm.     Heart sounds: Normal heart sounds.  Pulmonary:     Effort: Pulmonary effort is normal.     Breath sounds: Normal breath sounds.  Chest:     Chest wall: No tenderness.  Abdominal:     General: Abdomen is flat.     Palpations: Abdomen is soft.     Tenderness: There is no abdominal tenderness.   Musculoskeletal:        General: Normal range of motion.     Cervical back: Normal range of motion and neck supple.  Skin:    General: Skin is warm and dry.  Neurological:     General: No focal deficit present.     Mental Status: She is alert and oriented to person, place, and time.     Cranial Nerves: No cranial nerve deficit.     Sensory: No sensory deficit.     Motor: No weakness.     Coordination: Coordination normal.  Psychiatric:        Mood and Affect: Mood normal.        Behavior: Behavior normal.     ED Results / Procedures / Treatments   Labs (all labs ordered are listed, but only abnormal results are displayed) Labs Reviewed  COMPREHENSIVE METABOLIC PANEL - Abnormal; Notable for the following components:      Result Value   Chloride 97 (*)    Calcium 10.4 (*)    All other components within normal limits  PROTIME-INR  APTT  CBC  DIFFERENTIAL  CBG MONITORING, ED  TROPONIN I (HIGH SENSITIVITY)    EKG EKG Interpretation Date/Time:  Thursday May 25 2023 15:37:34 EDT Ventricular Rate:  61 PR Interval:  131 QRS Duration:  87 QT Interval:  442 QTC Calculation: 446 R Axis:   -40  Text Interpretation: Sinus rhythm Left axis deviation Abnormal R-wave progression, early transition T-wave inversions in V2 now upright, otherwise no significant change was found Confirmed by Elayne Snare ([id number]) on 05/25/2023 3:41:04 PM  Radiology CT HEAD WO CONTRAST  Result Date: 05/25/2023 CLINICAL DATA:  Ataxia EXAM: CT HEAD WITHOUT CONTRAST TECHNIQUE: Contiguous axial images were obtained from the base of the skull through the vertex without intravenous contrast. RADIATION DOSE REDUCTION: This exam was performed according to the departmental dose-optimization program which includes automated exposure control, adjustment of the mA and/or kV according to patient size and/or use of iterative reconstruction technique. COMPARISON:  CT 01/16/2023, 07/31/2022 brain MRI 02/08/2020  FINDINGS: Brain: No acute intracranial hemorrhage. No focal mass lesion. No CT evidence of acute infarction. No midline shift or mass effect. No hydrocephalus. Basilar cisterns are patent. Prominent large RIGHT triangular sulcus again noted. Vascular: No hyperdense vessel or unexpected calcification. Skull: Normal. Negative for fracture or focal lesion. Sinuses/Orbits: Paranasal sinuses and mastoid air cells are clear. Orbits are clear. Other: None. IMPRESSION: 1. No acute intracranial findings. 2. Unchanged from prior Electronically Signed   By: Genevive Bi M.D.   On: 05/25/2023 17:00   DG Chest 2 View  Result Date: 05/25/2023 CLINICAL DATA:  Chest pain. EXAM: CHEST - 2 VIEW COMPARISON:  July 31, 2022.  FINDINGS: The heart size and mediastinal contours are within normal limits. Left lung is clear. Stable minimal right basilar scarring is noted. The visualized skeletal structures are unremarkable. IMPRESSION: No active cardiopulmonary disease. Electronically Signed   By: Lupita Raider M.D.   On: 05/25/2023 16:46    Procedures Procedures    Medications Ordered in ED Medications  sodium chloride flush (NS) 0.9 % injection 3 mL (3 mLs Intravenous Given 05/25/23 1600)    ED Course/ Medical Decision Making/ A&P Clinical Course as of 05/25/23 1748  Thu May 25, 2023  1710 Initial troponin negative, symptoms have been ongoing since 11 AM so single troponin is sufficient. Remainder of labs within normal range, no acute abnormality on CTH or CXR. [VK]  1745 Patient is able to ambulate steadily in the room, Romberg negative, symptoms are all resolved.  She is stable for discharge home with outpatient follow-up and was given strict return precautions. [VK]    Clinical Course User Index [VK] Rexford Maus, DO                             Medical Decision Making This patient presents to the ED with chief complaint(s) of chest pain, dizziness with pertinent past medical history of HTN, HLD,  new breast cancer which further complicates the presenting complaint. The complaint involves an extensive differential diagnosis and also carries with it a high risk of complications and morbidity.    The differential diagnosis includes ACS, arrhythmia, anemia, gastritis, GERD, pneumonia, pneumothorax, pulmonary edema, pleural effusion, dehydration, electrolyte abnormality, considering CVA/TIA though less likely as she has no focal neurologic deficits, hypo or hyperglycemia  Additional history obtained: Additional history obtained from N/A Records reviewed oncology records  ED Course and Reassessment: On patient's arrival to the emergency department she is hemodynamically stable in no acute distress without focal neurologic deficits.  She still having some active mild chest pain.  EKG performed on arrival showed normal sinus rhythm with prior T wave inversion in V2 now upright otherwise no significant change.  Patient will have labs including troponin, chest x-ray and head CT to evaluate for cause of her symptoms.  She declined any pain medication at this time and will be closely reassessed.  Independent labs interpretation:  The following labs were independently interpreted: within normal range  Independent visualization of imaging: - I independently visualized the following imaging with scope of interpretation limited to determining acute life threatening conditions related to emergency care: CTH, CXR, which revealed no acute disease  Consultation: - Consulted or discussed management/test interpretation w/ external professional: N/A  Consideration for admission or further workup: Patient has no emergent conditions requiring admission or further work-up at this time and is stable for discharge home with primary care follow-up  Social Determinants of health: N/A    Amount and/or Complexity of Data Reviewed Labs: ordered. Radiology: ordered.          Final Clinical Impression(s)  / ED Diagnoses Final diagnoses:  Dizziness  Nonspecific chest pain    Rx / DC Orders ED Discharge Orders     None         Rexford Maus, DO 05/25/23 1748

## 2023-05-25 NOTE — Discharge Instructions (Signed)
You were seen in the emergency department for your dizziness and chest pain.  Your workup showed no signs of heart attack or stress on your heart, no signs of stroke, dehydration or anemia.  It is unclear what is causing your symptoms at this time you can follow-up with your primary doctor to have your symptoms and your blood pressure rechecked.  You should return to the emergency department if you are having significantly worsening chest pain, severe shortness of breath, you are unable to walk, you pass out, you have numbness or weakness in one-sided body compared to the other or if you have any other new or concerning symptoms.

## 2023-05-25 NOTE — Telephone Encounter (Signed)
7/25 @ 2:50 pm received call from patient stating need to reschedule her appointment from tomorrow due to not feeling well and heading to emergency room, but going to [facility]gent [facility] on [address] instead.  Patient has been rescheduled to 8/9 with Dr. Mitzi Hansen.  Email sent to [facility] Lemma and copied Rober Minion and Dr. Mitzi Hansen, so they are aware.

## 2023-05-25 NOTE — ED Triage Notes (Addendum)
Pt presents to ED POV. Pt c/o HTN at home today and feeling "off balance." Since 1200 today. Pt reports feeling like her symptoms are getting a little better now. Pt ambulated to room without difficulty. Bp 179/95 at home

## 2023-05-26 ENCOUNTER — Ambulatory Visit: Payer: Medicare Other | Admitting: Radiation Oncology

## 2023-05-26 ENCOUNTER — Ambulatory Visit: Payer: Medicare Other

## 2023-05-26 DIAGNOSIS — Z6835 Body mass index (BMI) 35.0-35.9, adult: Secondary | ICD-10-CM | POA: Diagnosis not present

## 2023-05-26 DIAGNOSIS — D0510 Intraductal carcinoma in situ of unspecified breast: Secondary | ICD-10-CM | POA: Diagnosis not present

## 2023-05-26 DIAGNOSIS — I10 Essential (primary) hypertension: Secondary | ICD-10-CM | POA: Diagnosis not present

## 2023-06-01 ENCOUNTER — Ambulatory Visit: Payer: Medicare Other | Admitting: Internal Medicine

## 2023-06-01 ENCOUNTER — Encounter: Payer: Self-pay | Admitting: Internal Medicine

## 2023-06-01 VITALS — BP 130/60 | HR 76 | Ht 63.0 in | Wt 193.0 lb

## 2023-06-01 DIAGNOSIS — Z01818 Encounter for other preprocedural examination: Secondary | ICD-10-CM | POA: Diagnosis not present

## 2023-06-01 DIAGNOSIS — E782 Mixed hyperlipidemia: Secondary | ICD-10-CM | POA: Diagnosis not present

## 2023-06-01 DIAGNOSIS — I10 Essential (primary) hypertension: Secondary | ICD-10-CM | POA: Diagnosis not present

## 2023-06-01 NOTE — Progress Notes (Signed)
Cardiology Office Note:    Date:  06/01/2023   ID:  Carmen Fischer, DOB 20-Sep-1941, MRN [id number]  PCP:  Blair Heys, MD   [facility] Providers Cardiologist:  Tobias Alexander, MD     Referring MD: Blair Heys, MD   CC:  ED visit for hypertensive urgency   History of Present Illness:    Carmen Fischer is a 82 y.o. female with a hx of HTN, HLD who presents for follow up. 2021: Normal Echo and Cath. 2024: established with me.  Had palpitations normal heart monitor  05/25/23 Had sudden onset dizziness.  She was feeling chest pain.  She was found to have a elevated blood pressure 170.   She is planned to have a lymphectomy. Troponin normal.  Isolated V1 TWI.    Outside of this isolated incident she feels fine No Chest pain, SOB, Palpitations.  BP issues has resolved.  No clear trigger. Walks between 5,000 and 7,000 steps a day, largely at home. She only does steps on church retreat but does not have symptoms.   Past Medical History:  Diagnosis Date   Arthritis    Breast cancer (HCC)    stage 0 per pt   Glaucoma    High cholesterol    High cholesterol    Hypertension     Past Surgical History:  Procedure Laterality Date   ABDOMINAL HYSTERECTOMY     BREAST BIOPSY Left 05/03/2023   MM LT BREAST BX W LOC DEV 1ST LESION IMAGE BX SPEC STEREO GUIDE 05/03/2023 [facility]   CHOLECYSTECTOMY     LEFT HEART CATH AND CORONARY ANGIOGRAPHY N/A 03/10/2020   Procedure: LEFT HEART CATH AND CORONARY ANGIOGRAPHY;  Surgeon: [place], Peter M, MD;  Location: [facility];  Service: Cardiovascular;  Laterality: N/A;    Current Medications: Current Meds  Medication Sig   aspirin EC 81 MG tablet Take 81 mg by mouth daily.   cholecalciferol (VITAMIN D3) 25 MCG (1000 UNIT) tablet daily.   cycloSPORINE (RESTASIS) 0.05 % ophthalmic emulsion Place 1 drop into both eyes 2 (two) times daily.   desloratadine (CLARINEX) 5 MG tablet Take 5 mg by mouth daily.   hydrochlorothiazide  (HYDRODIURIL) 25 MG tablet Take 25 mg by mouth daily.   KLOR-CON M20 20 MEQ tablet daily.   Multiple Vitamin (MULTIVITAMIN WITH MINERALS) TABS tablet Take 1 tablet by mouth daily.   omeprazole (PRILOSEC) 20 MG capsule Take 20 mg by mouth daily.   OVER THE COUNTER MEDICATION Take 1 tablet by mouth daily. Hydroeye Vitamin   polyethylene glycol (MIRALAX / GLYCOLAX) 17 g packet Take 17 g by mouth daily as needed for moderate constipation.    simvastatin (ZOCOR) 20 MG tablet Take 20 mg by mouth every evening.    spironolactone (ALDACTONE) 25 MG tablet Take 0.5 tablets (12.5 mg total) by mouth daily.   Travoprost, BAK Free, (TRAVATAN) 0.004 % SOLN ophthalmic solution Place 1 drop into both eyes at bedtime.   vitamin E 180 MG (400 UNITS) capsule daily.     Allergies:   Shellfish allergy and Prednisone   Social History   Socioeconomic History   Marital status: Married    Spouse name: Not on file   Number of children: Not on file   Years of education: Not on file   Highest education level: Not on file  Occupational History   Not on file  Tobacco Use   Smoking status: Never   Smokeless tobacco: Never  Substance and Sexual Activity  Alcohol use: Yes    Comment: rarely   Drug use: Never   Sexual activity: Not on file  Other Topics Concern   Not on file  Social History Narrative   Not on file   Social Determinants of Health   Financial Resource Strain: Not on file  Food Insecurity: Not on file  Transportation Needs: Not on file  Physical Activity: Not on file  Stress: Not on file  Social Connections: Not on file    Social[employer]es to [employer].; Goes to Mass on Wednesday and "Sunday.  Family History: The patient's family history includes Heart attack in her father and mother.  ROS:   Please see the history of present illness.     EKGs/Labs/Other Studies Reviewed:    The following studies were reviewed today:  Cardiac Studies & Procedures   CARDIAC  CATHETERIZATION  CARDIAC CATHETERIZATION 03/10/2020  Narrative  The left ventricular systolic function is normal.  LV end diastolic pressure is normal.  The left ventricular ejection fraction is 55-65% by visual estimate.  1.  Normal coronary anatomy 2. Normal LV function 3. Normal LVEDP  Plan: consider other causes of chest pain.  Findings Coronary Findings Diagnostic  Dominance: Right  Left Main Vessel was injected. Vessel is normal in caliber. Vessel is angiographically normal.  Left Anterior Descending Vessel was injected. Vessel is normal in caliber. Vessel is angiographically normal.  Ramus Intermedius Vessel was injected. Vessel is normal in caliber. Vessel is angiographically normal.  Left Circumflex Vessel was injected. Vessel is normal in caliber. Vessel is angiographically normal.  Right Coronary Artery Vessel was injected. Vessel is normal in caliber. Vessel is angiographically normal.  Intervention  No interventions have been documented.   STRESS TESTS  NM MYOCAR MULTI W/SPECT W 11/19/2014  Narrative CLINICAL DATA:  82 year old with chest pain, unspecified chest pain type.  EXAM: MYOCARDIAL IMAGING WITH SPECT (REST AND PHARMACOLOGIC-STRESS)  GATED LEFT VENTRICULAR WALL MOTION STUDY  LEFT VENTRICULAR EJECTION FRACTION  TECHNIQUE: Standard myocardial SPECT imaging was performed after resting intravenous injection of 10 mCi Tc-99m sestamibi. Subsequently, intravenous infusion of Lexiscan was performed under the supervision of the Cardiology staff. At peak effect of the drug, 30 mCi Tc-99m sestamibi was injected intravenously and standard myocardial SPECT imaging was performed. Quantitative gated imaging was also performed to evaluate left ventricular wall motion, and estimate left ventricular ejection fraction.  COMPARISON:  12/23/2012  FINDINGS: Perfusion: No decreased activity in the left ventricle on stress imaging to suggest reversible  ischemia or infarction.  Wall Motion: Normal left ventricular wall motion. No left ventricular dilation.  Left Ventricular Ejection Fraction: 80 %  End diastolic volume 54 ml  End systolic volume 11 ml  IMPRESSION: 1. No reversible ischemia or infarction.  2. Normal left ventricular wall motion.  3. Left ventricular ejection fraction is 80%.  4. Low-risk stress test findings*.  *2012 Appropriate Use Criteria for Coronary Revascularization Focused Update: J Am Coll Cardiol. 2012;59(9):857-881. http://content.onlinejacc.org/article.aspx?articleid=1201161   Electronically Signed By: Adam  Henn M.D. On: 11/19/2014 14:50   ECHOCARDIOGRAM  ECHOCARDIOGRAM COMPLETE 03/25/2020  Narrative ECHOCARDIOGRAM REPORT    Patient Name:   Gertrude Hilliker   Date of Exam: 03/25/2020 Medical Rec #:  [id number]     Height:       63.0 in Accession #:    [id number]    Weight:       17" 5.0 lb Date of Birth:  [birth year]     BSA:  1.827 m Patient Age:    79 years      BP:           122/72 mmHg Patient Gender: F             HR:           52 bpm. Exam Location:  [address]  Procedure: 2D Echo, Cardiac Doppler and Color Doppler  Indications:    R07.9* Chest pain, unspecified  History:        Patient has no prior history of Echocardiogram examinations. Risk Factors:Hypertension and Dyslipidemia.  Sonographer:    Cathie Beams RCS Referring Phys: [id number] Faustino Congress NELSON  IMPRESSIONS   1. Left ventricular ejection fraction, by estimation, is 60 to 65%. The left ventricle has normal function. The left ventricle has no regional wall motion abnormalities. There is mild left ventricular hypertrophy. Left ventricular diastolic parameters were normal. 2. Right ventricular systolic function is normal. The right ventricular size is normal. There is normal pulmonary artery systolic pressure. The estimated right ventricular systolic pressure is 29.4 mmHg. 3. The mitral valve is normal in  structure. Trivial mitral valve regurgitation. 4. The aortic valve is tricuspid. Aortic valve regurgitation is not visualized. No aortic stenosis is present. 5. The inferior vena cava is normal in size with greater than 50% respiratory variability, suggesting right atrial pressure of 3 mmHg.  FINDINGS Left Ventricle: Left ventricular ejection fraction, by estimation, is 60 to 65%. The left ventricle has normal function. The left ventricle has no regional wall motion abnormalities. The left ventricular internal cavity size was normal in size. There is mild left ventricular hypertrophy. Left ventricular diastolic parameters were normal.  Right Ventricle: The right ventricular size is normal. No increase in right ventricular wall thickness. Right ventricular systolic function is normal. There is normal pulmonary artery systolic pressure. The tricuspid regurgitant velocity is 2.57 m/s, and with an assumed right atrial pressure of 3 mmHg, the estimated right ventricular systolic pressure is 29.4 mmHg.  Left Atrium: Left atrial size was normal in size.  Right Atrium: Right atrial size was normal in size.  Pericardium: There is no evidence of pericardial effusion.  Mitral Valve: The mitral valve is normal in structure. Trivial mitral valve regurgitation.  Tricuspid Valve: The tricuspid valve is normal in structure. Tricuspid valve regurgitation is trivial.  Aortic Valve: The aortic valve is tricuspid. Aortic valve regurgitation is not visualized. No aortic stenosis is present.  Pulmonic Valve: The pulmonic valve was grossly normal. Pulmonic valve regurgitation is not visualized.  Aorta: The aortic root and ascending aorta are structurally normal, with no evidence of dilitation.  Venous: The inferior vena cava is normal in size with greater than 50% respiratory variability, suggesting right atrial pressure of 3 mmHg.  IAS/Shunts: The interatrial septum was not well visualized.   LEFT  VENTRICLE PLAX 2D LVIDd:         3.70 cm  Diastology LVIDs:         2.10 cm  LV e' lateral:   6.42 cm/s LV PW:         1.10 cm  LV E/e' lateral: 8.1 LV IVS:        1.20 cm  LV e' medial:    7.62 cm/s LVOT diam:     1.90 cm  LV E/e' medial:  6.8 LV SV:         80 LV SV Index:   44 LVOT Area:     2.84 cm   RIGHT  VENTRICLE RV Basal diam:  2.50 cm RV S prime:     9.79 cm/s TAPSE (M-mode): 2.6 cm RVSP:           29.4 mmHg  LEFT ATRIUM             Index       RIGHT ATRIUM           Index LA diam:        3.50 cm 1.92 cm/m  RA Pressure: 3.00 mmHg LA Vol (A2C):   35.6 ml 19.49 ml/m RA Area:     9.83 cm LA Vol (A4C):   33.2 ml 18.17 ml/m RA Volume:   16.10 ml  8.81 ml/m LA Biplane Vol: 36.9 ml 20.20 ml/m AORTIC VALVE LVOT Vmax:   107.00 cm/s LVOT Vmean:  67.500 cm/s LVOT VTI:    0.282 m  AORTA Ao Root diam: 2.60 cm  MITRAL VALVE               TRICUSPID VALVE MV Area (PHT): 2.95 cm    TR Peak grad:   26.4 mmHg MV Decel Time: 257 msec    TR Vmax:        257.00 cm/s MV E velocity: 51.80 cm/s  Estimated RAP:  3.00 mmHg MV A velocity: 99.40 cm/s  RVSP:           29.4 mmHg MV E/A ratio:  0.52 SHUNTS Systemic VTI:  0.28 m Systemic Diam: 1.90 cm  Epifanio Lesches MD Electronically signed by Epifanio Lesches MD Signature Date/Time: 03/25/2020/9:58:15 PM    Final    MONITORS  LONG TERM MONITOR (3-14 DAYS) 12/23/2022  Narrative   Patient had a minimum heart rate of 49 bpm, maximum heart rate of 132 bpm, and average heart rate of 71 bpm.   Predominant underlying rhythm was sinus rhythm.   Isolated PACs were rare (<1.0%).   Isolated PVCs were rare (<1.0%).   Triggered and diary events associated with sinus rhythm with PVCs.  No malignant arrhythmias.            Recent Labs: 05/25/2023: ALT 18; BUN 12; Creatinine, Ser 0.90; Hemoglobin 12.9; Platelets 255; Potassium 3.7; Sodium 135  Recent Lipid Panel No results found for: "CHOL", "TRIG", "HDL", "CHOLHDL",  "VLDL", "LDLCALC", "LDLDIRECT"   Physical Exam:    VS:  BP 130/60   Pulse 76   Ht 5\' 3"  (1.6 m)   Wt 193 lb (87.5 kg)   SpO2 96%   BMI 34.19 kg/m     Wt Readings from Last 3 Encounters:  06/01/23 193 lb (87.5 kg)  05/24/23 195 lb 12.8 oz (88.8 kg)  01/16/23 183 lb (83 kg)    GEN:  Well nourished, well developed in no acute distress HEENT: Normal NECK: No JVD CARDIAC: RRR, no murmurs, rubs, gallops RESPIRATORY:  Clear to auscultation without rales, wheezing or rhonchi  ABDOMEN: Soft, non-tender, non-distended MUSCULOSKELETAL:  No edema; No deformity  SKIN: Warm and dry NEUROLOGIC:  Alert and oriented x 3 PSYCHIATRIC:  Normal affect   ASSESSMENT:    1. Pre-op evaluation   2. Essential hypertension   3. Mixed hyperlipidemia     PLAN:    Preoperative Risk Assessment - The Revised Cardiac Risk Index = 0 this which equates to 0.4%: very low estimated risk of perioperative myocardial infarction, pulmonary edema, ventricular fibrillation, cardiac arrest, or complete heart block.  - DASI score of 15.45 associated with 4364 functional mets - she has one isolated HTN episode; widely  patent cath in 2021 - will get EKG and if similar to prior, no further cardiac testing is recommended prior to surgery; The patient may proceed to surgery at acceptable risk.  period.   - ADDENDUM no evidence of ischemia on EKG; reasonable to proceed with surgery  HTN - continue current therapy  HLD - continue statin   One year with me        Medication Adjustments/Labs and Tests Ordered: Current medicines are reviewed at length with the patient today.  Concerns regarding medicines are outlined above.  Orders Placed This Encounter  Procedures   EKG 12-Lead   No orders of the defined types were placed in this encounter.   Patient Instructions  Medication Instructions:  Your physician recommends that you continue on your current medications as directed. Please refer to the Current  Medication list given to you today.  *If you need a refill on your cardiac medications before your next appointment, please call your pharmacy*   Lab Work: NONE If you have labs (blood work) drawn today and your tests are completely normal, you will receive your results only by: MyChart Message (if you have MyChart) OR A paper copy in the mail If you have any lab test that is abnormal or we need to change your treatment, we will call you to review the results.   Testing/Procedures: NONE   Follow-Up: At [facility], you and your health needs are our priority.  As part of our continuing mission to provide you with exceptional heart care, we have created designated Provider Care Teams.  These Care Teams include your primary Cardiologist (physician) and Advanced Practice Providers (APPs -  Physician Assistants and Nurse Practitioners) who all work together to provide you with the care you need, when you need it.  We recommend signing up for the patient portal called "MyChart".  Sign up information is provided on this After Visit Summary.  MyChart is used to connect with patients for Virtual Visits (Telemedicine).  Patients are able to view lab/test results, encounter notes, upcoming appointments, etc.  Non-urgent messages can be sent to your provider as well.   To learn more about what you can do with MyChart, go to [email or web address].    Your next appointment:   1 year(s)  Provider:   Riley Lam, MD       Signed, Christell Constant, MD  06/01/2023 11:50 AM    [facility]

## 2023-06-01 NOTE — Patient Instructions (Signed)
Medication Instructions:  Your physician recommends that you continue on your current medications as directed. Please refer to the Current Medication list given to you today.  *If you need a refill on your cardiac medications before your next appointment, please call your pharmacy*   Lab Work: NONE If you have labs (blood work) drawn today and your tests are completely normal, you will receive your results only by: [place] (if you have MyChart) OR A paper copy in the mail If you have any lab test that is abnormal or we need to change your treatment, we will call you to review the results.   Testing/Procedures: NONE   Follow-Up: At [facility], you and your health needs are our priority.  As part of our continuing mission to provide you with exceptional heart care, we have created designated Provider Care Teams.  These Care Teams include your primary Cardiologist (physician) and Advanced Practice Providers (APPs -  Physician Assistants and Nurse Practitioners) who all work together to provide you with the care you need, when you need it.  We recommend signing up for the patient portal called "MyChart".  Sign up information is provided on this After Visit Summary.  MyChart is used to connect with patients for Virtual Visits (Telemedicine).  Patients are able to view lab/test results, encounter notes, upcoming appointments, etc.  Non-urgent messages can be sent to your provider as well.   To learn more about what you can do with MyChart, go to [email or web address].    Your next appointment:   1 year(s)  Provider:   Rudean Haskell, MD

## 2023-06-08 NOTE — Progress Notes (Signed)
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   HISTORY OF PRESENT ILLNESS: Carmen Fischer is a 81 y.o. female seen at the request of Dr. Magnus Ivan for a newly diagnosed left breast cancer. She originally presented with calcifications on her screening mammogram. Diagnostic mammogram showed calcifications that span 1.2 cm. Accordingly, patient proceeded with a biopsy of the left breast calcifications on 05/03/23. 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 ER status: 80% positive with strong staining intensity; PR status negative; Grade 3.  She saw Dr. Magnus Ivan on 05/12/23 for surgical consultation. He thought she was a good candidate for breast conserving surgery. She is scheduled for a lumpectomy on 06/20/23. Patient met with Dr. Al Pimple on 05/24/23. She recommended adjuvant antihormone therapy.    PREVIOUS RADIATION THERAPY: No   PAST MEDICAL HISTORY:  Past Medical History:  Diagnosis Date   Arthritis    Breast cancer (HCC)    stage 0 per pt   Glaucoma    High cholesterol    High cholesterol    Hypertension        PAST SURGICAL HISTORY: Past Surgical History:  Procedure Laterality Date   ABDOMINAL HYSTERECTOMY     BREAST BIOPSY Left 05/03/2023   MM LT BREAST BX W LOC DEV 1ST LESION IMAGE BX SPEC STEREO GUIDE 05/03/2023 [facility] MAMMO[facility]   CHOLECYSTECTOMY     LEFT HEART CATH AND CORONARY ANGIOGRAPHY N/A 03/10/2020   Procedure: LEFT HEART CATH AND CORONARY ANGIOGRAPHY;  Surgeon: [place], Peter M, MD;  Location: [facility];  Service: Cardiovascular;  Laterality: N/A;     FAMILY HISTORY:  Family History  Problem Relation  Age of Onset   Heart attack Mother    Heart attack Father    Endometriosis Daughter      SOCIAL HISTORY:  reports that she has never smoked. She has never used smokeless tobacco. She reports current alcohol use. She reports that she does not use drugs. She moved to [place] from [place] when she retired. Her daughter lives in [place], but plans to be present for her mother's treatment.    ALLERGIES: Shellfish allergy and Prednisone   MEDICATIONS:  Current Outpatient Medications  Medication Sig Dispense Refill   acetaminophen (TYLENOL) 500 MG tablet Take 500-1,000 mg by mouth every 6 (six) hours as needed (pain.).     cholecalciferol (VITAMIN D3) 25 MCG (1000 UNIT) tablet Take 1,000 Units by mouth in the morning.     cycloSPORINE (RESTASIS) 0.05 % ophthalmic emulsion Place 1 drop into both eyes 2 (two) times daily.     desloratadine (CLARINEX) 5 MG tablet Take 5 mg by mouth in the morning.     hydrochlorothiazide (HYDRODIURIL) 25 MG tablet Take 25 mg by mouth in the morning.     KLOR-CON M20 20 MEQ tablet Take 20 mEq by mouth daily.     Magnesium Bisglycinate (MAG GLYCINATE PO) Take 240 mg by mouth at bedtime.     Multiple Vitamin (MULTIVITAMIN WITH MINERALS) TABS tablet Take 1 tablet by mouth daily.     omeprazole (PRILOSEC) 20 MG capsule Take 20 mg by mouth at bedtime.  OVER THE COUNTER MEDICATION Take 1 capsule by mouth in the morning and at bedtime. HydroEye Vitamin (proprietary blend of omega fatty acids (EPA, DHA & GLA)     polyethylene glycol (MIRALAX / GLYCOLAX) 17 g packet Take 17 g by mouth daily as needed for moderate constipation.      simvastatin (ZOCOR) 20 MG tablet Take 20 mg by mouth at bedtime.     spironolactone (ALDACTONE) 25 MG tablet Take 0.5 tablets (12.5 mg total) by mouth daily. 45 tablet 3   Travoprost, BAK Free, (TRAVATAN) 0.004 % SOLN ophthalmic solution Place 1 drop into both eyes at bedtime.     vitamin E 180 MG (400 UNITS) capsule Take 400 Units by  mouth in the morning.     aspirin EC 81 MG tablet Take 81 mg by mouth daily. (Patient not taking: Reported on 06/09/2023)     No current facility-administered medications for this encounter.     REVIEW OF SYSTEMS: She verbalizes no specific breast complaints.     PHYSICAL EXAM:  Wt Readings from Last 3 Encounters:  06/09/23 194 lb 6 oz (88.2 kg)  06/01/23 193 lb (87.5 kg)  05/24/23 195 lb 12.8 oz (88.8 kg)   Temp Readings from Last 3 Encounters:  06/09/23 (!) 97.1 F (36.2 C) (Temporal)  05/25/23 98 F (36.7 C) (Oral)  05/24/23 98.3 F (36.8 C) (Oral)   BP Readings from Last 3 Encounters:  06/09/23 133/72  06/01/23 130/60  05/25/23 (!) 142/74   Pulse Readings from Last 3 Encounters:  06/09/23 73  06/01/23 76  05/25/23 62   Pain Assessment Pain Score: 0-No pain/10  In general this is a well appearing female in no acute distress. She's alert and oriented x4 and appropriate throughout the examination. Cardiopulmonary assessment is negative for acute distress and she exhibits normal effort.     ECOG = 0  0 - Asymptomatic (Fully active, able to carry on all predisease activities without restriction)  1 - Symptomatic but completely ambulatory (Restricted in physically strenuous activity but ambulatory and able to carry out work of a light or sedentary nature. For example, light housework, office work)  2 - Symptomatic, <50% in bed during the day (Ambulatory and capable of all self care but unable to carry out any work activities. Up and about more than 50% of waking hours)  3 - Symptomatic, >50% in bed, but not bedbound (Capable of only limited self-care, confined to bed or chair 50% or more of waking hours)  4 - Bedbound (Completely disabled. Cannot carry on any self-care. Totally confined to bed or chair)  5 - Death   Santiago Glad MM, Creech RH, Tormey DC, et al. [phone]). "Toxicity and response criteria of the [facility]". Am. Evlyn Clines. Oncol. 5 (6):  649-55    LABORATORY DATA:  Lab Results  Component Value Date   WBC 5.1 05/25/2023   HGB 12.9 05/25/2023   HCT 38.9 05/25/2023   MCV 83.5 05/25/2023   PLT 255 05/25/2023   Lab Results  Component Value Date   NA 135 05/25/2023   K 3.7 05/25/2023   CL 97 (L) 05/25/2023   CO2 28 05/25/2023   Lab Results  Component Value Date   ALT 18 05/25/2023   AST 26 05/25/2023   ALKPHOS 59 05/25/2023   BILITOT 0.6 05/25/2023      RADIOGRAPHY: CT HEAD WO CONTRAST  Result Date: 05/25/2023 CLINICAL DATA:  Ataxia EXAM: CT HEAD WITHOUT CONTRAST TECHNIQUE: Contiguous axial images  were obtained from the base of the skull through the vertex without intravenous contrast. RADIATION DOSE REDUCTION: This exam was performed according to the departmental dose-optimization program which includes automated exposure control, adjustment of the mA and/or kV according to patient size and/or use of iterative reconstruction technique. COMPARISON:  CT 01/16/2023, 07/31/2022 brain MRI 02/08/2020 FINDINGS: Brain: No acute intracranial hemorrhage. No focal mass lesion. No CT evidence of acute infarction. No midline shift or mass effect. No hydrocephalus. Basilar cisterns are patent. Prominent large RIGHT triangular sulcus again noted. Vascular: No hyperdense vessel or unexpected calcification. Skull: Normal. Negative for fracture or focal lesion. Sinuses/Orbits: Paranasal sinuses and mastoid air cells are clear. Orbits are clear. Other: None. IMPRESSION: 1. No acute intracranial findings. 2. Unchanged from prior Electronically Signed   By: Genevive Bi M.D.   On: 05/25/2023 17:00   DG Chest 2 View  Result Date: 05/25/2023 CLINICAL DATA:  Chest pain. EXAM: CHEST - 2 VIEW COMPARISON:  July 31, 2022. FINDINGS: The heart size and mediastinal contours are within normal limits. Left lung is clear. Stable minimal right basilar scarring is noted. The visualized skeletal structures are unremarkable. IMPRESSION: No active  cardiopulmonary disease. Electronically Signed   By: Lupita Raider M.D.   On: 05/25/2023 16:46       IMPRESSION/PLAN: 1. High grade DCIS; ER positive, PR negative of the left breast  Dr. Mitzi Hansen discussed the pathology findings and reviewed the nature of early stage breast cancer.    We discussed the risks, benefits, short, and long term effects of radiotherapy, as well as the curative intent, and the patient expressed understanding. Considering her favorable, early-stage disease, radiation therapy offers a small benefit for locoregional disease control. However, the patient remains interested in proceeding with treatment, recognizing that there is still some benefit.    After a thorough discussion of standard hypofractionation over 3-4 weeks, we discussed ultra hypofractionated radiation therapy (radiation once weekly for 5 weeks). We discussed that this approach is a bit less standard than longer regimens.  This method has shown a slight increase in normal tissue side effects over the 3 week hypofractionation standard at 5 years of follow-up. This includes effects like induration, and edema.  However, for elderly patients that are keen on the convenience of minimizing the number of procedures/visits to the cancer center, this is a nevertheless a reasonable approach to consider and the vast majority of patients do very well.    We will see her back a few weeks after surgery to discuss the simulation process and review her treatment options after evaluating surgical pathology. We anticipate starting radiotherapy about 4-6 weeks after surgery.   We will await her referral back to [place] after her lumpectomy. Patient knows to call with any questions or concerns in the meantime. We look forward to participating in her care.   In a visit lasting 60 minutes, greater than 50% of the time was spent face to face discussing the patient's condition, in preparation for the discussion, and coordinating the  patient's care.   The above documentation reflects my direct findings during this shared patient visit. Please see the separate note by Dr. Mitzi Hansen on this date for the remainder of the patient's plan of care.    Joyice Faster, PA-C    **Disclaimer: This note was dictated with voice recognition software. Similar sounding words can inadvertently be transcribed and this note may contain transcription errors which may not have been corrected upon publication of note.**

## 2023-06-08 NOTE — Progress Notes (Signed)
New Breast Cancer Diagnosis: Left Breast UOQ  Did patient present with symptoms (if so, please note symptoms) or screening mammography?:Screening Calcifications    Location and Extent of disease :Located in the upper outer quadrant of the left breast, measured 1.2 cm in greatest dimension. Adenopathy no.  Histology per Pathology Report: grade 3, DCIS 05/03/2023  Receptor Status: ER(positive), PR (negative), Her2-neu (), Ki-(%)  Surgeon and surgical plan, if any:  Dr. Magnus Ivan 05/12/2023 -She is interested in breast conservation.  -discussed referral to medical and radiation oncologist for further care.  -Left Breast radioactive seed guided lumpectomy 06/20/2023   Medical oncologist, treatment if any:   Dr. Al Pimple 05/24/2023 Recommendation: 1. Breast conserving surgery 2. Followed by adjuvant radiation therapy 3. Followed by antiestrogen therapy with tamoxifen/aromatase inhibitors based on menopausal status 5 years if repeat prognostics still show ER positivity   Family History of Breast/Ovarian/Prostate Cancer: No  Lymphedema issues, if any: She reports occasional puffiness in her breast since her biopsy.     Pain issues, if any: She reports occasional throbbing in her breast since her biopsy.     SAFETY ISSUES: Prior radiation? No Pacemaker/ICD? No Possible current pregnancy? Hysterectomy Is the patient on methotrexate? No  Current Complaints / other details:

## 2023-06-09 ENCOUNTER — Ambulatory Visit
Admission: RE | Admit: 2023-06-09 | Discharge: 2023-06-09 | Disposition: A | Discharge status: Home / Self Care | Payer: Medicare Other | Source: Ambulatory Visit | Attending: Radiation Oncology | Admitting: Radiation Oncology

## 2023-06-09 ENCOUNTER — Other Ambulatory Visit: Payer: Self-pay

## 2023-06-09 ENCOUNTER — Encounter: Payer: Self-pay | Admitting: Radiation Oncology

## 2023-06-09 VITALS — BP 133/72 | HR 73 | Temp 97.1°F | Resp 18 | Ht 63.0 in | Wt 194.4 lb

## 2023-06-09 DIAGNOSIS — Z803 Family history of malignant neoplasm of breast: Secondary | ICD-10-CM | POA: Insufficient documentation

## 2023-06-09 DIAGNOSIS — Z170 Estrogen receptor positive status [ER+]: Secondary | ICD-10-CM | POA: Diagnosis not present

## 2023-06-09 DIAGNOSIS — J984 Other disorders of lung: Secondary | ICD-10-CM | POA: Insufficient documentation

## 2023-06-09 DIAGNOSIS — Z79899 Other long term (current) drug therapy: Secondary | ICD-10-CM | POA: Insufficient documentation

## 2023-06-09 DIAGNOSIS — M129 Arthropathy, unspecified: Secondary | ICD-10-CM | POA: Insufficient documentation

## 2023-06-09 DIAGNOSIS — E7800 Pure hypercholesterolemia, unspecified: Secondary | ICD-10-CM | POA: Diagnosis not present

## 2023-06-09 DIAGNOSIS — C50912 Malignant neoplasm of unspecified site of left female breast: Secondary | ICD-10-CM

## 2023-06-09 DIAGNOSIS — I10 Essential (primary) hypertension: Secondary | ICD-10-CM | POA: Diagnosis not present

## 2023-06-09 DIAGNOSIS — D0512 Intraductal carcinoma in situ of left breast: Secondary | ICD-10-CM | POA: Diagnosis not present

## 2023-06-09 DIAGNOSIS — Z7982 Long term (current) use of aspirin: Secondary | ICD-10-CM | POA: Diagnosis not present

## 2023-06-12 NOTE — Pre-Procedure Instructions (Signed)
Surgical Instructions   Your procedure is scheduled on Tuesday, August 20th. Report to [facility] Main Entrance "A" at 05:30 A.M., then check in with the Admitting office. Any questions or running late day of surgery: call [phone]  Questions prior to your surgery date: call [phone], Monday-Friday, 8am-4pm. If you experience any cold or flu symptoms such as cough, fever, chills, shortness of breath, etc. between now and your scheduled surgery, please notify us at the above number.     Remember:  Do not eat after midnight the night before your surgery   You may drink clear liquids until 04:30 AM the morning of your surgery.   Clear liquids allowed are: Water, Non-Citrus Juices (without pulp), Carbonated Beverages, Clear Tea, Black Coffee Only (NO MILK, CREAM OR POWDERED CREAMER of any kind), and Gatorade.  Patient Instructions  The night before surgery:  No food after midnight. ONLY clear liquids after midnight  The day of surgery (if you do NOT have diabetes):  Drink ONE (1) Pre-Surgery Clear Ensure by 04:30 AM the morning of surgery. Drink in one sitting. Do not sip.  This drink was given to you during your hospital  pre-op appointment visit.  Nothing else to drink after completing the  Pre-Surgery Clear Ensure.          If you have questions, please contact your surgeon's office.    Take these medicines the morning of surgery with A SIP OF WATER  cycloSPORINE (RESTASIS)  desloratadine (CLARINEX)   May take these medicines IF NEEDED: acetaminophen (TYLENOL)    One week prior to surgery, STOP taking any Aspirin (unless otherwise instructed by your surgeon) Aleve, Naproxen, Ibuprofen, Motrin, Advil, Goody's, BC's, all herbal medications, fish oil, and non-prescription vitamins.                     Do NOT Smoke (Tobacco/Vaping) for 24 hours prior to your procedure.  If you use a CPAP at night, you may bring your mask/headgear for your overnight stay.   You will  be asked to remove any contacts, glasses, piercing's, hearing aid's, dentures/partials prior to surgery. Please bring cases for these items if needed.    Patients discharged the day of surgery will not be allowed to drive home, and someone needs to stay with them for 24 hours.  SURGICAL WAITING ROOM VISITATION Patients may have no more than 2 support people in the waiting area - these visitors may rotate.   Pre-op nurse will coordinate an appropriate time for 1 ADULT support person, who may not rotate, to accompany patient in pre-op.  Children under the age of 61 must have an adult with them who is not the patient and must remain in the main waiting area with an adult.  If the patient needs to stay at the hospital during part of their recovery, the visitor guidelines for inpatient rooms apply.  Please refer to the [facility] website for the visitor guidelines for any additional information.   If you received a COVID test during your pre-op visit  it is requested that you wear a mask when out in public, stay away from anyone that may not be feeling well and notify your surgeon if you develop symptoms. If you have been in contact with anyone that has tested positive in the last 10 days please notify you surgeon.      Pre-operative CHG Bathing Instructions   You can play a key role in reducing the risk of infection after  surgery. Your skin needs to be as free of germs as possible. You can reduce the number of germs on your skin by washing with CHG (chlorhexidine gluconate) soap before surgery. CHG is an antiseptic soap that kills germs and continues to kill germs even after washing.   DO NOT use if you have an allergy to chlorhexidine/CHG or antibacterial soaps. If your skin becomes reddened or irritated, stop using the CHG and notify one of our RNs at [phone].              TAKE A SHOWER THE NIGHT BEFORE SURGERY AND THE DAY OF SURGERY    Please keep in mind the following:  DO NOT  shave, including legs and underarms, 48 hours prior to surgery.   You may shave your face before/day of surgery.  Place clean sheets on your bed the night before surgery Use a clean washcloth (not used since being washed) for each shower. DO NOT sleep with pet's night before surgery.  CHG Shower Instructions:  If you choose to wash your hair and private area, wash first with your normal shampoo/soap.  After you use shampoo/soap, rinse your hair and body thoroughly to remove shampoo/soap residue.  Turn the water OFF and apply half the bottle of CHG soap to a CLEAN washcloth.  Apply CHG soap ONLY FROM YOUR NECK DOWN TO YOUR TOES (washing for 3-5 minutes)  DO NOT use CHG soap on face, private areas, open wounds, or sores.  Pay special attention to the area where your surgery is being performed.  If you are having back surgery, having someone wash your back for you may be helpful. Wait 2 minutes after CHG soap is applied, then you may rinse off the CHG soap.  Pat dry with a clean towel  Put on clean pajamas    Additional instructions for the day of surgery: DO NOT APPLY any lotions, deodorants, cologne, or perfumes.   Do not wear jewelry or makeup Do not wear nail polish, gel polish, artificial nails, or any other type of covering on natural nails (fingers and toes) Do not bring valuables to the hospital. [facility] is not responsible for valuables/personal belongings. Put on clean/comfortable clothes.  Please brush your teeth.  Ask your nurse before applying any prescription medications to the skin.

## 2023-06-13 ENCOUNTER — Encounter (HOSPITAL_COMMUNITY): Payer: Self-pay

## 2023-06-13 ENCOUNTER — Other Ambulatory Visit: Payer: Self-pay

## 2023-06-13 ENCOUNTER — Encounter (HOSPITAL_COMMUNITY)
Admission: RE | Admit: 2023-06-13 | Discharge: 2023-06-13 | Disposition: A | Discharge status: Home / Self Care | Payer: Medicare Other | Source: Ambulatory Visit | Attending: Surgery | Admitting: Surgery

## 2023-06-13 DIAGNOSIS — I10 Essential (primary) hypertension: Secondary | ICD-10-CM | POA: Insufficient documentation

## 2023-06-13 DIAGNOSIS — Z01818 Encounter for other preprocedural examination: Secondary | ICD-10-CM | POA: Diagnosis not present

## 2023-06-13 DIAGNOSIS — K219 Gastro-esophageal reflux disease without esophagitis: Secondary | ICD-10-CM | POA: Diagnosis not present

## 2023-06-13 DIAGNOSIS — D0512 Intraductal carcinoma in situ of left breast: Secondary | ICD-10-CM | POA: Insufficient documentation

## 2023-06-13 DIAGNOSIS — E7800 Pure hypercholesterolemia, unspecified: Secondary | ICD-10-CM | POA: Diagnosis not present

## 2023-06-13 DIAGNOSIS — Z01812 Encounter for preprocedural laboratory examination: Secondary | ICD-10-CM | POA: Diagnosis present

## 2023-06-13 HISTORY — DX: Gastro-esophageal reflux disease without esophagitis: K21.9

## 2023-06-13 NOTE — Progress Notes (Signed)
PCP - Dr. Blair Heys Cardiologist - Dr. Riley Lam  PPM/ICD - denies   Chest x-ray - 05/25/23 EKG - 06/01/23 Stress Test - 11/19/14 ECHO - 03/25/20 Cardiac Cath - 03/10/20  Sleep Study - denies   DM- denies  Blood Thinner Instructions: n/a Aspirin Instructions: Pt stopped taking 8/1 (per Dr. Magnus Ivan)  ERAS Protcol - yes PRE-SURGERY Ensure given at PAT  COVID TEST- n/a   Anesthesia review: yes, breast seed placement; Cardiac hx  Patient denies shortness of breath, fever, cough and chest pain at PAT appointment   All instructions explained to the patient, with a verbal understanding of the material. Patient agrees to go over the instructions while at home for a better understanding.  The opportunity to ask questions was provided.

## 2023-06-14 ENCOUNTER — Encounter (HOSPITAL_COMMUNITY): Payer: Self-pay

## 2023-06-14 ENCOUNTER — Telehealth: Payer: Self-pay | Admitting: *Deleted

## 2023-06-14 DIAGNOSIS — E7800 Pure hypercholesterolemia, unspecified: Secondary | ICD-10-CM | POA: Diagnosis not present

## 2023-06-14 DIAGNOSIS — K5900 Constipation, unspecified: Secondary | ICD-10-CM | POA: Diagnosis not present

## 2023-06-14 DIAGNOSIS — I10 Essential (primary) hypertension: Secondary | ICD-10-CM | POA: Diagnosis not present

## 2023-06-14 DIAGNOSIS — Z0000 Encounter for general adult medical examination without abnormal findings: Secondary | ICD-10-CM | POA: Diagnosis not present

## 2023-06-14 DIAGNOSIS — H409 Unspecified glaucoma: Secondary | ICD-10-CM | POA: Diagnosis not present

## 2023-06-14 DIAGNOSIS — J309 Allergic rhinitis, unspecified: Secondary | ICD-10-CM | POA: Diagnosis not present

## 2023-06-14 DIAGNOSIS — Z1331 Encounter for screening for depression: Secondary | ICD-10-CM | POA: Diagnosis not present

## 2023-06-14 DIAGNOSIS — K219 Gastro-esophageal reflux disease without esophagitis: Secondary | ICD-10-CM | POA: Diagnosis not present

## 2023-06-14 DIAGNOSIS — D0510 Intraductal carcinoma in situ of unspecified breast: Secondary | ICD-10-CM | POA: Diagnosis not present

## 2023-06-14 NOTE — Telephone Encounter (Signed)
This nurse completed [place] daughter Lisa's FMLA and [employer] paperwork.  Currently to collaborative pick up bin for provider review, addend, signature.  Awaiting return to this nurse to return to [place] and [place].

## 2023-06-14 NOTE — Progress Notes (Signed)
Anesthesia Chart Review:  Case: [id number] Date/Time: 06/20/23 0715   Procedure: LEFT BREAST RADIOACTIVE SEED GUIDED LUMPECTOMY (Left) - LMA   Anesthesia type: General   Pre-op diagnosis: LEFT BREAST DUCTAL CARCINOMA IN SITU   [facility] / [facility]   Surgeons: Abigail Miyamoto, MD       DISCUSSION: Patient is an 82 year old female scheduled for the above procedure.  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  She had normal coronaries and essentially normal echo in May 2021. Seen in ED on 05/25/23 for heartburn/chest pain and dizziness. She also felt a little off balance, but otherwise no headaches, numbness, weakness, or N/V. SBP was 170's despite taking her home BP medications. EKG stable showing NSR, LAFB. Troponin negative. Head CT without acute findings. No focal deficits on neuro exam. Symptoms resolved in ED and discharged with return precautions. She has since had follow-up with cardiologist Dr. Izora Ribas on 06/01/23. He noted ED visit. No recurrent chest symptoms and BP improved. She walks ~ 5K-7K steps a day mostly at home, and is able to do steps when she goes to church.  No changes made with one year follow-up planned. In regards to Preoperative Risk Assessment, he wrote: " -The Revised Cardiac Risk Index = 0 this which equates to 0.4%: very low estimated risk of perioperative myocardial infarction, pulmonary edema, ventricular fibrillation, cardiac arrest, or complete heart block.  - DASI score of 15.45 associated with 4364 functional mets - she has one isolated HTN episode; widely patent cath in 2021 - will get EKG and if similar to prior, no further cardiac testing is recommended prior to surgery; The patient may proceed to surgery at acceptable risk.  period.   - ADDENDUM no evidence of ischemia on EKG; reasonable to proceed with surgery".  RSL is scheduled for 06/19/23 at 2:30 PM at [facility].  Anesthesia team to evaluate on the day of surgery. She had labs at [facility] on 05/25/23. Last ASA 06/01/23.    VS: BP (!) 148/89   Pulse 70   Temp 36.6 C   Resp 18   Ht 5\' 3"  (1.6 m)   Wt 87.7 kg   SpO2 98%   BMI 34.24 kg/m    PROVIDERS: Blair Heys, MD is PCP  Riley Lam, MD is cardiologist Rachel Moulds, MD is Unknown Jim, MD is RAD-ONC   LABS: Labs in [facility] as of 05/25/23 include: Lab Results  Component Value Date   WBC 5.1 05/25/2023   HGB 12.9 05/25/2023   HCT 38.9 05/25/2023   PLT 255 05/25/2023   GLUCOSE 90 05/25/2023   ALT 18 05/25/2023   AST 26 05/25/2023   NA 135 05/25/2023   K 3.7 05/25/2023   CL 97 (L) 05/25/2023   CREATININE 0.90 05/25/2023   BUN 12 05/25/2023   CO2 28 05/25/2023   INR 1.0 05/25/2023     IMAGES: CT Head 05/25/23:  IMPRESSION: 1. No acute intracranial findings. Prominent large RIGHT triangular sulcus again noted.  2. Unchanged from prior  CXR 05/25/23: FINDINGS: The heart size and mediastinal contours are within normal limits. Left lung is clear. Stable minimal right basilar scarring is noted. The visualized skeletal structures are unremarkable. IMPRESSION: No active cardiopulmonary disease.   EKG: 06/01/23: Normal sinus rhythm Left anterior fascicular block When compared with ECG of 25-May-2023 15:37, No significant change was found Confirmed by Riley Lam ([id number]) on 06/01/2023 11:49:37 AM   CV: Long term Zio  monitor 12/01/22 - 12/09/22:   Patient had a minimum heart rate of 49 bpm, maximum heart rate of 132 bpm, and average heart rate of 71 bpm.   Predominant underlying rhythm was sinus rhythm.   Isolated PACs were rare (<1.0%).   Isolated PVCs were rare (<1.0%).   Triggered and diary events associated with sinus rhythm with PVCs. No malignant arrhythmias.   Echo 03/25/20: IMPRESSIONS   1. Left ventricular ejection fraction, by estimation, is 60 to 65%. The  left ventricle has normal function.  The left ventricle has no regional  wall motion abnormalities. There is mild left ventricular hypertrophy.  Left ventricular diastolic parameters  were normal.   2. Right ventricular systolic function is normal. The right ventricular  size is normal. There is normal pulmonary artery systolic pressure. The  estimated right ventricular systolic pressure is 29.4 mmHg.   3. The mitral valve is normal in structure. Trivial mitral valve  regurgitation.   4. The aortic valve is tricuspid. Aortic valve regurgitation is not  visualized. No aortic stenosis is present.   5. The inferior vena cava is normal in size with greater than 50%  respiratory variability, suggesting right atrial pressure of 3 mmHg.    Cardiac cath 03/10/20: The left ventricular systolic function is normal. LV end diastolic pressure is normal. The left ventricular ejection fraction is 55-65% by visual estimate.   1.  Normal coronary anatomy 2. Normal LV function 3. Normal LVEDP   Plan: consider other causes of chest pain.    Past Medical History:  Diagnosis Date   Arthritis    Breast cancer (HCC)    stage 0 per pt   GERD (gastroesophageal reflux disease)    Glaucoma    High cholesterol    Hypertension     Past Surgical History:  Procedure Laterality Date   ABDOMINAL HYSTERECTOMY     BREAST BIOPSY Left 05/03/2023   MM LT BREAST BX W LOC DEV 1ST LESION IMAGE BX SPEC STEREO GUIDE 05/03/2023 [facility] M[facility]MOGRAP[facility]   CHOLECYSTECTOMY     LEFT HEART CATH AND CORONARY ANGIOGRAPHY N/A 03/10/2020   Procedure: LEFT HEART CATH AND CORONARY ANGIOGRAPHY;  Surgeon: [place], Peter M, MD;  Location: [facility] INVASIVE CV LAB;  Service: Cardiovascular;  Laterality: N/A;   TUBAL LIGATION  1974    MEDICATIONS:  acetaminophen (TYLENOL) 500 MG tablet   aspirin EC 81 MG tablet   cholecalciferol (VITAMIN D3) 25 MCG (1000 UNIT) tablet   cycloSPORINE (RESTASIS) 0.05 % ophthalmic emulsion   desloratadine (CLARINEX) 5 MG tablet    hydrochlorothiazide (HYDRODIURIL) 25 MG tablet   KLOR-CON M20 20 MEQ tablet   Magnesium Bisglycinate (MAG GLYCINATE PO)   Multiple Vitamin (MULTIVITAMIN WITH MINERALS) TABS tablet   omeprazole (PRILOSEC) 20 MG capsule   OVER THE COUNTER MEDICATION   polyethylene glycol (MIRALAX / GLYCOLAX) 17 g packet   simvastatin (ZOCOR) 20 MG tablet   spironolactone (ALDACTONE) 25 MG tablet   Travoprost, BAK Free, (TRAVATAN) 0.004 % SOLN ophthalmic solution   vitamin E 180 MG (400 UNITS) capsule   No current facility-administered medications for this encounter.    Shonna Chock, PA-C Surgical Short Stay/Anesthesiology [facility] Phone [phone] [facility] Phone [phone] 06/14/2023 11:56 AM

## 2023-06-14 NOTE — Anesthesia Preprocedure Evaluation (Addendum)
Anesthesia Evaluation  Patient identified by MRN, date of birth, ID band Patient awake    Reviewed: Allergy & Precautions, NPO status , Patient's Chart, lab work & pertinent test results, reviewed documented beta blocker date and time   History of Anesthesia Complications Negative for: history of anesthetic complications  Airway Mallampati: III  TM Distance: >3 FB   Mouth opening: Limited Mouth Opening  Dental no notable dental hx.    Pulmonary neg COPD, Not current smoker, neg PE   breath sounds clear to auscultation       Cardiovascular hypertension, (-) CAD, (-) Past MI and (-) CABG (-) Valvular Problems/Murmurs Rhythm:Regular Rate:Normal     Neuro/Psych neg Seizures    GI/Hepatic ,GERD  ,,(+) neg Cirrhosis        Endo/Other  neg diabetes    Renal/GU Renal disease     Musculoskeletal  (+) Arthritis ,    Abdominal   Peds  Hematology   Anesthesia Other Findings   Reproductive/Obstetrics                              Anesthesia Physical Anesthesia Plan  ASA: 2  Anesthesia Plan: General   Post-op Pain Management:    Induction: Intravenous  PONV Risk Score and Plan: 1 and Ondansetron and Dexamethasone  Airway Management Planned: LMA  Additional Equipment:   Intra-op Plan:   Post-operative Plan: Extubation in OR  Informed Consent: I have reviewed the patients History and Physical, chart, labs and discussed the procedure including the risks, benefits and alternatives for the proposed anesthesia with the patient or authorized representative who has indicated his/her understanding and acceptance.     Dental advisory given  Plan Discussed with: CRNA  Anesthesia Plan Comments: (PAT note written 06/14/2023 by Shonna Chock, PA-C.  )        Anesthesia Quick Evaluation

## 2023-06-15 NOTE — Telephone Encounter (Signed)
Carmen Fischer daughter [name] [phone]) called to "check status of forms.  Her surgery is next week and I will be leaving to care for her.  This is my mother." Advised this nurse completed and revised forms with surgeons name with [facility] our provider.  With any future forms do not complete the Health Care Provider sections of forms.  Paperwork sent to provider for review and signature yesterday.  Currently working remote.  Tomorrow will check status and return both forms. "E-mail the forms to me.  Return both to the same place.  Yes, I was told the surgeon's certificate was approved but missing information and I do not know when the radiation will be."  Advised we do not know either.  Form was completed with expected routine treatment scheduling guidelines with f/u and schedule pending lumpectomy recovery and results.  Scheduled thus far with Med/Onc 07/13/2023.  MED/ONC & RAD/ONC LOA requested early so more information may be needed Will email form if completed or call.  Currently no further questions or needs.

## 2023-06-19 ENCOUNTER — Ambulatory Visit: Admission: RE | Admit: 2023-06-19 | Payer: Medicare Other | Source: Ambulatory Visit

## 2023-06-19 DIAGNOSIS — D0512 Intraductal carcinoma in situ of left breast: Secondary | ICD-10-CM

## 2023-06-19 HISTORY — PX: BREAST BIOPSY: SHX20

## 2023-06-19 NOTE — H&P (Signed)
REFERRING PHYSICIAN: Dyanne Carrel* PROVIDER: Wayne Both, MD MRN: [id number] DOB: [birth year]  Subjective   Chief Complaint: New Consultation (LFT BREAST)  History of Present Illness: Carmen Fischer is a 82 y.o. female who is seen as an office consultation for evaluation of New Consultation (LFT BREAST)  This is an 82 year old female who was seen on recent screening mammography to have calcifications in the outer left breast. These measured approximately 1.2 cm. She underwent a biopsy showing high-grade ductal carcinoma in situ. It was 80% ER positive, PR negative. She has had no previous problems regarding her breast and has had no surgery on her breast. There is no family history of breast cancer. She denies nipple discharge. She is otherwise without complaints.  Review of Systems: A complete review of systems was obtained from the patient. I have reviewed this information and discussed as appropriate with the patient. See HPI as well for other ROS.  ROS   Medical History: Past Medical History:  Diagnosis Date  Arthritis  Glaucoma (increased eye pressure)  Hypertension   There is no problem list on file for this patient.  Past Surgical History:  Procedure Laterality Date  CHOLECYSTECTOMY  HYSTERECTOMY    Not on File  Current Outpatient Medications on File Prior to Visit  Medication Sig Dispense Refill  aspirin 81 MG chewable tablet 1 tablet Orally Once a day  cycloSPORINE (RESTASIS) 0.05 % ophthalmic emulsion Apply 1 drop to eye 2 (two) times daily  desloratadine (CLARINEX) 5 mg tablet take 1 tablet orally once a day for allergies  hydroCHLOROthiazide (HYDRODIURIL) 25 MG tablet Take 25 mg by mouth once daily  KLOR-CON M20 20 mEq ER tablet  omeprazole (PRILOSEC) 20 MG DR capsule 1 capsule 30 minutes before morning meal Orally once a day for acid reflux  simvastatin (ZOCOR) 20 MG tablet 1 tablet Orally once a day for cholesterol  spironolactone  (ALDACTONE) 25 MG tablet 1/2 tablet Orally Once a day for blood pressure  travoprost (TRAVATAN Z) 0.004 % Ophth ophthalmic solution Apply 1 drop to eye at bedtime   No current facility-administered medications on file prior to visit.   Family History  Problem Relation Age of Onset  Obesity Mother  High blood pressure (Hypertension) Mother  Hyperlipidemia (Elevated cholesterol) Mother  Coronary Artery Disease (Blocked arteries around heart) Mother  Coronary Artery Disease (Blocked arteries around heart) Father    Social History   Tobacco Use  Smoking Status Never  Smokeless Tobacco Never    Social History   Socioeconomic History  Marital status: Married  Tobacco Use  Smoking status: Never  Smokeless tobacco: Never  Vaping Use  Vaping status: Never Used  Substance and Sexual Activity  Alcohol use: Not Currently  Drug use: Never   Objective:   Vitals:   BP: (!) 149/81  Pulse: 94  Temp: 36.5 C (97.7 F)  SpO2: 98%  Weight: 87.2 kg (192 lb 3.2 oz)  Height: 160 cm (5\' 3" )  PainSc: 0-No pain   Body mass index is 34.05 kg/m.  Physical Exam   She appears well on exam  There is some mild ecchymosis laterally in her left breast from her biopsy. There are no palpable breast masses and the nipple areolar complex is normal.  There is no axillary adenopathy.  Labs, Imaging and Diagnostic Testing: I have reviewed her mammograms, ultrasound, and pathology results  Assessment and Plan:   Diagnoses and all orders for this visit:  Neoplasm of left breast, primary tumor staging category  Tis: ductal carcinoma in situ (DCIS)   I discussed the diagnosis of DCIS in the left breast with the patient and her daughter. I explained the spectrum of breast cancer in detail. We discussed surgery for cancer in the breast. We discussed both breast conservation versus mastectomy. She is interested in breast conservation. We next discussed proceeding with a radioactive seed guided left  breast lumpectomy. I explained the surgical procedure in detail. We discussed the risks which includes but is not limited to bleeding, infection, injury to surrounding structures, the need for further surgery if margins are positive or invasive cancer is found, cardiopulmonary issues with anesthesia, postoperative recovery, etc.

## 2023-06-20 ENCOUNTER — Ambulatory Visit (HOSPITAL_BASED_OUTPATIENT_CLINIC_OR_DEPARTMENT_OTHER): Payer: Medicare Other | Admitting: Anesthesiology

## 2023-06-20 ENCOUNTER — Encounter (HOSPITAL_COMMUNITY): Payer: Self-pay | Admitting: Surgery

## 2023-06-20 ENCOUNTER — Encounter (HOSPITAL_COMMUNITY)
Admission: RE | Disposition: A | Discharge status: Home / Self Care | Payer: Self-pay | Source: Home / Self Care | Attending: Surgery

## 2023-06-20 ENCOUNTER — Ambulatory Visit
Admission: RE | Admit: 2023-06-20 | Discharge: 2023-06-20 | Disposition: A | Discharge status: Home / Self Care | Payer: Medicare Other | Source: Ambulatory Visit | Attending: Surgery | Admitting: Surgery

## 2023-06-20 ENCOUNTER — Ambulatory Visit (HOSPITAL_COMMUNITY)
Admission: RE | Admit: 2023-06-20 | Discharge: 2023-06-20 | Disposition: A | Discharge status: Home / Self Care | Payer: Medicare Other | Attending: Surgery | Admitting: Surgery

## 2023-06-20 ENCOUNTER — Other Ambulatory Visit: Payer: Self-pay

## 2023-06-20 ENCOUNTER — Ambulatory Visit (HOSPITAL_COMMUNITY): Payer: Medicare Other | Admitting: Vascular Surgery

## 2023-06-20 DIAGNOSIS — C50512 Malignant neoplasm of lower-outer quadrant of left female breast: Secondary | ICD-10-CM | POA: Diagnosis not present

## 2023-06-20 DIAGNOSIS — D0512 Intraductal carcinoma in situ of left breast: Secondary | ICD-10-CM

## 2023-06-20 DIAGNOSIS — N641 Fat necrosis of breast: Secondary | ICD-10-CM | POA: Diagnosis not present

## 2023-06-20 DIAGNOSIS — I10 Essential (primary) hypertension: Secondary | ICD-10-CM | POA: Insufficient documentation

## 2023-06-20 DIAGNOSIS — K219 Gastro-esophageal reflux disease without esophagitis: Secondary | ICD-10-CM | POA: Diagnosis not present

## 2023-06-20 DIAGNOSIS — Z170 Estrogen receptor positive status [ER+]: Secondary | ICD-10-CM | POA: Insufficient documentation

## 2023-06-20 DIAGNOSIS — D0511 Intraductal carcinoma in situ of right breast: Secondary | ICD-10-CM | POA: Diagnosis not present

## 2023-06-20 HISTORY — PX: BREAST LUMPECTOMY WITH RADIOACTIVE SEED LOCALIZATION: SHX6424

## 2023-06-20 SURGERY — BREAST LUMPECTOMY WITH RADIOACTIVE SEED LOCALIZATION
Anesthesia: General | Laterality: Left

## 2023-06-20 MED ORDER — CHLORHEXIDINE GLUCONATE 0.12 % MT SOLN
15.0000 mL | Freq: Once | OROMUCOSAL | Status: AC
  Administered 2023-06-20: 15 mL via OROMUCOSAL
  Filled 2023-06-20: qty 15

## 2023-06-20 MED ORDER — CEFAZOLIN SODIUM-DEXTROSE 2-4 GM/100ML-% IV SOLN
2.0000 g | INTRAVENOUS | Status: AC
  Administered 2023-06-20: 2 g via INTRAVENOUS
  Filled 2023-06-20: qty 100

## 2023-06-20 MED ORDER — ENSURE PRE-SURGERY PO LIQD: 296.0000 mL | Freq: Once | ORAL | Status: DC

## 2023-06-20 MED ORDER — LACTATED RINGERS IV SOLN: INTRAVENOUS | Status: DC

## 2023-06-20 MED ORDER — FENTANYL CITRATE (PF) 250 MCG/5ML IJ SOLN
INTRAMUSCULAR | Status: AC
  Filled 2023-06-20: qty 5

## 2023-06-20 MED ORDER — BUPIVACAINE-EPINEPHRINE 0.5% -1:200000 IJ SOLN
INTRAMUSCULAR | Status: DC | PRN
  Administered 2023-06-20: 15 mL

## 2023-06-20 MED ORDER — PROPOFOL 10 MG/ML IV BOLUS
INTRAVENOUS | Status: DC | PRN
  Administered 2023-06-20: 110 mg via INTRAVENOUS

## 2023-06-20 MED ORDER — PROPOFOL 10 MG/ML IV BOLUS
INTRAVENOUS | Status: AC
  Filled 2023-06-20: qty 20

## 2023-06-20 MED ORDER — CHLORHEXIDINE GLUCONATE CLOTH 2 % EX PADS: 6.0000 | MEDICATED_PAD | Freq: Once | CUTANEOUS | Status: DC

## 2023-06-20 MED ORDER — TRAMADOL HCL 50 MG PO TABS: 50.0000 mg | ORAL_TABLET | Freq: Four times a day (QID) | ORAL | 0 refills | Status: DC | PRN

## 2023-06-20 MED ORDER — LIDOCAINE 2% (20 MG/ML) 5 ML SYRINGE
INTRAMUSCULAR | Status: AC
  Filled 2023-06-20: qty 5

## 2023-06-20 MED ORDER — PHENYLEPHRINE 80 MCG/ML (10ML) SYRINGE FOR IV PUSH (FOR BLOOD PRESSURE SUPPORT)
PREFILLED_SYRINGE | INTRAVENOUS | Status: AC
  Filled 2023-06-20: qty 10

## 2023-06-20 MED ORDER — ACETAMINOPHEN 500 MG PO TABS
1000.0000 mg | ORAL_TABLET | ORAL | Status: AC
  Administered 2023-06-20: 1000 mg via ORAL
  Filled 2023-06-20: qty 2

## 2023-06-20 MED ORDER — DEXAMETHASONE SODIUM PHOSPHATE 10 MG/ML IJ SOLN
INTRAMUSCULAR | Status: DC | PRN
  Administered 2023-06-20: 4 mg via INTRAVENOUS

## 2023-06-20 MED ORDER — BUPIVACAINE-EPINEPHRINE (PF) 0.5% -1:200000 IJ SOLN
INTRAMUSCULAR | Status: AC
  Filled 2023-06-20: qty 30

## 2023-06-20 MED ORDER — ONDANSETRON HCL 4 MG/2ML IJ SOLN
INTRAMUSCULAR | Status: DC | PRN
Start: 2023-06-20 — End: 2023-06-20
  Administered 2023-06-20: 4 mg via INTRAVENOUS

## 2023-06-20 MED ORDER — PHENYLEPHRINE 80 MCG/ML (10ML) SYRINGE FOR IV PUSH (FOR BLOOD PRESSURE SUPPORT)
PREFILLED_SYRINGE | INTRAVENOUS | Status: DC | PRN
  Administered 2023-06-20: 80 ug via INTRAVENOUS
  Administered 2023-06-20: 120 ug via INTRAVENOUS
  Administered 2023-06-20 (×2): 40 ug via INTRAVENOUS

## 2023-06-20 MED ORDER — FENTANYL CITRATE (PF) 250 MCG/5ML IJ SOLN
INTRAMUSCULAR | Status: DC | PRN
  Administered 2023-06-20: 50 ug via INTRAVENOUS

## 2023-06-20 MED ORDER — LIDOCAINE 2% (20 MG/ML) 5 ML SYRINGE
INTRAMUSCULAR | Status: DC | PRN
  Administered 2023-06-20: 100 mg via INTRAVENOUS

## 2023-06-20 MED ORDER — ORAL CARE MOUTH RINSE: 15.0000 mL | Freq: Once | OROMUCOSAL | Status: AC

## 2023-06-20 SURGICAL SUPPLY — 35 items
ADH SKN CLS APL DERMABOND .7 (GAUZE/BANDAGES/DRESSINGS) ×1
APL PRP STRL LF DISP 70% ISPRP (MISCELLANEOUS) ×1
APPLIER CLIP 9.375 MED OPEN (MISCELLANEOUS) ×1
APR CLP MED 9.3 20 MLT OPN (MISCELLANEOUS) ×1
BAG COUNTER SPONGE SURGICOUNT (BAG) ×1 IMPLANT
BAG SPNG CNTER NS LX DISP (BAG) ×1
BINDER BREAST LRG (GAUZE/BANDAGES/DRESSINGS) IMPLANT
BINDER BREAST XLRG (GAUZE/BANDAGES/DRESSINGS) IMPLANT
CANISTER SUCT 3000ML PPV (MISCELLANEOUS) ×1 IMPLANT
CHLORAPREP W/TINT 26 (MISCELLANEOUS) ×1 IMPLANT
CLIP APPLIE 9.375 MED OPEN (MISCELLANEOUS) ×1 IMPLANT
COVER PROBE W GEL 5X96 (DRAPES) ×1 IMPLANT
COVER SURGICAL LIGHT HANDLE (MISCELLANEOUS) ×1 IMPLANT
DERMABOND ADVANCED .7 DNX12 (GAUZE/BANDAGES/DRESSINGS) ×1 IMPLANT
DEVICE DUBIN SPECIMEN MAMMOGRA (MISCELLANEOUS) ×1 IMPLANT
DRAPE CHEST BREAST 15X10 FENES (DRAPES) ×1 IMPLANT
ELECT CAUTERY BLADE 6.4 (BLADE) ×1 IMPLANT
ELECT REM PT RETURN 9FT ADLT (ELECTROSURGICAL) ×1
ELECTRODE REM PT RTRN 9FT ADLT (ELECTROSURGICAL) ×1 IMPLANT
GLOVE SURG SIGNA 7.5 PF LTX (GLOVE) ×1 IMPLANT
GOWN STRL REUS W/ TWL LRG LVL3 (GOWN DISPOSABLE) ×1 IMPLANT
GOWN STRL REUS W/ TWL XL LVL3 (GOWN DISPOSABLE) ×1 IMPLANT
GOWN STRL REUS W/TWL LRG LVL3 (GOWN DISPOSABLE) ×1
GOWN STRL REUS W/TWL XL LVL3 (GOWN DISPOSABLE) ×1
KIT BASIN OR (CUSTOM PROCEDURE TRAY) ×1 IMPLANT
KIT MARKER MARGIN INK (KITS) ×1 IMPLANT
NDL HYPO 25GX1X1/2 BEV (NEEDLE) ×1 IMPLANT
NEEDLE HYPO 25GX1X1/2 BEV (NEEDLE) ×1
NS IRRIG 1000ML POUR BTL (IV SOLUTION) IMPLANT
PACK GENERAL/GYN (CUSTOM PROCEDURE TRAY) ×1 IMPLANT
SUT MNCRL AB 4-0 PS2 18 (SUTURE) ×1 IMPLANT
SUT VIC AB 3-0 SH 18 (SUTURE) ×1 IMPLANT
SYR CONTROL 10ML LL (SYRINGE) ×1 IMPLANT
TOWEL GREEN STERILE (TOWEL DISPOSABLE) ×1 IMPLANT
TOWEL GREEN STERILE FF (TOWEL DISPOSABLE) ×1 IMPLANT

## 2023-06-20 NOTE — Op Note (Signed)
LEFT BREAST RADIOACTIVE SEED GUIDED LUMPECTOMY  Procedure Note  Carmen Fischer 06/20/2023   Pre-op Diagnosis: LEFT BREAST DUCTAL CARCINOMA IN SITU     Post-op Diagnosis: same  Procedure(s): LEFT BREAST RADIOACTIVE SEED GUIDED LUMPECTOMY  Surgeon(s): Abigail Miyamoto, MD  Anesthesia: General  Staff:  Circulator: Lennox Laity, RN Scrub Person: Mechele Dawley, Tiffany  Estimated Blood Loss: Minimal               Specimens: sent to path  Indications: This is an 82 year old female found have calcifications in the left breast on screening mammography.  A biopsy showed ductal carcinoma in situ of the left breast.  The decision was made to proceed with a radioactive seed guided left breast lumpectomy  Procedure: The patient was brought to the operating room and identified the correct patient.  She is placed upon the operating room table and general anesthesia was induced.  Her left breast was prepped and draped in usual sterile fashion.  Using the neoprobe I located the radioactive seed in the lower outer quadrant of the left breast near the chest wall.  I anesthetized the skin over the area with Marcaine and made a longitudinal incision with a scalpel.  I then dissected down to the breast tissue with the electrocautery.  With the aid of the neoprobe I then dissected down circumferentially with electrocautery going all way down to the chest wall.  I then completed the lumpectomy staying underneath the signal from the radioactive seed removing the tissue from the chest wall.  Once I completed the lumpectomy I marked all margins with paint.  An x-ray was performed and the specimen confirmed the radioactive seed and previous biopsy clip were in the specimen.  The lumpectomy specimen was then sent to pathology for evaluation.  I achieved hemostasis with cautery.  I placed surgical clips in the breast around the periphery of the lumpectomy cavity for marking purposes.  I then  closed the deep tissue with a single 3-0 Vicryl suture.  I then closed the subcutaneous tissue with interrupted 3-0 Vicryl sutures and closed skin with a running 4-0 Monocryl.  Dermabond was then applied.  The patient was then placed in a breast binder.  She tolerated the procedure well.  All the counts were correct at the end of the procedure.  She was then extubated in the operating room and taken in a stable condition to the recovery room.          Abigail Miyamoto   Date: 06/20/2023  Time: 7:59 AM

## 2023-06-20 NOTE — Anesthesia Procedure Notes (Signed)
Procedure Name: LMA Insertion Date/Time: 06/20/2023 7:24 AM  Performed by: Rachel Moulds, CRNAPre-anesthesia Checklist: Patient identified, Emergency Drugs available, Suction available, Patient being monitored and Timeout performed Patient Re-evaluated:Patient Re-evaluated prior to induction Oxygen Delivery Method: Circle system utilized Preoxygenation: Pre-oxygenation with 100% oxygen Induction Type: IV induction Ventilation: Mask ventilation without difficulty LMA: LMA inserted LMA Size: 4.0 Number of attempts: 1 Placement Confirmation: positive ETCO2, CO2 detector and breath sounds checked- equal and bilateral Tube secured with: Tape Dental Injury: Teeth and Oropharynx as per pre-operative assessment

## 2023-06-20 NOTE — Anesthesia Postprocedure Evaluation (Signed)
Anesthesia Post Note  Patient: Carmen Fischer  Procedure(s) Performed: LEFT BREAST RADIOACTIVE SEED GUIDED LUMPECTOMY (Left)     Patient location during evaluation: PACU Anesthesia Type: General Level of consciousness: awake and alert Pain management: pain level controlled Vital Signs Assessment: post-procedure vital signs reviewed and stable Respiratory status: spontaneous breathing, nonlabored ventilation, respiratory function stable and patient connected to nasal cannula oxygen Cardiovascular status: blood pressure returned to baseline and stable Postop Assessment: no apparent nausea or vomiting Anesthetic complications: no   No notable events documented.  Last Vitals:  Vitals:   06/20/23 0830 06/20/23 0845  BP: 135/74 (!) 141/76  Pulse: 61 61  Resp: 10 12  Temp:  36.5 C  SpO2: 96% 95%    Last Pain:  Vitals:   06/20/23 0805  PainSc: 0-No pain                 Mariann Barter

## 2023-06-20 NOTE — Discharge Instructions (Signed)
[facility] Office Phone Number [phone]  BREAST BIOPSY/ PARTIAL MASTECTOMY: POST OP INSTRUCTIONS  Always review your discharge instruction sheet given to you by the facility where your surgery was performed.  IF YOU HAVE DISABILITY OR FAMILY LEAVE FORMS, YOU MUST BRING THEM TO THE OFFICE FOR PROCESSING.  DO NOT GIVE THEM TO YOUR DOCTOR.  A prescription for pain medication may be given to you upon discharge.  Take your pain medication as prescribed, if needed.  If narcotic pain medicine is not needed, then you may take acetaminophen (Tylenol) or ibuprofen (Advil) as needed. Take your usually prescribed medications unless otherwise directed If you need a refill on your pain medication, please contact your pharmacy.  They will contact our office to request authorization.  Prescriptions will not be filled after 5pm or on week-ends. You should eat very light the first 24 hours after surgery, such as soup, crackers, pudding, etc.  Resume your normal diet the day after surgery. Most patients will experience some swelling and bruising in the breast.  Ice packs and a good support bra will help.  Swelling and bruising can take several days to resolve.  It is common to experience some constipation if taking pain medication after surgery.  Increasing fluid intake and taking a stool softener will usually help or prevent this problem from occurring.  A mild laxative (Milk of Magnesia or Miralax) should be taken according to package directions if there are no bowel movements after 48 hours. Unless discharge instructions indicate otherwise, you may remove your bandages 24-48 hours after surgery, and you may shower at that time.  You may have steri-strips (small skin tapes) in place directly over the incision.  These strips should be left on the skin for 7-10 days.  If your surgeon used skin glue on the incision, you may shower in 24 hours.  The glue will flake off over the next 2-3 weeks.  Any  sutures or staples will be removed at the office during your follow-up visit. ACTIVITIES:  You may resume regular daily activities (gradually increasing) beginning the next day.  Wearing a good support bra or sports bra minimizes pain and swelling.  You may have sexual intercourse when it is comfortable. You may drive when you no longer are taking prescription pain medication, you can comfortably wear a seatbelt, and you can safely maneuver your car and apply brakes. RETURN TO WORK:  ______________________________________________________________________________________ Carmen Fischer should see your doctor in the office for a follow-up appointment approximately two weeks after your surgery.  Your doctor's nurse will typically make your follow-up appointment when she calls you with your pathology report.  Expect your pathology report 2-3 business days after your surgery.  You may call to check if you do not hear from [place] after three days. OTHER INSTRUCTIONS: you may remove the binder and shower starting tomorrow and then wear what ever makes you the most comfortable Ice pack, tylenol, and ibuprofen also for pain No vigorous activity for one week_______________________________________________________________________________________________ _____________________________________________________________________________________________________________________________________ _____________________________________________________________________________________________________________________________________ _____________________________________________________________________________________________________________________________________  WHEN TO CALL YOUR DOCTOR: Fever over 101.0 Nausea and/or vomiting. Extreme swelling or bruising. Continued bleeding from incision. Increased pain, redness, or drainage from the incision.  The clinic staff is available to answer your questions during regular business hours.  Please  don't hesitate to call and ask to speak to one of the nurses for clinical concerns.  If you have a medical emergency, go to the nearest emergency room or call 911.  A surgeon from [facility]  is always on call at the hospital.  For further questions, please visit [email or web address]

## 2023-06-20 NOTE — Interval H&P Note (Signed)
History and Physical Interval Note: no change in H and P  06/20/2023 7:15 AM  Carmen Fischer  has presented today for surgery, with the diagnosis of LEFT BREAST DUCTAL CARCINOMA IN SITU.  The various methods of treatment have been discussed with the patient and family. After consideration of risks, benefits and other options for treatment, the patient has consented to  Procedure(s) with comments: LEFT BREAST RADIOACTIVE SEED GUIDED LUMPECTOMY (Left) - LMA as a surgical intervention.  The patient's history has been reviewed, patient examined, no change in status, stable for surgery.  I have reviewed the patient's chart and labs.  Questions were answered to the patient's satisfaction.     Abigail Miyamoto

## 2023-06-20 NOTE — Transfer of Care (Signed)
Immediate Anesthesia Transfer of Care Note  Patient: Carmen Fischer  Procedure(s) Performed: LEFT BREAST RADIOACTIVE SEED GUIDED LUMPECTOMY (Left)  Patient Location: PACU  Anesthesia Type:General  Level of Consciousness: awake, alert , and oriented  Airway & Oxygen Therapy: Patient Spontanous Breathing and Patient connected to nasal cannula oxygen  Post-op Assessment: Report given to RN and Post -op Vital signs reviewed and stable  Post vital signs: Reviewed and stable  Last Vitals:  Vitals Value Taken Time  BP 168/76 06/20/23 0805  Temp    Pulse 94 06/20/23 0805  Resp 24 06/20/23 0805  SpO2 92 % 06/20/23 0805  Vitals shown include unfiled device data.  Last Pain:  Vitals:   06/20/23 0605  PainSc: 0-No pain         Complications: No notable events documented.

## 2023-06-21 ENCOUNTER — Encounter (HOSPITAL_COMMUNITY): Payer: Self-pay | Admitting: Surgery

## 2023-06-27 ENCOUNTER — Telehealth: Payer: Self-pay | Admitting: *Deleted

## 2023-06-27 DIAGNOSIS — D0512 Intraductal carcinoma in situ of left breast: Secondary | ICD-10-CM

## 2023-06-27 NOTE — Telephone Encounter (Signed)
Received order for prognostic panel to be ordered on surgical specimen. Msg sent to pathology.

## 2023-06-29 ENCOUNTER — Encounter: Payer: Self-pay | Admitting: *Deleted

## 2023-07-04 ENCOUNTER — Encounter: Payer: Self-pay | Admitting: *Deleted

## 2023-07-10 NOTE — Progress Notes (Incomplete)
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   HISTORY OF PRESENT ILLNESS: Carmen Fischer is a 82 y.o. female with a left breast cancer. She originally presented with calcifications on her screening mammogram. Diagnostic mammogram showed calcifications that span 1.2 cm. Accordingly, patient proceeded with a biopsy of the left breast calcifications on 05/03/23. Surgical pathology revealed high grade ductal carcinoma in situ (DCIS) with necrosis and calcifications and her cancer was ER positive, PR negative.   She underwent a lumpectomy on 06/20/23 that showed a grade 2 focus of microinvasive ductal carcinoma with associated high grade DCIS. Her tumor's prognostics were retested and showed triple negative findings. She's seen to discuss adjuvant radiotherapy. She met with Dr. Al Pimple this morning as well ***    PREVIOUS RADIATION THERAPY: No   PAST MEDICAL HISTORY:  Past Medical History:  Diagnosis Date   Arthritis    Breast cancer (HCC)    stage 0 per pt   GERD (gastroesophageal reflux disease)    Glaucoma    High cholesterol    Hypertension        PAST SURGICAL HISTORY: Past Surgical History:  Procedure Laterality Date   ABDOMINAL HYSTERECTOMY     BREAST BIOPSY Left 05/03/2023   MM LT BREAST BX W LOC DEV 1ST LESION IMAGE BX SPEC STEREO GUIDE 05/03/2023 [facility]   BREAST BIOPSY  06/19/2023   MM LT RADIOACTIVE SEED LOC MAMMO GUIDE 06/19/2023 [facility]   BREAST LUMPECTOMY WITH RADIOACTIVE SEED LOCALIZATION Left 06/20/2023   Procedure: LEFT BREAST RADIOACTIVE SEED GUIDED LUMPECTOMY;  Surgeon: Abigail Miyamoto, MD;  Location: [facility];  Service:  General;  Laterality: Left;  LMA   CHOLECYSTECTOMY     LEFT HEART CATH AND CORONARY ANGIOGRAPHY N/A 03/10/2020   Procedure: LEFT HEART CATH AND CORONARY ANGIOGRAPHY;  Surgeon: Swazil[place], Peter M, MD;  Location: [facility];  Service: Cardiovascular;  Laterality: N/A;   TUBAL LIGATION  1974     FAMILY HISTORY:  Family History  Problem Relation Age of Onset   Heart attack Mother    Heart attack Father    Endometriosis Daughter      SOCIAL HISTORY:  reports that she has never smoked. She has never used smokeless tobacco. She reports that she does not currently use alcohol. She reports that she does not use drugs. She moved to [place] from [place] when she retired. Her daughter lives in [place] and helps with her mother's care.    ALLERGIES: Shellfish allergy and Prednisone   MEDICATIONS:  Current Outpatient Medications  Medication Sig Dispense Refill   acetaminophen (TYLENOL) 500 MG tablet Take 500-1,000 mg by mouth every 6 (six) hours as needed (pain.).     aspirin EC 81 MG tablet Take 81 mg by mouth daily. (Patient not taking: Reported on 06/09/2023)     cholecalciferol (VITAMIN D3) 25 MCG (1000 UNIT) tablet Take 1,000 Units by mouth in the morning.     cycloSPORINE (RESTASIS) 0.05 % ophthalmic emulsion Place 1 drop into both eyes 2 (two) times daily.     desloratadine (CLARINEX) 5 MG tablet Take 5 mg by mouth in the morning.  hydrochlorothiazide (HYDRODIURIL) 25 MG tablet Take 25 mg by mouth in the morning.     KLOR-CON M20 20 MEQ tablet Take 20 mEq by mouth daily.     Magnesium Bisglycinate (MAG GLYCINATE PO) Take 240 mg by mouth at bedtime.     Multiple Vitamin (MULTIVITAMIN WITH MINERALS) TABS tablet Take 1 tablet by mouth daily.     omeprazole (PRILOSEC) 20 MG capsule Take 20 mg by mouth at bedtime.     OVER THE COUNTER MEDICATION Take 1 capsule by mouth in the morning and at bedtime. HydroEye Vitamin (proprietary blend of omega fatty acids (EPA, DHA & GLA)      polyethylene glycol (MIRALAX / GLYCOLAX) 17 g packet Take 17 g by mouth daily as needed for moderate constipation.      simvastatin (ZOCOR) 20 MG tablet Take 20 mg by mouth at bedtime.     spironolactone (ALDACTONE) 25 MG tablet Take 0.5 tablets (12.5 mg total) by mouth daily. 45 tablet 3   traMADol (ULTRAM) 50 MG tablet Take 1 tablet (50 mg total) by mouth every 6 (six) hours as needed for moderate pain or severe pain. 15 tablet 0   Travoprost, BAK Free, (TRAVATAN) 0.004 % SOLN ophthalmic solution Place 1 drop into both eyes at bedtime.     vitamin E 180 MG (400 UNITS) capsule Take 400 Units by mouth in the morning.     No current facility-administered medications for this visit.     REVIEW OF SYSTEMS: On review of systems, the patient reports that ***     PHYSICAL EXAM:  Wt Readings from Last 3 Encounters:  06/20/23 193 lb (87.5 kg)  06/13/23 193 lb 4.8 oz (87.7 kg)  06/09/23 194 lb 6 oz (88.2 kg)   Temp Readings from Last 3 Encounters:  06/20/23 97.7 F (36.5 C)  06/13/23 97.9 F (36.6 C)  06/09/23 (!) 97.1 F (36.2 C) (Temporal)   BP Readings from Last 3 Encounters:  06/20/23 (!) 141/76  06/13/23 (!) 148/89  06/09/23 133/72   Pulse Readings from Last 3 Encounters:  06/20/23 61  06/13/23 70  06/09/23 73    /10  In general this is a well appearing female in no acute distress. She's alert and oriented x4 and appropriate throughout the examination. Cardiopulmonary assessment is negative for acute distress and she exhibits normal effort. Her left breast reveals a well healed surgical incision site without erythema, separation, or drainage.    ECOG = 0  0 - Asymptomatic (Fully active, able to carry on all predisease activities without restriction)  1 - Symptomatic but completely ambulatory (Restricted in physically strenuous activity but ambulatory and able to carry out work of a light or sedentary nature. For example, light housework, office work)  2 - Symptomatic,  <50% in bed during the day (Ambulatory and capable of all self care but unable to carry out any work activities. Up and about more than 50% of waking hours)  3 - Symptomatic, >50% in bed, but not bedbound (Capable of only limited self-care, confined to bed or chair 50% or more of waking hours)  4 - Bedbound (Completely disabled. Cannot carry on any self-care. Totally confined to bed or chair)  5 - Death   Santiago Glad MM, Creech RH, Tormey DC, et al. [phone]). "Toxicity and response criteria of the [facility] [employer]". Am. Evlyn Clines. Oncol. 5 (6): 649-55    LABORATORY DATA:  Lab Results  Component Value Date   WBC 5.1 05/25/2023  HGB 12.9 05/25/2023   HCT 38.9 05/25/2023   MCV 83.5 05/25/2023   PLT 255 05/25/2023   Lab Results  Component Value Date   NA 135 05/25/2023   K 3.7 05/25/2023   CL 97 (L) 05/25/2023   CO2 28 05/25/2023   Lab Results  Component Value Date   ALT 18 05/25/2023   AST 26 05/25/2023   ALKPHOS 59 05/25/2023   BILITOT 0.6 05/25/2023      RADIOGRAPHY: MM Breast Surgical Specimen  Result Date: 06/20/2023 CLINICAL DATA:  Status post seed localized lumpectomy of the LEFT breast EXAM: SPECIMEN RADIOGRAPH OF THE LEFT BREAST COMPARISON:  Previous exam(s). FINDINGS: Status post excision of the left breast. The radioactive seed and X biopsy marker clip are present in the specimen. The findings are discussed with the operating room nurse. IMPRESSION: Specimen radiograph of the left breast. Electronically Signed   By: Norva Pavlov M.D.   On: 06/20/2023 08:37   MM LT RADIOACTIVE SEED LOC MAMMO GUIDE  Result Date: 06/19/2023 CLINICAL DATA:  82 year old female presenting for seed localization of the left breast. Patient has recently diagnosed left breast DCIS. EXAM: MAMMOGRAPHIC GUIDED RADIOACTIVE SEED LOCALIZATION OF THE LEFT BREAST COMPARISON:  Previous exam(s). FINDINGS: Patient presents for radioactive seed localization prior to left breast lumpectomy. I  met with the patient and we discussed the procedure of seed localization including benefits and alternatives. We discussed the high likelihood of a successful procedure. We discussed the risks of the procedure including infection, bleeding, tissue injury and further surgery. We discussed the low dose of radioactivity involved in the procedure. Informed, written consent was given. The usual time-out protocol was performed immediately prior to the procedure. Using mammographic guidance, sterile technique, 1% lidocaine and an I-125 radioactive seed, the calcifications and X biopsy marking clip in the left breast was localized using a lateral approach. The follow-up mammogram images confirm the seed in the expected location and were marked for Dr. Magnus Ivan. Follow-up survey of the patient confirms presence of the radioactive seed. Order number of I-125 seed:  [id number]. Total activity: 0.232 mCi reference Date: May 09, 2023 The patient tolerated the procedure well and was [facility]. She was given instructions regarding seed removal. IMPRESSION: Radioactive seed localization left breast. No apparent complications. Electronically Signed   By: Emmaline Kluver M.D.   On: 06/19/2023 16:28       IMPRESSION/PLAN: 1.  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 Dr. Mitzi Hansen has reviewed the final pathology findings and today she and I reviewed the nature of malignant breast disease. She has done well since surgery. Dr. Al Pimple *** Dr. Mitzi Hansen recommends external radiotherapy to the breast  to reduce risks of local recurrence.  We discussed the risks, benefits, short, and long term effects of radiotherapy, as well as the curative intent, and the patient is interested in proceeding. Dr. Mitzi Hansen discusses the delivery and logistics of radiotherapy and anticipates a course of 4 weeks of radiation versus ultrahypofractionated   radiotherapy. Written consent is obtained and placed in the chart, a copy was provided to the patient. She will simulate on ***  In a visit lasting *** minutes, greater than 50% of the time was spent face to face discussing the patient's condition, in preparation for the discussion, and coordinating the patient's care.      Osker Mason, [facility]   **Disclaimer: This note was dictated with  voice recognition software. Similar sounding words can inadvertently be transcribed and this note may contain transcription errors which may not have been corrected upon publication of note.**

## 2023-07-12 ENCOUNTER — Other Ambulatory Visit: Payer: Self-pay | Admitting: *Deleted

## 2023-07-12 DIAGNOSIS — D0512 Intraductal carcinoma in situ of left breast: Secondary | ICD-10-CM

## 2023-07-13 ENCOUNTER — Inpatient Hospital Stay (HOSPITAL_BASED_OUTPATIENT_CLINIC_OR_DEPARTMENT_OTHER): Payer: Medicare Other | Admitting: Hematology and Oncology

## 2023-07-13 ENCOUNTER — Inpatient Hospital Stay: Payer: Medicare Other | Attending: Hematology and Oncology

## 2023-07-13 ENCOUNTER — Ambulatory Visit
Admission: RE | Admit: 2023-07-13 | Discharge: 2023-07-13 | Disposition: A | Discharge status: Home / Self Care | Payer: Medicare Other | Source: Ambulatory Visit | Attending: Radiation Oncology | Admitting: Radiation Oncology

## 2023-07-13 ENCOUNTER — Encounter: Payer: Self-pay | Admitting: Radiation Oncology

## 2023-07-13 VITALS — BP 137/73 | HR 73 | Temp 98.2°F | Resp 18 | Ht 63.0 in | Wt 194.9 lb

## 2023-07-13 VITALS — BP 137/73 | HR 73 | Temp 98.2°F | Resp 18 | Ht 63.0 in | Wt 194.0 lb

## 2023-07-13 DIAGNOSIS — Z7982 Long term (current) use of aspirin: Secondary | ICD-10-CM | POA: Insufficient documentation

## 2023-07-13 DIAGNOSIS — Z510 Encounter for antineoplastic radiation therapy: Secondary | ICD-10-CM | POA: Insufficient documentation

## 2023-07-13 DIAGNOSIS — D0512 Intraductal carcinoma in situ of left breast: Secondary | ICD-10-CM | POA: Insufficient documentation

## 2023-07-13 DIAGNOSIS — E7800 Pure hypercholesterolemia, unspecified: Secondary | ICD-10-CM | POA: Insufficient documentation

## 2023-07-13 DIAGNOSIS — Z170 Estrogen receptor positive status [ER+]: Secondary | ICD-10-CM | POA: Insufficient documentation

## 2023-07-13 DIAGNOSIS — C50912 Malignant neoplasm of unspecified site of left female breast: Secondary | ICD-10-CM

## 2023-07-13 DIAGNOSIS — Z79899 Other long term (current) drug therapy: Secondary | ICD-10-CM | POA: Insufficient documentation

## 2023-07-13 DIAGNOSIS — I10 Essential (primary) hypertension: Secondary | ICD-10-CM | POA: Insufficient documentation

## 2023-07-13 DIAGNOSIS — K219 Gastro-esophageal reflux disease without esophagitis: Secondary | ICD-10-CM | POA: Insufficient documentation

## 2023-07-13 LAB — CMP (CANCER CENTER ONLY)
ALT: 15 U/L (ref 0–44)
AST: 20 U/L (ref 15–41)
Albumin: 4.2 g/dL (ref 3.5–5.0)
Alkaline Phosphatase: 66 U/L (ref 38–126)
Anion gap: 8 (ref 5–15)
BUN: 11 mg/dL (ref 8–23)
CO2: 29 mmol/L (ref 22–32)
Calcium: 9.9 mg/dL (ref 8.9–10.3)
Chloride: 104 mmol/L (ref 98–111)
Creatinine: 0.96 mg/dL (ref 0.44–1.00)
GFR, Estimated: 58 mL/min — ABNORMAL LOW (ref >60)
Glucose, Bld: 106 mg/dL — ABNORMAL HIGH (ref 70–99)
Potassium: 3.6 mmol/L (ref 3.5–5.1)
Sodium: 141 mmol/L (ref 135–145)
Total Bilirubin: 0.4 mg/dL (ref 0.3–1.2)
Total Protein: 7.5 g/dL (ref 6.5–8.1)

## 2023-07-13 LAB — CBC WITH DIFFERENTIAL (CANCER CENTER ONLY)
Abs Immature Granulocytes: 0 10*3/uL (ref 0.00–0.07)
Basophils Absolute: 0 10*3/uL (ref 0.0–0.1)
Basophils Relative: 1 %
Eosinophils Absolute: 0.1 10*3/uL (ref 0.0–0.5)
Eosinophils Relative: 3 %
HCT: 41.4 % (ref 36.0–46.0)
Hemoglobin: 13.5 g/dL (ref 12.0–15.0)
Immature Granulocytes: 0 %
Lymphocytes Relative: 26 %
Lymphs Abs: 1.1 10*3/uL (ref 0.7–4.0)
MCH: 27.4 pg (ref 26.0–34.0)
MCHC: 32.6 g/dL (ref 30.0–36.0)
MCV: 84.1 fL (ref 80.0–100.0)
Monocytes Absolute: 0.4 10*3/uL (ref 0.1–1.0)
Monocytes Relative: 8 %
Neutro Abs: 2.7 10*3/uL (ref 1.7–7.7)
Neutrophils Relative %: 62 %
Platelet Count: 272 10*3/uL (ref 150–400)
RBC: 4.92 MIL/uL (ref 3.87–5.11)
RDW: 14.5 % (ref 11.5–15.5)
WBC Count: 4.3 10*3/uL (ref 4.0–10.5)
nRBC: 0 % (ref 0.0–0.2)

## 2023-07-13 NOTE — Progress Notes (Signed)
Carmen Fischer  Patient Care Team: Blair Heys, MD as PCP - General (Family Medicine) Lars Masson, MD as PCP - Cardiology (Cardiology) Pershing Proud, RN as Oncology Nurse Navigator Donnelly Angelica, RN as Oncology Nurse Navigator Rachel Moulds, MD as Consulting Physician (Hematology and Oncology)  CHIEF COMPLAINTS/PURPOSE OF CONSULTATION:  Newly diagnosed breast cancer  HISTORY OF PRESENTING ILLNESS:  Carmen Fischer 82 y.o. female is here because of recent diagnosis of left breast DCIS  I reviewed her records extensively and collaborated the history with the patient.  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  Diagnostic mammogram confirmed left breast calcifications   05/03/2023 Pathology Results   Left breast biopsy showed DCIS, high-grade, solid and cribriform types, necrosis and calcs present, ER 80% positive weak staining, PR 0% negative   05/24/2023 Initial Diagnosis   DCIS (ductal carcinoma in situ) of breast    Patient arrived to the appointment today by herself. Since last visit she had surgery, extensive DCIS, an area of microinvasion noted. Microinvasive tumor measured 0.5 mm and is triple neg. Rest of the pertinent 10 point ROS reviewed and negative  MEDICAL HISTORY:  Past Medical History:  Diagnosis Date   Arthritis    Breast cancer (HCC)    stage 0 per pt   GERD (gastroesophageal reflux disease)    Glaucoma    High cholesterol    Hypertension     SURGICAL HISTORY: Past Surgical History:  Procedure Laterality Date   ABDOMINAL HYSTERECTOMY     BREAST BIOPSY Left 05/03/2023   MM LT BREAST BX W LOC DEV 1ST LESION IMAGE BX SPEC STEREO GUIDE 05/03/2023 [facility]   BREAST BIOPSY  06/19/2023   MM LT RADIOACTIVE SEED  LOC MAMMO GUIDE 06/19/2023 [facility]   BREAST LUMPECTOMY WITH RADIOACTIVE SEED LOCALIZATION Left 06/20/2023   Procedure: LEFT BREAST RADIOACTIVE SEED GUIDED LUMPECTOMY;  Surgeon: Abigail Miyamoto, MD;  Location: [facility];  Service: General;  Laterality: Left;  LMA   CHOLECYSTECTOMY     LEFT HEART CATH AND CORONARY ANGIOGRAPHY N/A 03/10/2020   Procedure: LEFT HEART CATH AND CORONARY ANGIOGRAPHY;  Surgeon: Swazil[place], Peter M, MD;  [facility]: [facility];  Service: Cardiovascular;  Laterality: N/A;   TUBAL LIGATION  1974    SOCIAL HISTORY: Social History   Socioeconomic History   Marital status: Married    Spouse name: Not on file   Number of children: 2   Years of education: Not on file   Highest education level: Not on file  Occupational History   Not on file  Tobacco Use   Smoking status: Never   Smokeless tobacco: Never  Vaping Use   Vaping status: Never Used  Substance and Sexual Activity   Alcohol use: Not Currently   Drug use: Never   Sexual activity: Not on file  Other Topics Concern   Not on file  Social History Narrative   Not on file   Social Determinants of Health   Financial Resource Strain: Not on file  Food Insecurity: No Food Insecurity (06/09/2023)   Hunger Vital Sign    Worried About Running Out of Food in the Last Year: Never true    Ran Out of Food in the Last Year: Never true  Transportation Needs: No Transportation Needs (06/09/2023)   PRAPARE -  Administrator, Civil Service (Medical): No    Lack of Transportation (Non-Medical): No  Physical Activity: Not on file  Stress: Not on file  Social Connections: Not on file  Intimate Partner Violence: Not At Risk (06/09/2023)   Humiliation, Afraid, Rape, and Kick questionnaire    Fear of Current or Ex-Partner: No    Emotionally Abused: No    Physically Abused: No    Sexually Abused: No    FAMILY HISTORY: Family History  Problem Relation Age of Onset   Heart attack Mother    Heart  attack Father    Endometriosis Daughter     ALLERGIES:  is allergic to shellfish allergy and prednisone.  MEDICATIONS:  Current Outpatient Medications  Medication Sig Dispense Refill   acetaminophen (TYLENOL) 500 MG tablet Take 500-1,000 mg by mouth every 6 (six) hours as needed (pain.).     aspirin EC 81 MG tablet Take 81 mg by mouth daily. (Patient not taking: Reported on 06/09/2023)     cholecalciferol (VITAMIN D3) 25 MCG (1000 UNIT) tablet Take 1,000 Units by mouth in the morning.     cycloSPORINE (RESTASIS) 0.05 % ophthalmic emulsion Place 1 drop into both eyes 2 (two) times daily.     desloratadine (CLARINEX) 5 MG tablet Take 5 mg by mouth in the morning.     hydrochlorothiazide (HYDRODIURIL) 25 MG tablet Take 25 mg by mouth in the morning.     KLOR-CON M20 20 MEQ tablet Take 20 mEq by mouth daily.     Magnesium Bisglycinate (MAG GLYCINATE PO) Take 240 mg by mouth at bedtime.     Multiple Vitamin (MULTIVITAMIN WITH MINERALS) TABS tablet Take 1 tablet by mouth daily.     omeprazole (PRILOSEC) 20 MG capsule Take 20 mg by mouth at bedtime.     OVER THE COUNTER MEDICATION Take 1 capsule by mouth in the morning and at bedtime. HydroEye Vitamin (proprietary blend of omega fatty acids (EPA, DHA & GLA)     polyethylene glycol (MIRALAX / GLYCOLAX) 17 g packet Take 17 g by mouth daily as needed for moderate constipation.      simvastatin (ZOCOR) 20 MG tablet Take 20 mg by mouth at bedtime.     spironolactone (ALDACTONE) 25 MG tablet Take 0.5 tablets (12.5 mg total) by mouth daily. 45 tablet 3   traMADol (ULTRAM) 50 MG tablet Take 1 tablet (50 mg total) by mouth every 6 (six) hours as needed for moderate pain or severe pain. 15 tablet 0   Travoprost, BAK Free, (TRAVATAN) 0.004 % SOLN ophthalmic solution Place 1 drop into both eyes at bedtime.     vitamin E 180 MG (400 UNITS) capsule Take 400 Units by mouth in the morning.     No current facility-administered medications for this visit.     REVIEW OF SYSTEMS:   Constitutional: Denies fevers, chills or abnormal night sweats Eyes: Denies blurriness of vision, double vision or watery eyes Ears, nose, mouth, throat, and face: Denies mucositis or sore throat Respiratory: Denies cough, dyspnea or wheezes Cardiovascular: Denies palpitation, chest discomfort or lower extremity swelling Gastrointestinal:  Denies nausea, heartburn or change in bowel habits Skin: Denies abnormal skin rashes Lymphatics: Denies new lymphadenopathy or easy bruising Neurological:Denies numbness, tingling or new weaknesses Behavioral/Psych: Mood is stable, no new changes  Breast: Denies any palpable lumps or discharge All other systems were reviewed with the patient and are negative.  PHYSICAL EXAMINATION: ECOG PERFORMANCE STATUS: 0 - Asymptomatic  Vitals:  07/13/23 0841  BP: 137/73  Pulse: 73  Resp: 18  Temp: 98.2 F (36.8 C)  SpO2: 96%   Filed Weights   07/13/23 0841  Weight: 194 lb 14.4 oz (88.4 kg)    GENERAL:alert, no distress and comfortable   LABORATORY DATA:  I have reviewed the data as listed Lab Results  Component Value Date   WBC 4.3 07/13/2023   HGB 13.5 07/13/2023   HCT 41.4 07/13/2023   MCV 84.1 07/13/2023   PLT 272 07/13/2023   Lab Results  Component Value Date   NA 135 05/25/2023   K 3.7 05/25/2023   CL 97 (L) 05/25/2023   CO2 28 05/25/2023    RADIOGRAPHIC STUDIES: I have personally reviewed the radiological reports and agreed with the findings in the report.  ASSESSMENT AND PLAN:  Ductal carcinoma in situ (DCIS) of breast with microinvasive component [facility]) This is a very pleasant 82 year old postmenopausal female patient with left breast ER weakly positive, PR negative DCIS referred to medical oncology for additional recommendations.  We have discussed the following details about DCIS today. She is s.p lumpectomy, final pathology showed high-grade DCIS as well as some area of microinvasion measuring 0.5  mm.  This microinvasive tumor was deemed triple negative.  Given the tumor being very small, despite being triple negative at the age of 53, I do not see a significant role for adjuvant chemotherapy. She will now proceed with adjuvant radiation.  Since her original DCIS was 80% weak staining, we will attempt antiestrogen therapy after she completes radiation.  We have once again discussed about aromatase inhibitors, mechanism of action, adverse effects today including but not limited to postmenopausal symptoms such as hot flashes, vaginal dryness, arthralgias, bone density loss.  She is willing to give it a try.  She will return to clinic in approximately 8 weeks.    All questions were answered. The patient knows to call the clinic with any problems, questions or concerns.    Rachel Moulds, MD 07/13/23

## 2023-07-13 NOTE — Assessment & Plan Note (Signed)
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  We have discussed the following details about DCIS today. She is s.p lumpectomy, final pathology showed high-grade DCIS as well as some area of microinvasion measuring 0.5 mm.  This microinvasive tumor was deemed triple negative.  Given the tumor being very small, despite being triple negative at the age of 42, I do not see a significant role for adjuvant chemotherapy. She will now proceed with adjuvant radiation.  Since her original DCIS was 80% weak staining, we will attempt antiestrogen therapy after she completes radiation.  We have once again discussed about aromatase inhibitors, mechanism of action, adverse effects today including but not limited to postmenopausal symptoms such as hot flashes, vaginal dryness, arthralgias, bone density loss.  She is willing to give it a try.  She will return to clinic in approximately 8 weeks.

## 2023-07-13 NOTE — Progress Notes (Signed)
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 Patient identity verified x2.  Patient reports doing well. No issues conveyed at this time.  Meaningful use complete.  Vitals- BP 137/73 (BP Location: Left Arm, Patient Position: Sitting, Cuff Size: Large)   Pulse 73   Temp 98.2 F (36.8 C) (Temporal)   Resp 18   Ht 5\' 3"  (1.6 m)   Wt 194 lb (88 kg)   SpO2 96%   BMI 34.37 kg/m    This concludes the interaction.  Ruel Favors, LPN

## 2023-07-19 DIAGNOSIS — Z23 Encounter for immunization: Secondary | ICD-10-CM | POA: Diagnosis not present

## 2023-07-20 ENCOUNTER — Encounter: Payer: Self-pay | Admitting: *Deleted

## 2023-07-20 DIAGNOSIS — C50912 Malignant neoplasm of unspecified site of left female breast: Secondary | ICD-10-CM

## 2023-07-24 ENCOUNTER — Other Ambulatory Visit (HOSPITAL_BASED_OUTPATIENT_CLINIC_OR_DEPARTMENT_OTHER): Payer: Self-pay

## 2023-07-24 DIAGNOSIS — Z23 Encounter for immunization: Secondary | ICD-10-CM | POA: Diagnosis not present

## 2023-07-25 DIAGNOSIS — D0512 Intraductal carcinoma in situ of left breast: Secondary | ICD-10-CM | POA: Diagnosis not present

## 2023-07-25 DIAGNOSIS — Z510 Encounter for antineoplastic radiation therapy: Secondary | ICD-10-CM | POA: Diagnosis not present

## 2023-07-31 ENCOUNTER — Other Ambulatory Visit: Payer: Self-pay

## 2023-07-31 ENCOUNTER — Ambulatory Visit
Admission: RE | Admit: 2023-07-31 | Discharge: 2023-07-31 | Disposition: A | Discharge status: Home / Self Care | Payer: Medicare Other | Source: Ambulatory Visit | Attending: Radiation Oncology | Admitting: Radiation Oncology

## 2023-07-31 DIAGNOSIS — Z510 Encounter for antineoplastic radiation therapy: Secondary | ICD-10-CM | POA: Diagnosis not present

## 2023-07-31 DIAGNOSIS — D0512 Intraductal carcinoma in situ of left breast: Secondary | ICD-10-CM | POA: Diagnosis not present

## 2023-07-31 LAB — RAD ONC ARIA SESSION SUMMARY
Course Elapsed Days: 0
Plan Fractions Treated to Date: 1
Plan Prescribed Dose Per Fraction: 2.66 Gy
Plan Total Fractions Prescribed: 16
Plan Total Prescribed Dose: 42.56 Gy
Reference Point Dosage Given to Date: 2.66 Gy
Reference Point Session Dosage Given: 2.66 Gy
Session Number: 1

## 2023-08-01 ENCOUNTER — Ambulatory Visit
Admission: RE | Admit: 2023-08-01 | Discharge: 2023-08-01 | Disposition: A | Discharge status: Home / Self Care | Payer: Medicare Other | Source: Ambulatory Visit | Attending: Radiation Oncology | Admitting: Radiation Oncology

## 2023-08-01 ENCOUNTER — Other Ambulatory Visit: Payer: Self-pay

## 2023-08-01 DIAGNOSIS — Z510 Encounter for antineoplastic radiation therapy: Secondary | ICD-10-CM | POA: Insufficient documentation

## 2023-08-01 DIAGNOSIS — D0512 Intraductal carcinoma in situ of left breast: Secondary | ICD-10-CM | POA: Diagnosis not present

## 2023-08-01 LAB — RAD ONC ARIA SESSION SUMMARY
Course Elapsed Days: 1
Plan Fractions Treated to Date: 2
Plan Prescribed Dose Per Fraction: 2.66 Gy
Plan Total Fractions Prescribed: 16
Plan Total Prescribed Dose: 42.56 Gy
Reference Point Dosage Given to Date: 5.32 Gy
Reference Point Session Dosage Given: 2.66 Gy
Session Number: 2

## 2023-08-02 ENCOUNTER — Ambulatory Visit
Admission: RE | Admit: 2023-08-02 | Discharge: 2023-08-02 | Disposition: A | Discharge status: Home / Self Care | Payer: Medicare Other | Source: Ambulatory Visit | Attending: Radiation Oncology | Admitting: Radiation Oncology

## 2023-08-02 ENCOUNTER — Other Ambulatory Visit: Payer: Self-pay

## 2023-08-02 DIAGNOSIS — D0512 Intraductal carcinoma in situ of left breast: Secondary | ICD-10-CM | POA: Diagnosis not present

## 2023-08-02 DIAGNOSIS — Z510 Encounter for antineoplastic radiation therapy: Secondary | ICD-10-CM | POA: Diagnosis not present

## 2023-08-02 LAB — RAD ONC ARIA SESSION SUMMARY
Course Elapsed Days: 2
Plan Fractions Treated to Date: 3
Plan Prescribed Dose Per Fraction: 2.66 Gy
Plan Total Fractions Prescribed: 16
Plan Total Prescribed Dose: 42.56 Gy
Reference Point Dosage Given to Date: 7.98 Gy
Reference Point Session Dosage Given: 2.66 Gy
Session Number: 3

## 2023-08-03 ENCOUNTER — Other Ambulatory Visit: Payer: Self-pay

## 2023-08-03 ENCOUNTER — Ambulatory Visit
Admission: RE | Admit: 2023-08-03 | Discharge: 2023-08-03 | Disposition: A | Discharge status: Home / Self Care | Payer: Medicare Other | Source: Ambulatory Visit | Attending: Radiation Oncology | Admitting: Radiation Oncology

## 2023-08-03 DIAGNOSIS — D0512 Intraductal carcinoma in situ of left breast: Secondary | ICD-10-CM | POA: Diagnosis not present

## 2023-08-03 DIAGNOSIS — Z510 Encounter for antineoplastic radiation therapy: Secondary | ICD-10-CM | POA: Diagnosis not present

## 2023-08-03 LAB — RAD ONC ARIA SESSION SUMMARY
Course Elapsed Days: 3
Plan Fractions Treated to Date: 4
Plan Prescribed Dose Per Fraction: 2.66 Gy
Plan Total Fractions Prescribed: 16
Plan Total Prescribed Dose: 42.56 Gy
Reference Point Dosage Given to Date: 10.64 Gy
Reference Point Session Dosage Given: 2.66 Gy
Session Number: 4

## 2023-08-04 ENCOUNTER — Ambulatory Visit
Admission: RE | Admit: 2023-08-04 | Discharge: 2023-08-04 | Disposition: A | Discharge status: Home / Self Care | Payer: Medicare Other | Source: Ambulatory Visit | Attending: Radiation Oncology

## 2023-08-04 ENCOUNTER — Other Ambulatory Visit: Payer: Self-pay

## 2023-08-04 ENCOUNTER — Ambulatory Visit
Admission: RE | Admit: 2023-08-04 | Discharge: 2023-08-04 | Disposition: A | Discharge status: Home / Self Care | Payer: Medicare Other | Source: Ambulatory Visit | Attending: Radiation Oncology | Admitting: Radiation Oncology

## 2023-08-04 DIAGNOSIS — C50912 Malignant neoplasm of unspecified site of left female breast: Secondary | ICD-10-CM

## 2023-08-04 DIAGNOSIS — Z510 Encounter for antineoplastic radiation therapy: Secondary | ICD-10-CM | POA: Diagnosis not present

## 2023-08-04 DIAGNOSIS — D0512 Intraductal carcinoma in situ of left breast: Secondary | ICD-10-CM | POA: Diagnosis not present

## 2023-08-04 LAB — RAD ONC ARIA SESSION SUMMARY
Course Elapsed Days: 4
Plan Fractions Treated to Date: 5
Plan Prescribed Dose Per Fraction: 2.66 Gy
Plan Total Fractions Prescribed: 16
Plan Total Prescribed Dose: 42.56 Gy
Reference Point Dosage Given to Date: 13.3 Gy
Reference Point Session Dosage Given: 2.66 Gy
Session Number: 5

## 2023-08-04 MED ORDER — ALRA NON-METALLIC DEODORANT (RAD-ONC)
1.0000 | Freq: Once | TOPICAL | Status: AC
  Administered 2023-08-04: 1 via TOPICAL

## 2023-08-04 MED ORDER — RADIAPLEXRX EX GEL: Freq: Once | CUTANEOUS | Status: AC

## 2023-08-05 DIAGNOSIS — D0512 Intraductal carcinoma in situ of left breast: Secondary | ICD-10-CM | POA: Diagnosis not present

## 2023-08-05 DIAGNOSIS — Z510 Encounter for antineoplastic radiation therapy: Secondary | ICD-10-CM | POA: Diagnosis not present

## 2023-08-07 ENCOUNTER — Other Ambulatory Visit: Payer: Self-pay

## 2023-08-07 ENCOUNTER — Ambulatory Visit
Admission: RE | Admit: 2023-08-07 | Discharge: 2023-08-07 | Disposition: A | Discharge status: Home / Self Care | Payer: Medicare Other | Source: Ambulatory Visit | Attending: Radiation Oncology

## 2023-08-07 DIAGNOSIS — D0512 Intraductal carcinoma in situ of left breast: Secondary | ICD-10-CM | POA: Diagnosis not present

## 2023-08-07 DIAGNOSIS — Z510 Encounter for antineoplastic radiation therapy: Secondary | ICD-10-CM | POA: Diagnosis not present

## 2023-08-07 LAB — RAD ONC ARIA SESSION SUMMARY
Course Elapsed Days: 7
Plan Fractions Treated to Date: 6
Plan Prescribed Dose Per Fraction: 2.66 Gy
Plan Total Fractions Prescribed: 16
Plan Total Prescribed Dose: 42.56 Gy
Reference Point Dosage Given to Date: 15.96 Gy
Reference Point Session Dosage Given: 2.66 Gy
Session Number: 6

## 2023-08-08 ENCOUNTER — Other Ambulatory Visit: Payer: Self-pay

## 2023-08-08 ENCOUNTER — Ambulatory Visit
Admission: RE | Admit: 2023-08-08 | Discharge: 2023-08-08 | Disposition: A | Discharge status: Home / Self Care | Payer: Medicare Other | Source: Ambulatory Visit | Attending: Radiation Oncology

## 2023-08-08 DIAGNOSIS — Z510 Encounter for antineoplastic radiation therapy: Secondary | ICD-10-CM | POA: Diagnosis not present

## 2023-08-08 DIAGNOSIS — D0512 Intraductal carcinoma in situ of left breast: Secondary | ICD-10-CM | POA: Diagnosis not present

## 2023-08-08 LAB — RAD ONC ARIA SESSION SUMMARY
Course Elapsed Days: 8
Plan Fractions Treated to Date: 7
Plan Prescribed Dose Per Fraction: 2.66 Gy
Plan Total Fractions Prescribed: 16
Plan Total Prescribed Dose: 42.56 Gy
Reference Point Dosage Given to Date: 18.62 Gy
Reference Point Session Dosage Given: 2.66 Gy
Session Number: 7

## 2023-08-09 ENCOUNTER — Other Ambulatory Visit: Payer: Self-pay

## 2023-08-09 ENCOUNTER — Ambulatory Visit
Admission: RE | Admit: 2023-08-09 | Discharge: 2023-08-09 | Disposition: A | Discharge status: Home / Self Care | Payer: Medicare Other | Source: Ambulatory Visit | Attending: Radiation Oncology | Admitting: Radiation Oncology

## 2023-08-09 DIAGNOSIS — D0512 Intraductal carcinoma in situ of left breast: Secondary | ICD-10-CM | POA: Diagnosis not present

## 2023-08-09 DIAGNOSIS — Z510 Encounter for antineoplastic radiation therapy: Secondary | ICD-10-CM | POA: Diagnosis not present

## 2023-08-09 LAB — RAD ONC ARIA SESSION SUMMARY
Course Elapsed Days: 9
Plan Fractions Treated to Date: 8
Plan Prescribed Dose Per Fraction: 2.66 Gy
Plan Total Fractions Prescribed: 16
Plan Total Prescribed Dose: 42.56 Gy
Reference Point Dosage Given to Date: 21.28 Gy
Reference Point Session Dosage Given: 2.66 Gy
Session Number: 8

## 2023-08-10 ENCOUNTER — Other Ambulatory Visit: Payer: Self-pay

## 2023-08-10 ENCOUNTER — Ambulatory Visit
Admission: RE | Admit: 2023-08-10 | Discharge: 2023-08-10 | Disposition: A | Discharge status: Home / Self Care | Payer: Medicare Other | Source: Ambulatory Visit | Attending: Radiation Oncology | Admitting: Radiation Oncology

## 2023-08-10 DIAGNOSIS — D0512 Intraductal carcinoma in situ of left breast: Secondary | ICD-10-CM | POA: Diagnosis not present

## 2023-08-10 DIAGNOSIS — Z510 Encounter for antineoplastic radiation therapy: Secondary | ICD-10-CM | POA: Diagnosis not present

## 2023-08-10 LAB — RAD ONC ARIA SESSION SUMMARY
Course Elapsed Days: 10
Plan Fractions Treated to Date: 9
Plan Prescribed Dose Per Fraction: 2.66 Gy
Plan Total Fractions Prescribed: 16
Plan Total Prescribed Dose: 42.56 Gy
Reference Point Dosage Given to Date: 23.94 Gy
Reference Point Session Dosage Given: 2.66 Gy
Session Number: 9

## 2023-08-11 ENCOUNTER — Ambulatory Visit
Admission: RE | Admit: 2023-08-11 | Discharge: 2023-08-11 | Disposition: A | Discharge status: Home / Self Care | Payer: Medicare Other | Source: Ambulatory Visit | Attending: Radiation Oncology | Admitting: Radiation Oncology

## 2023-08-11 ENCOUNTER — Other Ambulatory Visit: Payer: Self-pay

## 2023-08-11 DIAGNOSIS — C50912 Malignant neoplasm of unspecified site of left female breast: Secondary | ICD-10-CM

## 2023-08-11 DIAGNOSIS — Z510 Encounter for antineoplastic radiation therapy: Secondary | ICD-10-CM | POA: Diagnosis not present

## 2023-08-11 DIAGNOSIS — D0512 Intraductal carcinoma in situ of left breast: Secondary | ICD-10-CM | POA: Diagnosis not present

## 2023-08-11 LAB — RAD ONC ARIA SESSION SUMMARY
Course Elapsed Days: 11
Plan Fractions Treated to Date: 10
Plan Prescribed Dose Per Fraction: 2.66 Gy
Plan Total Fractions Prescribed: 16
Plan Total Prescribed Dose: 42.56 Gy
Reference Point Dosage Given to Date: 26.6 Gy
Reference Point Session Dosage Given: 2.66 Gy
Session Number: 10

## 2023-08-11 MED ORDER — RADIAPLEXRX EX GEL
Freq: Once | CUTANEOUS | Status: AC
  Administered 2023-08-11: 1 via TOPICAL

## 2023-08-14 ENCOUNTER — Ambulatory Visit
Admission: RE | Admit: 2023-08-14 | Discharge: 2023-08-14 | Disposition: A | Discharge status: Home / Self Care | Payer: Medicare Other | Source: Ambulatory Visit | Attending: Radiation Oncology

## 2023-08-14 ENCOUNTER — Other Ambulatory Visit: Payer: Self-pay

## 2023-08-14 DIAGNOSIS — D0512 Intraductal carcinoma in situ of left breast: Secondary | ICD-10-CM | POA: Diagnosis not present

## 2023-08-14 DIAGNOSIS — Z510 Encounter for antineoplastic radiation therapy: Secondary | ICD-10-CM | POA: Diagnosis not present

## 2023-08-14 LAB — RAD ONC ARIA SESSION SUMMARY
Course Elapsed Days: 14
Plan Fractions Treated to Date: 11
Plan Prescribed Dose Per Fraction: 2.66 Gy
Plan Total Fractions Prescribed: 16
Plan Total Prescribed Dose: 42.56 Gy
Reference Point Dosage Given to Date: 29.26 Gy
Reference Point Session Dosage Given: 2.66 Gy
Session Number: 11

## 2023-08-15 ENCOUNTER — Other Ambulatory Visit: Payer: Self-pay

## 2023-08-15 ENCOUNTER — Ambulatory Visit
Admission: RE | Admit: 2023-08-15 | Discharge: 2023-08-15 | Disposition: A | Discharge status: Home / Self Care | Payer: Medicare Other | Source: Ambulatory Visit | Attending: Radiation Oncology

## 2023-08-15 DIAGNOSIS — Z510 Encounter for antineoplastic radiation therapy: Secondary | ICD-10-CM | POA: Diagnosis not present

## 2023-08-15 DIAGNOSIS — D0512 Intraductal carcinoma in situ of left breast: Secondary | ICD-10-CM | POA: Diagnosis not present

## 2023-08-15 LAB — RAD ONC ARIA SESSION SUMMARY
Course Elapsed Days: 15
Plan Fractions Treated to Date: 12
Plan Prescribed Dose Per Fraction: 2.66 Gy
Plan Total Fractions Prescribed: 16
Plan Total Prescribed Dose: 42.56 Gy
Reference Point Dosage Given to Date: 31.92 Gy
Reference Point Session Dosage Given: 2.66 Gy
Session Number: 12

## 2023-08-16 ENCOUNTER — Ambulatory Visit
Admission: RE | Admit: 2023-08-16 | Discharge: 2023-08-16 | Disposition: A | Discharge status: Home / Self Care | Payer: Medicare Other | Source: Ambulatory Visit | Attending: Radiation Oncology | Admitting: Radiation Oncology

## 2023-08-16 ENCOUNTER — Other Ambulatory Visit: Payer: Self-pay

## 2023-08-16 DIAGNOSIS — D0512 Intraductal carcinoma in situ of left breast: Secondary | ICD-10-CM | POA: Diagnosis not present

## 2023-08-16 DIAGNOSIS — Z510 Encounter for antineoplastic radiation therapy: Secondary | ICD-10-CM | POA: Diagnosis not present

## 2023-08-16 LAB — RAD ONC ARIA SESSION SUMMARY
Course Elapsed Days: 16
Plan Fractions Treated to Date: 13
Plan Prescribed Dose Per Fraction: 2.66 Gy
Plan Total Fractions Prescribed: 16
Plan Total Prescribed Dose: 42.56 Gy
Reference Point Dosage Given to Date: 34.58 Gy
Reference Point Session Dosage Given: 2.66 Gy
Session Number: 13

## 2023-08-17 ENCOUNTER — Ambulatory Visit
Admission: RE | Admit: 2023-08-17 | Discharge: 2023-08-17 | Disposition: A | Discharge status: Home / Self Care | Payer: Medicare Other | Source: Ambulatory Visit | Attending: Radiation Oncology | Admitting: Radiation Oncology

## 2023-08-17 ENCOUNTER — Other Ambulatory Visit: Payer: Self-pay

## 2023-08-17 DIAGNOSIS — Z510 Encounter for antineoplastic radiation therapy: Secondary | ICD-10-CM | POA: Diagnosis not present

## 2023-08-17 DIAGNOSIS — D0512 Intraductal carcinoma in situ of left breast: Secondary | ICD-10-CM | POA: Diagnosis not present

## 2023-08-17 LAB — RAD ONC ARIA SESSION SUMMARY
Course Elapsed Days: 17
Plan Fractions Treated to Date: 14
Plan Prescribed Dose Per Fraction: 2.66 Gy
Plan Total Fractions Prescribed: 16
Plan Total Prescribed Dose: 42.56 Gy
Reference Point Dosage Given to Date: 37.24 Gy
Reference Point Session Dosage Given: 2.66 Gy
Session Number: 14

## 2023-08-18 ENCOUNTER — Ambulatory Visit: Payer: Medicare Other | Admitting: Radiation Oncology

## 2023-08-18 ENCOUNTER — Other Ambulatory Visit: Payer: Self-pay

## 2023-08-18 ENCOUNTER — Ambulatory Visit
Admission: RE | Admit: 2023-08-18 | Discharge: 2023-08-18 | Disposition: A | Discharge status: Home / Self Care | Payer: Medicare Other | Source: Ambulatory Visit | Attending: Radiation Oncology | Admitting: Radiation Oncology

## 2023-08-18 DIAGNOSIS — D0512 Intraductal carcinoma in situ of left breast: Secondary | ICD-10-CM | POA: Diagnosis not present

## 2023-08-18 DIAGNOSIS — Z510 Encounter for antineoplastic radiation therapy: Secondary | ICD-10-CM | POA: Diagnosis not present

## 2023-08-18 LAB — RAD ONC ARIA SESSION SUMMARY
Course Elapsed Days: 18
Plan Fractions Treated to Date: 15
Plan Prescribed Dose Per Fraction: 2.66 Gy
Plan Total Fractions Prescribed: 16
Plan Total Prescribed Dose: 42.56 Gy
Reference Point Dosage Given to Date: 39.9 Gy
Reference Point Session Dosage Given: 2.66 Gy
Session Number: 15

## 2023-08-21 ENCOUNTER — Ambulatory Visit
Admission: RE | Admit: 2023-08-21 | Discharge: 2023-08-21 | Disposition: A | Discharge status: Home / Self Care | Payer: Medicare Other | Source: Ambulatory Visit | Attending: Radiation Oncology

## 2023-08-21 ENCOUNTER — Other Ambulatory Visit: Payer: Self-pay

## 2023-08-21 DIAGNOSIS — Z510 Encounter for antineoplastic radiation therapy: Secondary | ICD-10-CM | POA: Diagnosis not present

## 2023-08-21 DIAGNOSIS — D0512 Intraductal carcinoma in situ of left breast: Secondary | ICD-10-CM | POA: Diagnosis not present

## 2023-08-21 LAB — RAD ONC ARIA SESSION SUMMARY
Course Elapsed Days: 21
Plan Fractions Treated to Date: 16
Plan Prescribed Dose Per Fraction: 2.66 Gy
Plan Total Fractions Prescribed: 16
Plan Total Prescribed Dose: 42.56 Gy
Reference Point Dosage Given to Date: 42.56 Gy
Reference Point Session Dosage Given: 2.66 Gy
Session Number: 16

## 2023-08-22 ENCOUNTER — Ambulatory Visit
Admission: RE | Admit: 2023-08-22 | Discharge: 2023-08-22 | Disposition: A | Discharge status: Home / Self Care | Payer: Medicare Other | Source: Ambulatory Visit | Attending: Radiation Oncology | Admitting: Radiation Oncology

## 2023-08-22 ENCOUNTER — Other Ambulatory Visit: Payer: Self-pay

## 2023-08-22 ENCOUNTER — Ambulatory Visit: Payer: Medicare Other | Admitting: Radiation Oncology

## 2023-08-22 DIAGNOSIS — D0512 Intraductal carcinoma in situ of left breast: Secondary | ICD-10-CM | POA: Diagnosis not present

## 2023-08-22 DIAGNOSIS — Z510 Encounter for antineoplastic radiation therapy: Secondary | ICD-10-CM | POA: Diagnosis not present

## 2023-08-22 LAB — RAD ONC ARIA SESSION SUMMARY
Course Elapsed Days: 22
Plan Fractions Treated to Date: 1
Plan Prescribed Dose Per Fraction: 2 Gy
Plan Total Fractions Prescribed: 4
Plan Total Prescribed Dose: 8 Gy
Reference Point Dosage Given to Date: 2 Gy
Reference Point Session Dosage Given: 2 Gy
Session Number: 17

## 2023-08-23 ENCOUNTER — Ambulatory Visit
Admission: RE | Admit: 2023-08-23 | Discharge: 2023-08-23 | Disposition: A | Discharge status: Home / Self Care | Payer: Medicare Other | Source: Ambulatory Visit | Attending: Radiation Oncology | Admitting: Radiation Oncology

## 2023-08-23 ENCOUNTER — Other Ambulatory Visit: Payer: Self-pay

## 2023-08-23 DIAGNOSIS — D0512 Intraductal carcinoma in situ of left breast: Secondary | ICD-10-CM | POA: Diagnosis not present

## 2023-08-23 DIAGNOSIS — Z510 Encounter for antineoplastic radiation therapy: Secondary | ICD-10-CM | POA: Diagnosis not present

## 2023-08-23 LAB — RAD ONC ARIA SESSION SUMMARY
Course Elapsed Days: 23
Plan Fractions Treated to Date: 2
Plan Prescribed Dose Per Fraction: 2 Gy
Plan Total Fractions Prescribed: 4
Plan Total Prescribed Dose: 8 Gy
Reference Point Dosage Given to Date: 4 Gy
Reference Point Session Dosage Given: 2 Gy
Session Number: 18

## 2023-08-24 ENCOUNTER — Other Ambulatory Visit: Payer: Self-pay

## 2023-08-24 ENCOUNTER — Ambulatory Visit
Admission: RE | Admit: 2023-08-24 | Discharge: 2023-08-24 | Disposition: A | Discharge status: Home / Self Care | Payer: Medicare Other | Source: Ambulatory Visit | Attending: Radiation Oncology | Admitting: Radiation Oncology

## 2023-08-24 DIAGNOSIS — D0512 Intraductal carcinoma in situ of left breast: Secondary | ICD-10-CM | POA: Diagnosis not present

## 2023-08-24 DIAGNOSIS — Z510 Encounter for antineoplastic radiation therapy: Secondary | ICD-10-CM | POA: Diagnosis not present

## 2023-08-24 LAB — RAD ONC ARIA SESSION SUMMARY
Course Elapsed Days: 24
Plan Fractions Treated to Date: 3
Plan Prescribed Dose Per Fraction: 2 Gy
Plan Total Fractions Prescribed: 4
Plan Total Prescribed Dose: 8 Gy
Reference Point Dosage Given to Date: 6 Gy
Reference Point Session Dosage Given: 2 Gy
Session Number: 19

## 2023-08-24 NOTE — Plan of Care (Signed)
[facility] Tonsillectomy/Adenoidectomy, Postoperative PEDS care plan entered in error.

## 2023-08-25 ENCOUNTER — Ambulatory Visit
Admission: RE | Admit: 2023-08-25 | Discharge: 2023-08-25 | Disposition: A | Discharge status: Home / Self Care | Payer: Medicare Other | Source: Ambulatory Visit | Attending: Radiation Oncology

## 2023-08-25 ENCOUNTER — Other Ambulatory Visit: Payer: Self-pay

## 2023-08-25 ENCOUNTER — Ambulatory Visit
Admission: RE | Admit: 2023-08-25 | Discharge: 2023-08-25 | Disposition: A | Discharge status: Home / Self Care | Payer: Medicare Other | Source: Ambulatory Visit | Attending: Radiation Oncology | Admitting: Radiation Oncology

## 2023-08-25 DIAGNOSIS — Z510 Encounter for antineoplastic radiation therapy: Secondary | ICD-10-CM | POA: Diagnosis not present

## 2023-08-25 DIAGNOSIS — D0512 Intraductal carcinoma in situ of left breast: Secondary | ICD-10-CM | POA: Diagnosis not present

## 2023-08-25 LAB — RAD ONC ARIA SESSION SUMMARY
Course Elapsed Days: 25
Plan Fractions Treated to Date: 4
Plan Prescribed Dose Per Fraction: 2 Gy
Plan Total Fractions Prescribed: 4
Plan Total Prescribed Dose: 8 Gy
Reference Point Dosage Given to Date: 8 Gy
Reference Point Session Dosage Given: 2 Gy
Session Number: 20

## 2023-08-28 NOTE — Radiation Completion Notes (Signed)
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   Intent: Curative     ==========DELIVERED PLANS==========  First Treatment Date: 2023-07-31 - Last Treatment Date: 2023-08-25   Plan Name: Breast_L_BH Site: Breast, Left Technique: 3D Mode: Photon Dose Per Fraction: 2.66 Gy Prescribed Dose (Delivered / Prescribed): 42.56 Gy / 42.56 Gy Prescribed Fxs (Delivered / Prescribed): 16 / 16   Plan Name: Brst_L_Bst_BH Site: Breast, Left Technique: 3D Mode: Photon Dose Per Fraction: 2 Gy Prescribed Dose (Delivered / Prescribed): 8 Gy / 8 Gy Prescribed Fxs (Delivered / Prescribed): 4 / 4     ==========ON TREATMENT VISIT DATES========== 2023-08-04, 2023-08-11, 2023-08-18, 2023-08-25   See weekly On Treatment Notes in Epic for details. The patient tolerated radiation. She developed fatigue and anticipated skin changes in the treatment field.   The patient will receive a call in about one month from the radiation oncology department. She will continue follow up with Dr. Al Pimple as well.      Osker Mason, PAC

## 2023-09-12 ENCOUNTER — Inpatient Hospital Stay: Payer: Medicare Other | Attending: Hematology and Oncology | Admitting: Hematology and Oncology

## 2023-09-12 VITALS — BP 123/76 | HR 77 | Temp 97.8°F | Resp 16 | Wt 193.4 lb

## 2023-09-12 DIAGNOSIS — C50912 Malignant neoplasm of unspecified site of left female breast: Secondary | ICD-10-CM | POA: Diagnosis not present

## 2023-09-12 DIAGNOSIS — D0512 Intraductal carcinoma in situ of left breast: Secondary | ICD-10-CM | POA: Diagnosis not present

## 2023-09-12 DIAGNOSIS — Z780 Asymptomatic menopausal state: Secondary | ICD-10-CM | POA: Diagnosis not present

## 2023-09-12 DIAGNOSIS — Z170 Estrogen receptor positive status [ER+]: Secondary | ICD-10-CM | POA: Diagnosis not present

## 2023-09-12 DIAGNOSIS — Z13820 Encounter for screening for osteoporosis: Secondary | ICD-10-CM | POA: Diagnosis not present

## 2023-09-12 MED ORDER — ANASTROZOLE 1 MG PO TABS: 1.0000 mg | ORAL_TABLET | Freq: Every day | ORAL | 3 refills | Status: DC

## 2023-09-12 NOTE — Assessment & Plan Note (Addendum)
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  We have discussed the following details about DCIS today. She is s.p lumpectomy, final pathology showed high-grade DCIS as well as some area of microinvasion measuring 0.5 mm.  This microinvasive tumor was deemed triple negative.  Given the tumor being very small, despite being triple negative at the age of 19, I do not see a significant role for adjuvant chemotherapy. She is now s/p adj radiation.  Post-Mastectomy Skin Changes Noted improvement in skin coloration with use of prescribed lotion. Discussed potential use of cocoa butter and expectation of continued improvement. -Continue use of lotion and consider use of cocoa butter.  Breast Cancer Discussed initiation of anastrozole for adjuvant therapy. Reviewed potential side effects including hot flashes, vaginal dryness, and joint aches, bone density loss. -Start anastrozole 1mg  daily. -Plan for bone density scan to monitor for osteoporosis, a potential long-term side effect of anastrozole. Ordered.  Weight Management Expressed desire to lose weight. Encouraged continuation of physical activity and healthy diet. -Continue physical activity and healthy diet.  Heel Pain Plan to see podiatrist for evaluation. -Continue with podiatrist appointment.  Medication Management Reviewed and updated meds.  Breast Cancer Screening Discussed the importance of continued mammograms given patient's overall health status. -Continue regular mammograms.  Follow-up Plan to assess tolerance and side effects of anastrozole. -Schedule follow-up appointment in 3-4 months.

## 2023-09-12 NOTE — Progress Notes (Signed)
[facility] CONSULT NOTE  Patient Care Team: Blair Heys, MD (Inactive) as PCP - General (Family Medicine) Lars Masson, MD as PCP - Cardiology (Cardiology) Pershing Proud, RN as Oncology Nurse Navigator Donnelly Angelica, RN as Oncology Nurse Navigator Rachel Moulds, MD as Consulting Physician (Hematology and Oncology)  CHIEF COMPLAINTS/PURPOSE OF CONSULTATION:  Newly diagnosed breast cancer  HISTORY OF PRESENTING ILLNESS:  Carmen Fischer 82 y.o. female is here because of recent diagnosis of left breast DCIS  I reviewed her records extensively and collaborated the history with the patient.  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  Diagnostic mammogram confirmed left breast calcifications   05/03/2023 Pathology Results   Left breast biopsy showed DCIS, high-grade, solid and cribriform types, necrosis and calcs present, ER 80% positive weak staining, PR 0% negative   05/24/2023 Initial Diagnosis   DCIS (ductal carcinoma in situ) of breast    Patient arrived to the appointment today by herself. Since last visit she had surgery, extensive DCIS, an area of microinvasion noted. Microinvasive tumor measured 0.5 mm and is triple neg. We discussed about adjuvant chemo but given her age, she wanted to proceed with radiation, anti estrogen therapy alone.  Discussed the use of AI scribe software for clinical note transcription with the patient, who gave verbal consent to proceed.  History of Present Illness         The patient, with a history of breast cancer, presents for follow-up after treatment. She reports that her arm was 'very, very dark' but the color is improving with the use of a prescribed lotion. She has also been using cocoa butter, which the doctor  approves of. She understands that the color may not return to exactly the same as the other arm, but is reassured that the skin will continue to regenerate. She denies any history of osteoporosis or osteopenia.  The patient also reports a problem with her heel and has an upcoming appointment with a podiatrist. She has been taking over-the-counter Tylenol for this issue. She also mentions a desire to lose weight and is encouraged to stay active, eat healthily, and drink plenty of water.  Rest of the pertinent 10 point ROS reviewed and negative  MEDICAL HISTORY:  Past Medical History:  Diagnosis Date   Arthritis    Breast cancer (HCC)    stage 0 per pt   GERD (gastroesophageal reflux disease)    Glaucoma    High cholesterol    Hypertension     SURGICAL HISTORY: Past Surgical History:  Procedure Laterality Date   ABDOMINAL HYSTERECTOMY     BREAST BIOPSY Left 05/03/2023   MM LT BREAST BX W LOC DEV 1ST LESION IMAGE BX SPEC STEREO GUIDE 05/03/2023 [facility]   BREAST BIOPSY  06/19/2023   MM LT RADIOACTIVE SEED LOC MAMMO GUIDE 06/19/2023 [facility]   BREAST LUMPECTOMY WITH RADIOACTIVE SEED LOCALIZATION Left 06/20/2023   Procedure: LEFT BREAST RADIOACTIVE SEED GUIDED LUMPECTOMY;  Surgeon: Abigail Miyamoto, MD;  Location: [facility] OR;  Service: General;  Laterality: Left;  LMA   CHOLECYSTECTOMY     LEFT HEART CATH AND CORONARY ANGIOGRAPHY N/A 03/10/2020   Procedure: LEFT HEART CATH AND CORONARY ANGIOGRAPHY;  Surgeon: [place], Peter M, MD;  Location: [facility] INVASIVE CV LAB;  Service: Cardiovascular;  Laterality: N/A;   TUBAL LIGATION  1974    SOCIAL HISTORY: Social History   Socioeconomic History   Marital status: Married    Spouse name: Not on file   Number of children: 2   Years of education: Not on file   Highest education level: Not on file  Occupational History   Not on file  Tobacco Use   Smoking status: Never   Smokeless tobacco: Never  Vaping Use   Vaping status: Never  Used  Substance and Sexual Activity   Alcohol use: Not Currently   Drug use: Never   Sexual activity: Not on file  Other Topics Concern   Not on file  Social History Narrative   Not on file   Social Determinants of Health   Financial Resource Strain: Not on file  Food Insecurity: No Food Insecurity (06/09/2023)   Hunger Vital Sign    Worried About Running Out of Food in the Last Year: Never true    Ran Out of Food in the Last Year: Never true  Transportation Needs: No Transportation Needs (06/09/2023)   PRAPARE - Administrator, Civil Service (Medical): No    Lack of Transportation (Non-Medical): No  Physical Activity: Not on file  Stress: Not on file  Social Connections: Not on file  Intimate Partner Violence: Not At Risk (06/09/2023)   Humiliation, Afraid, Rape, and Kick questionnaire    Fear of Current or Ex-Partner: No    Emotionally Abused: No    Physically Abused: No    Sexually Abused: No    FAMILY HISTORY: Family History  Problem Relation Age of Onset   Heart attack Mother    Heart attack Father    Endometriosis Daughter     ALLERGIES:  is allergic to shellfish allergy and prednisone.  MEDICATIONS:  Current Outpatient Medications  Medication Sig Dispense Refill   anastrozole (ARIMIDEX) 1 MG tablet Take 1 tablet (1 mg total) by mouth daily. 90 tablet 3   acetaminophen (TYLENOL) 500 MG tablet Take 500-1,000 mg by mouth every 6 (six) hours as needed (pain.).     aspirin EC 81 MG tablet Take 81 mg by mouth daily. (Patient not taking: Reported on 06/09/2023)     cholecalciferol (VITAMIN D3) 25 MCG (1000 UNIT) tablet Take 1,000 Units by mouth in the morning.     cycloSPORINE (RESTASIS) 0.05 % ophthalmic emulsion Place 1 drop into both eyes 2 (two) times daily.     desloratadine (CLARINEX) 5 MG tablet Take 5 mg by mouth in the morning.     hydrochlorothiazide (HYDRODIURIL) 25 MG tablet Take 25 mg by mouth in the morning.     KLOR-CON M20 20 MEQ tablet Take 20  mEq by mouth daily.     Magnesium Bisglycinate (MAG GLYCINATE PO) Take 240 mg by mouth at bedtime.     Multiple Vitamin (MULTIVITAMIN WITH MINERALS) TABS tablet Take 1 tablet by mouth daily.     omeprazole (PRILOSEC) 20 MG capsule Take 20 mg by mouth at bedtime.     OVER THE COUNTER MEDICATION Take 1 capsule by mouth in the morning and at bedtime. HydroEye Vitamin (proprietary blend of omega fatty acids (EPA, DHA & GLA)     polyethylene glycol (MIRALAX / GLYCOLAX) 17 g packet Take 17 g by mouth daily as needed for moderate constipation.      simvastatin (ZOCOR) 20 MG tablet Take 20 mg by mouth at bedtime.     spironolactone (ALDACTONE) 25 MG tablet Take 0.5 tablets (12.5 mg total) by mouth  daily. 45 tablet 3   Travoprost, BAK Free, (TRAVATAN) 0.004 % SOLN ophthalmic solution Place 1 drop into both eyes at bedtime.     vitamin E 180 MG (400 UNITS) capsule Take 400 Units by mouth in the morning.     No current facility-administered medications for this visit.    REVIEW OF SYSTEMS:   Constitutional: Denies fevers, chills or abnormal night sweats Eyes: Denies blurriness of vision, double vision or watery eyes Ears, nose, mouth, throat, and face: Denies mucositis or sore throat Respiratory: Denies cough, dyspnea or wheezes Cardiovascular: Denies palpitation, chest discomfort or lower extremity swelling Gastrointestinal:  Denies nausea, heartburn or change in bowel habits Skin: Denies abnormal skin rashes Lymphatics: Denies new lymphadenopathy or easy bruising Neurological:Denies numbness, tingling or new weaknesses Behavioral/Psych: Mood is stable, no new changes  Breast: Denies any palpable lumps or discharge All other systems were reviewed with the patient and are negative.  PHYSICAL EXAMINATION: ECOG PERFORMANCE STATUS: 0 - Asymptomatic  Vitals:   09/12/23 0858  BP: 123/76  Pulse: 77  Resp: 16  Temp: 97.8 F (36.6 C)  SpO2: 97%   Filed Weights   09/12/23 0858  Weight: 193 lb  6.4 oz (87.7 kg)    GENERAL:alert, no distress and comfortable Deferred in lieu of counseling   LABORATORY DATA:  I have reviewed the data as listed Lab Results  Component Value Date   WBC 4.3 07/13/2023   HGB 13.5 07/13/2023   HCT 41.4 07/13/2023   MCV 84.1 07/13/2023   PLT 272 07/13/2023   Lab Results  Component Value Date   NA 141 07/13/2023   K 3.6 07/13/2023   CL 104 07/13/2023   CO2 29 07/13/2023    RADIOGRAPHIC STUDIES: I have personally reviewed the radiological reports and agreed with the findings in the report.  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  We have discussed the following details about DCIS today. She is s.p lumpectomy, final pathology showed high-grade DCIS as well as some area of microinvasion measuring 0.5 mm.  This microinvasive tumor was deemed triple negative.  Given the tumor being very small, despite being triple negative at the age of 17, I do not see a significant role for adjuvant chemotherapy. She is now s/p adj radiation.  Post-Mastectomy Skin Changes Noted improvement in skin coloration with use of prescribed lotion. Discussed potential use of cocoa butter and expectation of continued improvement. -Continue use of lotion and consider use of cocoa butter.  Breast Cancer Discussed initiation of anastrozole for adjuvant therapy. Reviewed potential side effects including hot flashes, vaginal dryness, and joint aches, bone density loss. -Start anastrozole 1mg  daily. -Plan for bone density scan to monitor for osteoporosis, a potential long-term side effect of anastrozole. Ordered.  Weight Management Expressed desire to lose weight. Encouraged continuation of physical activity and healthy diet. -Continue  physical activity and healthy diet.  Heel Pain Plan to see podiatrist for evaluation. -Continue with podiatrist appointment.  Medication Management Reviewed and updated meds.  Breast Cancer Screening Discussed the importance of continued mammograms given patient's overall health status. -Continue regular mammograms.  Follow-up Plan to assess tolerance and side effects of anastrozole. -Schedule follow-up appointment in 3-4 months.      All questions were answered. The patient knows to call the clinic with any  problems, questions or concerns.    Rachel Moulds, MD 09/12/23

## 2023-09-15 ENCOUNTER — Ambulatory Visit (INDEPENDENT_AMBULATORY_CARE_PROVIDER_SITE_OTHER): Payer: Medicare Other

## 2023-09-15 ENCOUNTER — Ambulatory Visit (INDEPENDENT_AMBULATORY_CARE_PROVIDER_SITE_OTHER): Payer: Medicare Other | Admitting: Podiatry

## 2023-09-15 ENCOUNTER — Encounter: Payer: Self-pay | Admitting: Podiatry

## 2023-09-15 DIAGNOSIS — M21619 Bunion of unspecified foot: Secondary | ICD-10-CM | POA: Diagnosis not present

## 2023-09-15 DIAGNOSIS — M778 Other enthesopathies, not elsewhere classified: Secondary | ICD-10-CM

## 2023-09-15 DIAGNOSIS — M722 Plantar fascial fibromatosis: Secondary | ICD-10-CM | POA: Diagnosis not present

## 2023-09-15 MED ORDER — TRIAMCINOLONE ACETONIDE 10 MG/ML IJ SUSP
10.0000 mg | Freq: Once | INTRAMUSCULAR | Status: AC
  Administered 2023-09-15: 10 mg via INTRA_ARTICULAR

## 2023-09-15 NOTE — Patient Instructions (Signed)

## 2023-09-18 ENCOUNTER — Other Ambulatory Visit: Payer: Self-pay | Admitting: *Deleted

## 2023-09-18 NOTE — Progress Notes (Signed)
Subjective:   Patient ID: Carmen Fischer, female   DOB: 82 y.o.   MRN: [id number]   HPI Patient presents with intense discomfort left plantar fascia at the insertion of the tendon into the heel bone stating it has been hurting her for around a month.  Does not remember injury.  Patient does not smoke likes to be active   Review of Systems  All other systems reviewed and are negative.       Objective:  Physical Exam Vitals and nursing note reviewed.  Constitutional:      Appearance: She is well-developed.  Pulmonary:     Effort: Pulmonary effort is normal.  Musculoskeletal:        General: Normal range of motion.  Skin:    General: Skin is warm.  Neurological:     Mental Status: She is alert.     Neurovascular status intact muscle strength adequate range of motion within normal limits with exquisite discomfort plantar aspect left heel at the insertional point tendon calcaneus with inflammation fluid of the medial band moderate depression of the arch.  Good digital perfusion well-oriented     Assessment:  Acute Planter fasciitis left with moderate collapse medial longitudinal arch as contributing factor     Plan:  H NP x-rays reviewed sterile prep injected the fascial band at insertion calcaneus 3 mg Kenalog 5 mg Xylocaine applied fascial brace to lift up the arch with all instructions on usage and patient will be seen back to recheck  X-rays indicate moderate depression of the arch small spur no indication stress fracture arthritis

## 2023-09-24 ENCOUNTER — Emergency Department (HOSPITAL_COMMUNITY)
Admission: EM | Admit: 2023-09-24 | Discharge: 2023-09-24 | Disposition: A | Discharge status: Home / Self Care | Payer: Medicare Other | Attending: Emergency Medicine | Admitting: Emergency Medicine

## 2023-09-24 ENCOUNTER — Other Ambulatory Visit: Payer: Self-pay

## 2023-09-24 ENCOUNTER — Encounter (HOSPITAL_COMMUNITY): Payer: Self-pay | Admitting: Pharmacy Technician

## 2023-09-24 DIAGNOSIS — K649 Unspecified hemorrhoids: Secondary | ICD-10-CM | POA: Diagnosis present

## 2023-09-24 DIAGNOSIS — K5900 Constipation, unspecified: Secondary | ICD-10-CM | POA: Insufficient documentation

## 2023-09-24 DIAGNOSIS — I10 Essential (primary) hypertension: Secondary | ICD-10-CM | POA: Insufficient documentation

## 2023-09-24 DIAGNOSIS — Z7982 Long term (current) use of aspirin: Secondary | ICD-10-CM | POA: Insufficient documentation

## 2023-09-24 DIAGNOSIS — K644 Residual hemorrhoidal skin tags: Secondary | ICD-10-CM | POA: Insufficient documentation

## 2023-09-24 DIAGNOSIS — Z79899 Other long term (current) drug therapy: Secondary | ICD-10-CM | POA: Diagnosis not present

## 2023-09-24 MED ORDER — HYDROCORTISONE (PERIANAL) 2.5 % EX CREA: 1.0000 | TOPICAL_CREAM | Freq: Two times a day (BID) | CUTANEOUS | 0 refills | Status: AC

## 2023-09-24 MED ORDER — METAMUCIL SMOOTH TEXTURE 58.6 % PO POWD: 1.0000 | Freq: Three times a day (TID) | ORAL | 12 refills | Status: DC

## 2023-09-24 MED ORDER — MILK OF MAGNESIA 7.75 % PO SUSP: 15.0000 mL | Freq: Every day | ORAL | 0 refills | Status: AC | PRN

## 2023-09-24 NOTE — Discharge Instructions (Signed)
You were seen in the emergency department for your constipation and your hemorrhoids.  You should continue to take your MiraLAX every day and I have also given you a fiber supplement called Metamucil that you should take daily to help prevent constipation and hemorrhoids.  I have given you milk of magnesia that you can use as needed in addition to the enemas until you are having regular soft bowel movements again every day.  I have given you a topical steroid to help with your hemorrhoid and you should do sitz bath's.  You can follow-up with your GI doctor to have your symptoms rechecked and to see if you need further management of your hemorrhoid.  You should return to the emergency department if you are having significantly worsening pain, repetitive vomiting, uncontrolled bleeding from your hemorrhoid or any other new or concerning symptoms.

## 2023-09-24 NOTE — ED Provider Notes (Signed)
[place] EMERGENCY DEPARTMENT AT [facility] Provider Note   CSN: [id number] Arrival date & time: 09/24/23  1055     History  Chief Complaint  Patient presents with   Hemorrhoids    Carmen Fischer is a 82 y.o. female.  Patient is an 83 year old female with a past medical history of hypertension, GERD and constipation presenting to the emergency department with concern for a hemorrhoid.  The patient states that she is on daily MiraLAX however if she got constipated last week.  She states that she took an enema at home and was able to have a small bowel movement after that and has had some small liquidy stools but states that she still feels constipated.  She states that she also feels like she has a hemorrhoid.  She states that she has noticed a small amount of blood when she wiped and in the toilet.  She denies any associated abdominal pain.  She states that she has had hemorrhoids in the past but has never seen her GI doctor for it.  She states that she has been taking Preparation H over-the-counter.  The history is provided by the patient.       Home Medications Prior to Admission medications   Medication Sig Start Date End Date Taking? Authorizing Provider  hydrocortisone (ANUSOL-HC) 2.5 % rectal cream Place 1 Application rectally 2 (two) times daily. 09/24/23  Yes Theresia Lo, Turkey K, DO  magnesium hydroxide (MILK OF MAGNESIA) 400 MG/5ML suspension Take 15 mLs by mouth daily as needed for mild constipation. 09/24/23  Yes Theresia Lo, Keilyn Haggard K, DO  psyllium (METAMUCIL SMOOTH TEXTURE) 58.6 % powder Take 1 packet by mouth 3 (three) times daily. 09/24/23  Yes Elayne Snare K, DO  acetaminophen (TYLENOL) 500 MG tablet Take 500-1,000 mg by mouth every 6 (six) hours as needed (pain.).    [provider]  anastrozole (ARIMIDEX) 1 MG tablet Take 1 tablet (1 mg total) by mouth daily. 09/12/23   Rachel Moulds, MD  aspirin EC 81 MG tablet Take 81 mg by mouth daily.     [provider]  cholecalciferol (VITAMIN D3) 25 MCG (1000 UNIT) tablet Take 1,000 Units by mouth in the morning.    [provider]  cycloSPORINE (RESTASIS) 0.05 % ophthalmic emulsion Place 1 drop into both eyes 2 (two) times daily.    [provider]  desloratadine (CLARINEX) 5 MG tablet Take 5 mg by mouth in the morning. 02/13/20   [provider]  hydrochlorothiazide (HYDRODIURIL) 25 MG tablet Take 25 mg by mouth in the morning.    [provider]  KLOR-CON M20 20 MEQ tablet Take 20 mEq by mouth daily. 02/14/20   [provider]  Magnesium Bisglycinate (MAG GLYCINATE PO) Take 240 mg by mouth at bedtime.    [provider]  Multiple Vitamin (MULTIVITAMIN WITH MINERALS) TABS tablet Take 1 tablet by mouth daily.    [provider]  omeprazole (PRILOSEC) 20 MG capsule Take 20 mg by mouth at bedtime.    [provider]  OVER THE COUNTER MEDICATION Take 1 capsule by mouth in the morning and at bedtime. HydroEye Vitamin (proprietary blend of omega fatty acids (EPA, DHA & GLA)    [provider]  polyethylene glycol (MIRALAX / GLYCOLAX) 17 g packet Take 17 g by mouth daily as needed for moderate constipation.     [provider]  simvastatin (ZOCOR) 20 MG tablet Take 20 mg by mouth at bedtime.  [provider]  spironolactone (ALDACTONE) 25 MG tablet Take 0.5 tablets (12.5 mg total) by mouth daily. 03/04/20   Lars Masson, MD  Travoprost, BAK Free, (TRAVATAN) 0.004 % SOLN ophthalmic solution Place 1 drop into both eyes at bedtime.    [provider]  vitamin E 180 MG (400 UNITS) capsule Take 400 Units by mouth in the morning.    [provider]      Allergies    Shellfish allergy and Prednisone    Review of Systems   Review of Systems  Physical Exam Updated Vital Signs BP (!) 150/67   Pulse 83   Temp 98 F (36.7 C) (Oral)   Resp 16   SpO2 100%  Physical  Exam Vitals and nursing note reviewed.  Constitutional:      General: She is not in acute distress.    Appearance: Normal appearance.  HENT:     Head: Normocephalic and atraumatic.     Nose: Nose normal.     Mouth/Throat:     Mouth: Mucous membranes are moist.  Eyes:     Extraocular Movements: Extraocular movements intact.  Cardiovascular:     Rate and Rhythm: Normal rate and regular rhythm.     Heart sounds: Normal heart sounds.  Pulmonary:     Effort: Pulmonary effort is normal.     Breath sounds: Normal breath sounds.  Abdominal:     General: Abdomen is flat.     Palpations: Abdomen is soft.     Tenderness: There is no abdominal tenderness.  Genitourinary:    Comments: Non-thrombosed external hemorrhoid, no active bleeding Musculoskeletal:        General: Normal range of motion.     Cervical back: Normal range of motion.  Skin:    General: Skin is warm and dry.  Neurological:     General: No focal deficit present.     Mental Status: She is alert and oriented to person, place, and time.  Psychiatric:        Mood and Affect: Mood normal.        Behavior: Behavior normal.     ED Results / Procedures / Treatments   Labs (all labs ordered are listed, but only abnormal results are displayed) Labs Reviewed - No data to display  EKG None  Radiology No results found.  Procedures Procedures    Medications Ordered in ED Medications - No data to display  ED Course/ Medical Decision Making/ A&P                                 Medical Decision Making This patient presents to the ED with chief complaint(s) of constipation, hemorrhoid with pertinent past medical history of constipation, HTN, GERD which further complicates the presenting complaint. The complaint involves an extensive differential diagnosis and also carries with it a high risk of complications and morbidity.    The differential diagnosis includes hemorrhoid, fissure, constipation, no abdominal pain or  vomiting making obstruction unlikely  Additional history obtained: Additional history obtained from N/A Records reviewed outpatient GI records  ED Course and Reassessment: On patient's arrival she is hemodynamically stable in no acute distress.  Does have a nonthrombosed external hemorrhoid on exam without active bleeding.  The patient was recommended to add fiber supplement and milk of magnesia for her constipation and will be given hydrocortisone for her hemorrhoid.  She is also recommended sitz bath's and close  GI follow-up.  Independent labs interpretation:  N/A  Independent visualization of imaging: -N/A  Consultation: - Consulted or discussed management/test interpretation w/ external professional: N/A  Consideration for admission or further workup: Patient has no emergent conditions requiring admission or further work-up at this time and is stable for discharge home with primary care follow-up  Social Determinants of health: N/A            Final Clinical Impression(s) / ED Diagnoses Final diagnoses:  External hemorrhoids  Constipation, unspecified constipation type    Rx / DC Orders ED Discharge Orders          Ordered    hydrocortisone (ANUSOL-HC) 2.5 % rectal cream  2 times daily        09/24/23 1152    magnesium hydroxide (MILK OF MAGNESIA) 400 MG/5ML suspension  Daily PRN        09/24/23 1152    psyllium (METAMUCIL SMOOTH TEXTURE) 58.6 % powder  3 times daily        09/24/23 1152              Elayne Snare K, DO 09/24/23 1153

## 2023-09-24 NOTE — ED Triage Notes (Signed)
Pt here POV with reports of painful hemorrhoids. States has been having some constipation over the last 2 days.

## 2023-09-24 NOTE — ED Notes (Signed)
This RN reviewed discharge instructions with patient. She verbalized understanding and denied any further questions. PT well appearing upon discharge. Pt ambulated with stable gait to exit. Pt endorses ride home.

## 2023-09-25 ENCOUNTER — Ambulatory Visit
Admission: RE | Admit: 2023-09-25 | Discharge: 2023-09-25 | Disposition: A | Discharge status: Home / Self Care | Payer: Medicare Other | Source: Ambulatory Visit | Attending: Internal Medicine | Admitting: Internal Medicine

## 2023-09-25 NOTE — Progress Notes (Signed)
  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 (as documented in provider EOT note)  The patient was available for call today.   Symptoms of fatigue have improved since completing therapy.  Symptoms of skin changes have improved since completing therapy.  The patient was encouraged to avoid sun exposure in the area of prior treatment for up to one year following radiation with either sunscreen or by the style of clothing worn in the sun.  The patient has scheduled follow up with her medical oncologist Dr. Al Pimple for ongoing surveillance, and was encouraged to call if she develops concerns or questions regarding radiation.   This concludes the interaction.  Ruel Favors, LPN

## 2023-10-02 DIAGNOSIS — H40023 Open angle with borderline findings, high risk, bilateral: Secondary | ICD-10-CM | POA: Diagnosis not present

## 2023-10-02 DIAGNOSIS — Z961 Presence of intraocular lens: Secondary | ICD-10-CM | POA: Diagnosis not present

## 2023-10-02 DIAGNOSIS — H04123 Dry eye syndrome of bilateral lacrimal glands: Secondary | ICD-10-CM | POA: Diagnosis not present

## 2023-10-03 ENCOUNTER — Ambulatory Visit (HOSPITAL_BASED_OUTPATIENT_CLINIC_OR_DEPARTMENT_OTHER)
Admission: RE | Admit: 2023-10-03 | Discharge: 2023-10-03 | Disposition: A | Discharge status: Home / Self Care | Payer: Medicare Other | Source: Ambulatory Visit | Attending: Hematology and Oncology | Admitting: Hematology and Oncology

## 2023-10-03 DIAGNOSIS — Z13820 Encounter for screening for osteoporosis: Secondary | ICD-10-CM | POA: Diagnosis not present

## 2023-10-03 DIAGNOSIS — M8589 Other specified disorders of bone density and structure, multiple sites: Secondary | ICD-10-CM | POA: Diagnosis not present

## 2023-10-03 DIAGNOSIS — C50912 Malignant neoplasm of unspecified site of left female breast: Secondary | ICD-10-CM | POA: Diagnosis not present

## 2023-10-03 DIAGNOSIS — Z780 Asymptomatic menopausal state: Secondary | ICD-10-CM | POA: Insufficient documentation

## 2023-10-13 DIAGNOSIS — K921 Melena: Secondary | ICD-10-CM | POA: Diagnosis not present

## 2023-10-13 DIAGNOSIS — K5900 Constipation, unspecified: Secondary | ICD-10-CM | POA: Diagnosis not present

## 2023-10-23 ENCOUNTER — Other Ambulatory Visit (HOSPITAL_BASED_OUTPATIENT_CLINIC_OR_DEPARTMENT_OTHER): Payer: Self-pay

## 2023-10-23 ENCOUNTER — Encounter (HOSPITAL_BASED_OUTPATIENT_CLINIC_OR_DEPARTMENT_OTHER): Payer: Self-pay | Admitting: Emergency Medicine

## 2023-10-23 ENCOUNTER — Emergency Department (HOSPITAL_BASED_OUTPATIENT_CLINIC_OR_DEPARTMENT_OTHER): Payer: Medicare Other

## 2023-10-23 ENCOUNTER — Other Ambulatory Visit: Payer: Self-pay

## 2023-10-23 ENCOUNTER — Emergency Department (HOSPITAL_BASED_OUTPATIENT_CLINIC_OR_DEPARTMENT_OTHER)
Admission: EM | Admit: 2023-10-23 | Discharge: 2023-10-23 | Disposition: A | Discharge status: Home / Self Care | Payer: Medicare Other | Attending: Emergency Medicine | Admitting: Emergency Medicine

## 2023-10-23 DIAGNOSIS — R059 Cough, unspecified: Secondary | ICD-10-CM | POA: Diagnosis not present

## 2023-10-23 DIAGNOSIS — Z7982 Long term (current) use of aspirin: Secondary | ICD-10-CM | POA: Insufficient documentation

## 2023-10-23 DIAGNOSIS — I10 Essential (primary) hypertension: Secondary | ICD-10-CM | POA: Insufficient documentation

## 2023-10-23 DIAGNOSIS — B9789 Other viral agents as the cause of diseases classified elsewhere: Secondary | ICD-10-CM | POA: Diagnosis not present

## 2023-10-23 DIAGNOSIS — Z79899 Other long term (current) drug therapy: Secondary | ICD-10-CM | POA: Diagnosis not present

## 2023-10-23 DIAGNOSIS — Z20822 Contact with and (suspected) exposure to covid-19: Secondary | ICD-10-CM | POA: Diagnosis not present

## 2023-10-23 DIAGNOSIS — J069 Acute upper respiratory infection, unspecified: Secondary | ICD-10-CM | POA: Insufficient documentation

## 2023-10-23 DIAGNOSIS — R0989 Other specified symptoms and signs involving the circulatory and respiratory systems: Secondary | ICD-10-CM | POA: Diagnosis not present

## 2023-10-23 LAB — RESP PANEL BY RT-PCR (RSV, FLU A&B, COVID)  RVPGX2
Influenza A by PCR: NEGATIVE
Influenza B by PCR: NEGATIVE
Resp Syncytial Virus by PCR: NEGATIVE
SARS Coronavirus 2 by RT PCR: NEGATIVE

## 2023-10-23 MED ORDER — ACETAMINOPHEN 500 MG PO TABS
1000.0000 mg | ORAL_TABLET | Freq: Once | ORAL | Status: AC
  Administered 2023-10-23: 1000 mg via ORAL
  Filled 2023-10-23: qty 2

## 2023-10-23 MED ORDER — BENZONATATE 100 MG PO CAPS
100.0000 mg | ORAL_CAPSULE | Freq: Once | ORAL | Status: AC
Start: 2023-10-23 — End: 2023-10-23
  Administered 2023-10-23: 100 mg via ORAL
  Filled 2023-10-23: qty 1

## 2023-10-23 MED ORDER — BENZONATATE 100 MG PO CAPS
100.0000 mg | ORAL_CAPSULE | Freq: Three times a day (TID) | ORAL | 2 refills | Status: DC
  Filled 2023-10-23: qty 21, 7d supply, fill #0
  Filled 2023-10-30: qty 21, 7d supply, fill #1

## 2023-10-23 NOTE — ED Notes (Signed)
ED Provider at bedside. 

## 2023-10-23 NOTE — Discharge Instructions (Signed)
The x-ray and your swab were both normal.  You can use the [facility] as needed for the cough.  Also honey can be helpful.  If you start having shortness of breath, high fever and symptoms are getting significantly worse or you notice wheezing return to the emergency room.

## 2023-10-23 NOTE — ED Triage Notes (Signed)
Pt c/o cough and nasal drainage x 3 days. Home covid neg. Denies fever, endorses rib pain with cough

## 2023-10-23 NOTE — ED Provider Notes (Signed)
[place] EMERGENCY DEPARTMENT AT [facility] Provider Note   CSN: [id number] Arrival date & time: 10/23/23  6962     History  Chief Complaint  Patient presents with   Cough    Carmen Fischer is a 82 y.o. female.  Patient is an 82 year old female with a history of hypertension, arthritis and glaucoma who is presenting today with a 3-day history of persistent cough and nasal congestion.  She has not had a fever and only reports she feels short of breath with the coughing.  She has not had any chest pain, abdominal pain, nausea vomiting or diarrhea.  She did a home COVID test which was negative.  She has no history of lung pathology does not use inhalers regularly and has never been a smoker.  The history is provided by the patient.  Cough      Home Medications Prior to Admission medications   Medication Sig Start Date End Date Taking? Authorizing Provider  aspirin EC 81 MG tablet Take 81 mg by mouth daily.   Yes [provider]  benzonatate (TESSALON) 100 MG capsule Take 1 capsule (100 mg total) by mouth every 8 (eight) hours. 10/23/23  Yes Elyse Prevo, Alphonzo Lemmings, MD  desloratadine (CLARINEX) 5 MG tablet Take 5 mg by mouth in the morning. 02/13/20  Yes [provider]  hydrochlorothiazide (HYDRODIURIL) 25 MG tablet Take 25 mg by mouth in the morning.   Yes [provider]  spironolactone (ALDACTONE) 25 MG tablet Take 0.5 tablets (12.5 mg total) by mouth daily. 03/04/20  Yes Lars Masson, MD  acetaminophen (TYLENOL) 500 MG tablet Take 500-1,000 mg by mouth every 6 (six) hours as needed (pain.).    [provider]  anastrozole (ARIMIDEX) 1 MG tablet Take 1 tablet (1 mg total) by mouth daily. 09/12/23   Rachel Moulds, MD  cholecalciferol (VITAMIN D3) 25 MCG (1000 UNIT) tablet Take 1,000 Units by mouth in the morning.    [provider]  cycloSPORINE (RESTASIS) 0.05 % ophthalmic emulsion Place 1 drop into both eyes 2 (two) times  daily.    [provider]  hydrocortisone (ANUSOL-HC) 2.5 % rectal cream Place 1 Application rectally 2 (two) times daily. 09/24/23   Elayne Snare K, DO  KLOR-CON M20 20 MEQ tablet Take 20 mEq by mouth daily. 02/14/20   [provider]  Magnesium Bisglycinate (MAG GLYCINATE PO) Take 240 mg by mouth at bedtime.    [provider]  magnesium hydroxide (MILK OF MAGNESIA) 400 MG/5ML suspension Take 15 mLs by mouth daily as needed for mild constipation. 09/24/23   Rexford Maus, DO  Multiple Vitamin (MULTIVITAMIN WITH MINERALS) TABS tablet Take 1 tablet by mouth daily.    [provider]  omeprazole (PRILOSEC) 20 MG capsule Take 20 mg by mouth at bedtime.    [provider]  OVER THE COUNTER MEDICATION Take 1 capsule by mouth in the morning and at bedtime. HydroEye Vitamin (proprietary blend of omega fatty acids (EPA, DHA & GLA)    [provider]  polyethylene glycol (MIRALAX / GLYCOLAX) 17 g packet Take 17 g by mouth daily as needed for moderate constipation.     [provider]  psyllium (METAMUCIL SMOOTH TEXTURE) 58.6 % powder Take 1 packet by mouth 3 (three) times daily. 09/24/23   Elayne Snare K, DO  simvastatin (ZOCOR) 20 MG tablet Take 20 mg by mouth at bedtime.    [provider]  Travoprost, BAK Free, (TRAVATAN) 0.004 % SOLN ophthalmic  solution Place 1 drop into both eyes at bedtime.    [provider]  vitamin E 180 MG (400 UNITS) capsule Take 400 Units by mouth in the morning.    [provider]      Allergies    Shellfish allergy and Prednisone    Review of Systems   Review of Systems  Respiratory:  Positive for cough.     Physical Exam Updated Vital Signs BP (!) 165/86   Pulse 90   Temp 98.8 F (37.1 C)   Resp 18   SpO2 98%  Physical Exam Vitals and nursing note reviewed.  Constitutional:      General: She is not in acute distress.    Appearance: She is  well-developed.  HENT:     Head: Normocephalic and atraumatic.     Right Ear: Tympanic membrane normal.     Left Ear: Tympanic membrane normal.     Nose: Congestion present.     Mouth/Throat:     Mouth: Mucous membranes are moist.     Pharynx: No oropharyngeal exudate or posterior oropharyngeal erythema.  Eyes:     Pupils: Pupils are equal, round, and reactive to light.  Cardiovascular:     Rate and Rhythm: Normal rate and regular rhythm.     Heart sounds: Normal heart sounds. No murmur heard.    No friction rub.  Pulmonary:     Effort: Pulmonary effort is normal.     Breath sounds: Normal breath sounds. No wheezing or rales.     Comments: Coughing on exam but no wheezing Musculoskeletal:        General: No tenderness. Normal range of motion.     Comments: No edema  Skin:    General: Skin is warm and dry.     Findings: No rash.  Neurological:     Mental Status: She is alert and oriented to person, place, and time.     Cranial Nerves: No cranial nerve deficit.  Psychiatric:        Behavior: Behavior normal.     ED Results / Procedures / Treatments   Labs (all labs ordered are listed, but only abnormal results are displayed) Labs Reviewed  RESP PANEL BY RT-PCR (RSV, FLU A&B, COVID)  [id number]    EKG None  Radiology DG Chest Port 1 View Result Date: 10/23/2023 CLINICAL DATA:  Cough. EXAM: PORTABLE CHEST 1 VIEW COMPARISON:  05/25/2023. FINDINGS: Low lung volume. Redemonstration of linear opacities overlying the right mid lower lung zones, which may represent atelectasis and/or scarring. Bilateral lung fields are otherwise clear. No acute consolidation or lung collapse. Bilateral costophrenic angles are clear. Normal cardio-mediastinal silhouette. No acute osseous abnormalities. The soft tissues are within normal limits. IMPRESSION: No active disease. Electronically Signed   By: Jules Schick M.D.   On: 10/23/2023 08:35    Procedures Procedures    Medications Ordered in  ED Medications  acetaminophen (TYLENOL) tablet 1,000 mg (1,000 mg Oral Given 10/23/23 0819)  benzonatate (TESSALON) capsule 100 mg (100 mg Oral Given 10/23/23 2952)    ED Course/ Medical Decision Making/ A&P                                 Medical Decision Making Amount and/or Complexity of Data Reviewed Radiology: ordered and independent interpretation performed. Decision-making details documented in ED Course.  Risk OTC drugs. Prescription drug management.   Pt with multiple medical problems and  comorbidities and presenting today with a complaint that caries a high risk for morbidity and mortality.  Pt with symptoms consistent with viral URI/rsv.  Well appearing here.  No signs of breathing difficulty  No signs of pharyngitis, otitis or abnormal abdominal findings.  Sats are 99% on RA. I have independently visualized and interpreted pt's images today. CXR wnl  and viral swab neg.  pt to return with any further problems.          Final Clinical Impression(s) / ED Diagnoses Final diagnoses:  Viral URI with cough    Rx / DC Orders ED Discharge Orders          Ordered    benzonatate (TESSALON) 100 MG capsule  Every 8 hours        10/23/23 0918              Gwyneth Sprout, MD 10/23/23 [phone]

## 2023-11-14 ENCOUNTER — Other Ambulatory Visit: Payer: Self-pay | Admitting: *Deleted

## 2023-11-14 MED ORDER — ANASTROZOLE 1 MG PO TABS: 1.0000 mg | ORAL_TABLET | Freq: Every day | ORAL | 3 refills | Status: DC

## 2023-11-16 LAB — SURGICAL PATHOLOGY

## 2023-11-29 DIAGNOSIS — J988 Other specified respiratory disorders: Secondary | ICD-10-CM | POA: Diagnosis not present

## 2023-12-15 DIAGNOSIS — I10 Essential (primary) hypertension: Secondary | ICD-10-CM | POA: Diagnosis not present

## 2023-12-15 DIAGNOSIS — D0510 Intraductal carcinoma in situ of unspecified breast: Secondary | ICD-10-CM | POA: Diagnosis not present

## 2023-12-15 DIAGNOSIS — K219 Gastro-esophageal reflux disease without esophagitis: Secondary | ICD-10-CM | POA: Diagnosis not present

## 2023-12-15 DIAGNOSIS — E782 Mixed hyperlipidemia: Secondary | ICD-10-CM | POA: Diagnosis not present

## 2023-12-15 DIAGNOSIS — J309 Allergic rhinitis, unspecified: Secondary | ICD-10-CM | POA: Diagnosis not present

## 2024-01-02 ENCOUNTER — Encounter: Payer: Self-pay | Admitting: *Deleted

## 2024-01-08 DIAGNOSIS — D0512 Intraductal carcinoma in situ of left breast: Secondary | ICD-10-CM | POA: Diagnosis not present

## 2024-01-11 ENCOUNTER — Encounter: Payer: Self-pay | Admitting: Adult Health

## 2024-01-11 ENCOUNTER — Inpatient Hospital Stay: Payer: Medicare Other | Attending: Adult Health | Admitting: Adult Health

## 2024-01-11 VITALS — BP 140/58 | HR 72 | Temp 98.2°F | Resp 17 | Ht 63.0 in | Wt 190.6 lb

## 2024-01-11 DIAGNOSIS — Z923 Personal history of irradiation: Secondary | ICD-10-CM | POA: Diagnosis not present

## 2024-01-11 DIAGNOSIS — Z79811 Long term (current) use of aromatase inhibitors: Secondary | ICD-10-CM | POA: Insufficient documentation

## 2024-01-11 DIAGNOSIS — D0512 Intraductal carcinoma in situ of left breast: Secondary | ICD-10-CM

## 2024-01-11 DIAGNOSIS — C50912 Malignant neoplasm of unspecified site of left female breast: Secondary | ICD-10-CM | POA: Diagnosis not present

## 2024-01-11 DIAGNOSIS — Z170 Estrogen receptor positive status [ER+]: Secondary | ICD-10-CM | POA: Insufficient documentation

## 2024-01-11 NOTE — Progress Notes (Unsigned)
 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  Diagnostic mammogram confirmed left breast calcifications   05/03/2023 Pathology Results   Left breast biopsy showed DCIS, high-grade, solid and cribriform types, necrosis and calcs present, ER 80% positive weak staining, PR 0% negative   06/20/2023 Surgery   BREAST, LEFT, LUMPECTOMY:  - Focus of microinvasive ductal carcinoma, grade 2, arising in the  background of high-grade ductal carcinoma in situ with necrosis and  calcifications  - Very focally suspicious for microinvasion  - No evidence of invasive carcinoma  - Resection margins are negative for DCIS; closest is the medial margin  at 0.6 cm  - Biopsy site changes  - See oncology table    07/31/2023 - 08/25/2023 Radiation Therapy   Plan Name: Breast_L_BH Site: Breast, Left Technique: 3D Mode: Photon Dose Per Fraction: 2.66 Gy Prescribed Dose (Delivered / Prescribed): 42.56 Gy / 42.56 Gy Prescribed Fxs (Delivered / Prescribed): 16 / 16   Plan Name: Brst_L_Bst_BH Site: Breast, Left Technique: 3D Mode: Photon Dose Per Fraction: 2 Gy Prescribed Dose (Delivered / Prescribed): 8 Gy / 8 Gy Prescribed Fxs (Delivered / Prescribed): 4 / 4   09/2023 -  Anti-estrogen oral therapy   1 mg anastrozole   01/11/2024 Cancer Staging   Staging form: Breast, AJCC 8th Edition - Pathologic: Stage IA (pT61mi, pN0, cM0, G2, ER+, PR-, HER2-) - Signed by Loa Socks, NP on 01/11/2024 Histologic grading system: 3 grade system     INTERVAL HISTORY:  Carmen Fischer to review her survivorship care plan detailing her treatment course for breast cancer, as well as monitoring long-term side effects of that treatment, education regarding health maintenance, screening, and overall  wellness and health promotion.     Overall, Carmen Fischer reports feeling quite well.  She is taking anastrozole daily with good tolerance.    Lists health goals as:   Wants to maintain independence and continue doing things she enjoys with her friends, family, and faith at mass.   Maintain activity level with walking. Enjoys puzzles  REVIEW OF SYSTEMS:  Review of Systems  Constitutional:  Negative for appetite change, chills, fatigue, fever and unexpected weight change.  HENT:   Negative for hearing loss, lump/mass and trouble swallowing.   Eyes:  Negative for eye problems and icterus.  Respiratory:  Negative for chest tightness, cough and shortness of breath.   Cardiovascular:  Negative for chest pain, leg swelling and palpitations.  Gastrointestinal:  Negative for abdominal distention, abdominal pain, constipation, diarrhea, nausea and vomiting.  Endocrine: Negative for hot flashes.  Genitourinary:  Negative for difficulty urinating.   Musculoskeletal:  Negative for arthralgias.  Skin:  Negative for itching and rash.  Neurological:  Negative for dizziness, extremity weakness, headaches and numbness.  Hematological:  Negative for adenopathy. Does not bruise/bleed easily.  Psychiatric/Behavioral:  Negative for depression. The patient is not nervous/anxious.   Breast: Denies any new nodularity, masses, tenderness, nipple changes, or nipple discharge.       PAST MEDICAL/SURGICAL HISTORY:  Past Medical History:  Diagnosis Date   Arthritis    Breast cancer (HCC)    stage 0 per pt   GERD (gastroesophageal reflux disease)    Glaucoma    High cholesterol    Hypertension    Past Surgical History:  Procedure Laterality Date   ABDOMINAL HYSTERECTOMY  BREAST BIOPSY Left 05/03/2023   MM LT BREAST BX W LOC DEV 1ST LESION IMAGE BX SPEC STEREO GUIDE 05/03/2023 [facility]   BREAST BIOPSY  06/19/2023   MM LT RADIOACTIVE SEED LOC MAMMO GUIDE 06/19/2023 [facility]   BREAST  LUMPECTOMY WITH RADIOACTIVE SEED LOCALIZATION Left 06/20/2023   Procedure: LEFT BREAST RADIOACTIVE SEED GUIDED LUMPECTOMY;  Surgeon: Abigail Miyamoto, MD;  Location: [facility] OR;  Service: General;  Laterality: Left;  LMA   CHOLECYSTECTOMY     LEFT HEART CATH AND CORONARY ANGIOGRAPHY N/A 03/10/2020   Procedure: LEFT HEART CATH AND CORONARY ANGIOGRAPHY;  Surgeon: [place], Peter M, MD;  Location: [facility] INVASIVE CV LAB;  Service: Cardiovascular;  Laterality: N/A;   TUBAL LIGATION  1974     ALLERGIES:  Allergies  Allergen Reactions   Shellfish Allergy Anaphylaxis and Rash   Prednisone Other (See Comments)    Headache/elevated blood pressure     CURRENT MEDICATIONS:  Outpatient Encounter Medications as of 01/11/2024  Medication Sig Note   acetaminophen (TYLENOL) 500 MG tablet Take 500-1,000 mg by mouth every 6 (six) hours as needed (pain.).    anastrozole (ARIMIDEX) 1 MG tablet Take 1 tablet (1 mg total) by mouth daily.    aspirin EC 81 MG tablet Take 81 mg by mouth daily. 06/06/2023: On hold as of 06/01/23 due to upcoming procedure.   Bacillus Coagulans-Inulin (METAMUCIL FIBER + PROBIOTICS) CHEW Chew 3 tablets by mouth daily.    cholecalciferol (VITAMIN D3) 25 MCG (1000 UNIT) tablet Take 1,000 Units by mouth in the morning.    cycloSPORINE (RESTASIS) 0.05 % ophthalmic emulsion Place 1 drop into both eyes 2 (two) times daily.    desloratadine (CLARINEX) 5 MG tablet Take 5 mg by mouth in the morning.    hydrochlorothiazide (HYDRODIURIL) 25 MG tablet Take 25 mg by mouth in the morning.    hydrocortisone (ANUSOL-HC) 2.5 % rectal cream Place 1 Application rectally 2 (two) times daily.    KLOR-CON M20 20 MEQ tablet Take 20 mEq by mouth daily.    Magnesium Bisglycinate (MAG GLYCINATE PO) Take 240 mg by mouth at bedtime.    magnesium hydroxide (MILK OF MAGNESIA) 400 MG/5ML suspension Take 15 mLs by mouth daily as needed for mild constipation.    Multiple Vitamin (MULTIVITAMIN WITH MINERALS) TABS tablet Take  1 tablet by mouth daily.    omeprazole (PRILOSEC) 20 MG capsule Take 20 mg by mouth at bedtime.    OVER THE COUNTER MEDICATION Take 1 capsule by mouth in the morning and at bedtime. HydroEye Vitamin (proprietary blend of omega fatty acids (EPA, DHA & GLA)    polyethylene glycol (MIRALAX / GLYCOLAX) 17 g packet Take 17 g by mouth daily as needed for moderate constipation.     simvastatin (ZOCOR) 20 MG tablet Take 20 mg by mouth at bedtime.    spironolactone (ALDACTONE) 25 MG tablet Take 0.5 tablets (12.5 mg total) by mouth daily.    Travoprost, BAK Free, (TRAVATAN) 0.004 % SOLN ophthalmic solution Place 1 drop into both eyes at bedtime.    vitamin E 180 MG (400 UNITS) capsule Take 400 Units by mouth in the morning.    [DISCONTINUED] benzonatate (TESSALON) 100 MG capsule Take 1 capsule (100 mg total) by mouth every 8 (eight) hours. (Patient not taking: Reported on 01/11/2024)    [DISCONTINUED] psyllium (METAMUCIL SMOOTH TEXTURE) 58.6 % powder Take 1 packet by mouth 3 (three) times daily. (Patient not taking: Reported on 01/11/2024)    No facility-administered encounter medications  on file as of 01/11/2024.     ONCOLOGIC FAMILY HISTORY:  Family History  Problem Relation Age of Onset   Heart attack Mother    Heart attack Father    Lung cancer Sister        heavy tobacco use   Cancer Brother        unknown what kind   Endometriosis Daughter      SOCIAL HISTORY:  Social History   Socioeconomic History   Marital status: Married    Spouse name: Not on file   Number of children: 2   Years of education: Not on file   Highest education level: Not on file  Occupational History   Not on file  Tobacco Use   Smoking status: Never   Smokeless tobacco: Never  Vaping Use   Vaping status: Never Used  Substance and Sexual Activity   Alcohol use: Not Currently   Drug use: Never   Sexual activity: Not on file  Other Topics Concern   Not on file  Social History Narrative   Not on file    Social Drivers of Health   Financial Resource Strain: Not on file  Food Insecurity: No Food Insecurity (06/09/2023)   Hunger Vital Sign    Worried About Running Out of Food in the Last Year: Never true    Ran Out of Food in the Last Year: Never true  Transportation Needs: No Transportation Needs (06/09/2023)   PRAPARE - Administrator, Civil Service (Medical): No    Lack of Transportation (Non-Medical): No  Physical Activity: Not on file  Stress: Not on file  Social Connections: Not on file  Intimate Partner Violence: Not At Risk (06/09/2023)   Humiliation, Afraid, Rape, and Kick questionnaire    Fear of Current or Ex-Partner: No    Emotionally Abused: No    Physically Abused: No    Sexually Abused: No     OBSERVATIONS/OBJECTIVE:  BP (!) 140/58 (BP Location: Left Arm, Patient Position: Sitting)   Pulse 72   Temp 98.2 F (36.8 C) (Temporal)   Resp 17   Ht 5\' 3"  (1.6 m)   Wt 190 lb 9.6 oz (86.5 kg)   SpO2 98%   BMI 33.76 kg/m  GENERAL: Patient is a well appearing female in no acute distress HEENT:  Sclerae anicteric.  Oropharynx clear and moist. No ulcerations or evidence of oropharyngeal candidiasis. Neck is supple.  NODES:  No cervical, supraclavicular, or axillary lymphadenopathy palpated.  BREAST EXAM:  left breast s/p lumpectomy and radiation, no sign of local recurrence, right breast benign LUNGS:  Clear to auscultation bilaterally.  No wheezes or rhonchi. HEART:  Regular rate and rhythm. No murmur appreciated. ABDOMEN:  Soft, nontender.  Positive, normoactive bowel sounds. No organomegaly palpated. MSK:  No focal spinal tenderness to palpation. Full range of motion bilaterally in the upper extremities. EXTREMITIES:  No peripheral edema.   SKIN:  Clear with no obvious rashes or skin changes. No nail dyscrasia. NEURO:  Nonfocal. Well oriented.  Appropriate affect.   LABORATORY DATA:  None for this visit.  DIAGNOSTIC IMAGING:  None for this visit.       ASSESSMENT AND PLAN:  Ms.. Fischer is a pleasant 83 y.o. female with Stage IA left breast invasive ductal carcinoma, ER+/PR+/HER2-, diagnosed in 04/2023, treated with lumpectomy, adjuvant radiation therapy, and anti-estrogen therapy with Anastrozole beginning in 09/2023.  She presents to the Survivorship Clinic for our initial meeting and routine follow-up post-completion of treatment  for breast cancer.    1. Stage IA left breast cancer:  Carmen Fischer is continuing to recover from definitive treatment for breast cancer. She will follow-up with her medical oncologist, Dr. Al Pimple in 6 months with history and physical exam per surveillance protocol.  She will continue her anti-estrogen therapy with Anastrozole. Thus far, she is tolerating the Anastrozole well, with minimal side effects. Her mammogram is due 03/2024; orders placed today.     Today, a comprehensive survivorship care plan and treatment summary was reviewed with the patient today detailing her breast cancer diagnosis, treatment course, potential late/long-term effects of treatment, appropriate follow-up care with recommendations for the future, and patient education resources.  A copy of this summary, along with a letter will be sent to the patient's primary care provider via mail/fax/In Basket message after today's visit.    2. Bone health:  Given Carmen Fischer age/history of breast cancer and her current treatment regimen including anti-estrogen therapy with Anastrozole, she is at risk for bone demineralization.  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  Repeat is recommended in 2 years time. She was given education on specific activities to promote bone health.  3. Cancer screening:  Due to Carmen Fischer's history and her age, she should receive screening for skin cancers.  The information and recommendations are listed on the patient's comprehensive care plan/treatment summary and were  reviewed in detail with the patient.    4. Health maintenance and wellness promotion: Carmen Fischer was encouraged to consume 5-7 servings of fruits and vegetables per day. We reviewed the "Nutrition Rainbow" handout.  She was also encouraged to engage in moderate to vigorous exercise for 30 minutes per day most days of the week.  She was instructed to limit her alcohol consumption and continue to abstain from tobacco use.     5. Support services/counseling: It is not uncommon for this period of the patient's cancer care trajectory to be one of many emotions and stressors.   She was given information regarding our available services and encouraged to contact me with any questions or for help enrolling in any of our support group/programs.    Follow up instructions:    -Return to cancer center in 6 months for f/u with Dr. Al Pimple  -Mammogram due in 03/2024 -DEXA in 09/2025 -She is welcome to return back to the Survivorship Clinic at any time; no additional follow-up needed at this time.  -Consider referral back to survivorship as a long-term survivor for continued surveillance  The patient was provided an opportunity to ask questions and all were answered. The patient agreed with the plan and demonstrated an understanding of the instructions.   Total encounter time:30 minutes*in face-to-face visit time, chart review, lab review, care coordination, order entry, and documentation of the encounter time.    Lillard Anes, NP 01/12/24 3:09 PM Medical Oncology and Hematology [facility] [address] [place], [place] [address] Tel. [phone]    Fax. [phone]  *Total Encounter Time as defined by the Centers for Medicare and Medicaid Services includes, in addition to the face-to-face time of a patient visit (documented in the note above) non-face-to-face time: obtaining and reviewing outside history, ordering and reviewing medications, tests or procedures, care coordination  (communications with other health care professionals or caregivers) and documentation in the medical record.

## 2024-01-12 ENCOUNTER — Telehealth: Payer: Self-pay | Admitting: Adult Health

## 2024-01-12 NOTE — Telephone Encounter (Signed)
 Scheduled appointment per 3/13 los. Left VM with appointment details.

## 2024-03-28 DIAGNOSIS — M25511 Pain in right shoulder: Secondary | ICD-10-CM | POA: Diagnosis not present

## 2024-04-11 ENCOUNTER — Ambulatory Visit
Admission: RE | Admit: 2024-04-11 | Discharge: 2024-04-11 | Disposition: A | Discharge status: Home / Self Care | Source: Ambulatory Visit | Attending: Adult Health | Admitting: Adult Health

## 2024-04-11 DIAGNOSIS — Z08 Encounter for follow-up examination after completed treatment for malignant neoplasm: Secondary | ICD-10-CM | POA: Diagnosis not present

## 2024-04-11 DIAGNOSIS — C50912 Malignant neoplasm of unspecified site of left female breast: Secondary | ICD-10-CM

## 2024-04-11 DIAGNOSIS — Z853 Personal history of malignant neoplasm of breast: Secondary | ICD-10-CM | POA: Diagnosis not present

## 2024-04-11 DIAGNOSIS — D0512 Intraductal carcinoma in situ of left breast: Secondary | ICD-10-CM

## 2024-05-23 DIAGNOSIS — R600 Localized edema: Secondary | ICD-10-CM | POA: Diagnosis not present

## 2024-05-23 DIAGNOSIS — R5383 Other fatigue: Secondary | ICD-10-CM | POA: Diagnosis not present

## 2024-06-17 DIAGNOSIS — R109 Unspecified abdominal pain: Secondary | ICD-10-CM | POA: Diagnosis not present

## 2024-06-17 DIAGNOSIS — K219 Gastro-esophageal reflux disease without esophagitis: Secondary | ICD-10-CM | POA: Diagnosis not present

## 2024-06-17 DIAGNOSIS — H409 Unspecified glaucoma: Secondary | ICD-10-CM | POA: Diagnosis not present

## 2024-06-17 DIAGNOSIS — L243 Irritant contact dermatitis due to cosmetics: Secondary | ICD-10-CM | POA: Diagnosis not present

## 2024-06-17 DIAGNOSIS — Z0000 Encounter for general adult medical examination without abnormal findings: Secondary | ICD-10-CM | POA: Diagnosis not present

## 2024-06-17 DIAGNOSIS — D0510 Intraductal carcinoma in situ of unspecified breast: Secondary | ICD-10-CM | POA: Diagnosis not present

## 2024-06-17 DIAGNOSIS — Z1331 Encounter for screening for depression: Secondary | ICD-10-CM | POA: Diagnosis not present

## 2024-06-17 DIAGNOSIS — I10 Essential (primary) hypertension: Secondary | ICD-10-CM | POA: Diagnosis not present

## 2024-06-17 DIAGNOSIS — E782 Mixed hyperlipidemia: Secondary | ICD-10-CM | POA: Diagnosis not present

## 2024-06-27 DIAGNOSIS — L308 Other specified dermatitis: Secondary | ICD-10-CM | POA: Diagnosis not present

## 2024-06-28 ENCOUNTER — Telehealth: Payer: Self-pay | Admitting: Genetic Counselor

## 2024-06-28 NOTE — Telephone Encounter (Signed)
 I messaged Dr. 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  Elye Harmsen, MS, [facility] Genetic Counselor [email or web address] (P) [phone]

## 2024-07-15 ENCOUNTER — Inpatient Hospital Stay: Attending: Hematology and Oncology | Admitting: Hematology and Oncology

## 2024-07-15 VITALS — BP 125/62 | HR 68 | Temp 97.6°F | Resp 16 | Wt 189.2 lb

## 2024-07-15 DIAGNOSIS — Z79811 Long term (current) use of aromatase inhibitors: Secondary | ICD-10-CM | POA: Insufficient documentation

## 2024-07-15 DIAGNOSIS — Z923 Personal history of irradiation: Secondary | ICD-10-CM | POA: Diagnosis not present

## 2024-07-15 DIAGNOSIS — Z170 Estrogen receptor positive status [ER+]: Secondary | ICD-10-CM | POA: Diagnosis not present

## 2024-07-15 DIAGNOSIS — Z801 Family history of malignant neoplasm of trachea, bronchus and lung: Secondary | ICD-10-CM | POA: Diagnosis not present

## 2024-07-15 DIAGNOSIS — D0512 Intraductal carcinoma in situ of left breast: Secondary | ICD-10-CM | POA: Insufficient documentation

## 2024-07-15 DIAGNOSIS — R0781 Pleurodynia: Secondary | ICD-10-CM | POA: Diagnosis not present

## 2024-07-15 DIAGNOSIS — Z809 Family history of malignant neoplasm, unspecified: Secondary | ICD-10-CM | POA: Diagnosis not present

## 2024-07-15 DIAGNOSIS — M8580 Other specified disorders of bone density and structure, unspecified site: Secondary | ICD-10-CM | POA: Insufficient documentation

## 2024-07-15 DIAGNOSIS — C50912 Malignant neoplasm of unspecified site of left female breast: Secondary | ICD-10-CM | POA: Diagnosis not present

## 2024-07-15 NOTE — Progress Notes (Signed)
 [facility] CONSULT NOTE  Patient Care Team: Pa, [facility] as PCP - Diedre Loretha Ash, MD as Consulting Physician (Hematology and Oncology) Vernetta Berg, MD as Consulting Physician (General Surgery) Dewey Rush, MD as Consulting Physician (Radiation Oncology)  CHIEF COMPLAINTS/PURPOSE OF CONSULTATION:  Newly diagnosed breast cancer  HISTORY OF PRESENTING ILLNESS:  Carmen Fischer 83 y.o. female is here because of recent diagnosis of left breast DCIS  I reviewed her records extensively and collaborated the history with the patient.  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  Diagnostic mammogram confirmed left breast calcifications   05/03/2023 Pathology Results   Left breast biopsy showed DCIS, high-grade, solid and cribriform types, necrosis and calcs present, ER 80% positive weak staining, PR 0% negative   06/20/2023 Surgery   BREAST, LEFT, LUMPECTOMY:  - Focus of microinvasive ductal carcinoma, grade 2, arising in the  background of high-grade ductal carcinoma in situ with necrosis and  calcifications  - Very focally suspicious for microinvasion  - No evidence of invasive carcinoma  - Resection margins are negative for DCIS; closest is the medial margin  at 0.6 cm  - Biopsy site changes  - See oncology table    07/31/2023 - 08/25/2023 Radiation Therapy   Plan Name: Breast_L_BH Site: Breast, Left Technique: 3D Mode: Photon Dose Per Fraction: 2.66 Gy Prescribed Dose (Delivered / Prescribed): 42.56 Gy / 42.56 Gy Prescribed Fxs (Delivered / Prescribed): 16 / 16   Plan Name: Brst_L_Bst_BH Site: Breast, Left Technique: 3D Mode: Photon Dose Per Fraction: 2 Gy Prescribed Dose (Delivered / Prescribed): 8 Gy / 8 Gy Prescribed  Fxs (Delivered / Prescribed): 4 / 4   09/2023 -  Anti-estrogen oral therapy   1 mg anastrozole    01/11/2024 Cancer Staging   Staging form: Breast, AJCC 8th Edition - Pathologic: Stage IA (pT46mi, pN0, cM0, G2, ER+, PR-, HER2-) - Signed by Crawford Morna Pickle, NP on 01/11/2024 Histologic grading system: 3 grade system    Patient arrived to the appointment today by herself. Since last visit she had surgery, extensive DCIS, an area of microinvasion noted. Microinvasive tumor measured 0.5 mm and is triple neg. We discussed about adjuvant chemo but given her age, she wanted to proceed with radiation, anti estrogen therapy alone.  Discussed the use of AI scribe software for clinical note transcription with the patient, who gave verbal consent to proceed.  History of Present Illness Carmen Fischer is an 83 year old female who presents with dull pain in her right rib cage,  She has been experiencing a dull, persistent pain in her side for approximately one month, rated as a 4 out of 10. The pain is located towards her lower back, near the rib cage, and has not been alleviated by Tylenol . There is no history of recent trauma or increased physical activity that could have aggravated the pain.  She is currently taking anastrozole  at night, but she questions why her pain would affect only one side. Despite this, there has been no improvement in the pain, and she is concerned about its persistence.  Her past medical history includes breast cancer. A mammogram in June was normal. She also had a previous issue with heel pain, which resolved after an injection.   Rest of the pertinent 10 point ROS reviewed and negative  MEDICAL HISTORY:  Past Medical History:  Diagnosis Date   Arthritis    Breast cancer (HCC)    stage 0 per pt   GERD (gastroesophageal reflux disease)    Glaucoma    High cholesterol    Hypertension     SURGICAL HISTORY: Past Surgical History:  Procedure Laterality Date    ABDOMINAL HYSTERECTOMY     BREAST BIOPSY Left 05/03/2023   MM LT BREAST BX W LOC DEV 1ST LESION IMAGE BX SPEC STEREO GUIDE 05/03/2023 [facility]   BREAST BIOPSY  06/19/2023   MM LT RADIOACTIVE SEED LOC MAMMO GUIDE 06/19/2023 [facility]   BREAST LUMPECTOMY WITH RADIOACTIVE SEED LOCALIZATION Left 06/20/2023   Procedure: LEFT BREAST RADIOACTIVE SEED GUIDED LUMPECTOMY;  Surgeon: Vernetta Berg, MD;  Location: [facility] OR;  Service: General;  Laterality: Left;  LMA   CHOLECYSTECTOMY     LEFT HEART CATH AND CORONARY ANGIOGRAPHY N/A 03/10/2020   Procedure: LEFT HEART CATH AND CORONARY ANGIOGRAPHY;  Surgeon: [place]azil[place], Peter M, MD;  Location: [facility] INVASIVE CV LAB;  Service: Cardiovascular;  Laterality: N/A;   TUBAL LIGATION  1974    SOCIAL HISTORY: Social History   Socioeconomic History   Marital status: Married    Spouse name: Not on file   Number of children: 2   Years of education: Not on file   Highest education level: Not on file  Occupational History   Not on file  Tobacco Use   Smoking status: Never   Smokeless tobacco: Never  Vaping Use   Vaping status: Never Used  Substance and Sexual Activity   Alcohol use: Not Currently   Drug use: Never   Sexual activity: Not on file  Other Topics Concern   Not on file  Social History Narrative   Not on file   Social Drivers of Health   Financial Resource Strain: Not on file  Food Insecurity: No Food Insecurity (06/09/2023)   Hunger Vital Sign    Worried About Running Out of Food in the Last Year: Never true    Ran Out of Food in the Last Year: Never true  Transportation Needs: No Transportation Needs (06/09/2023)   PRAPARE - Administrator, Civil Service (Medical): No    Lack of Transportation (Non-Medical): No  Physical Activity: Not on file  Stress: Not on file  Social Connections: Not on file  Intimate Partner Violence: Not At Risk (06/09/2023)   Humiliation, Afraid, Rape, and Kick questionnaire    Fear of Current  or Ex-Partner: No    Emotionally Abused: No    Physically Abused: No    Sexually Abused: No    FAMILY HISTORY: Family History  Problem Relation Age of Onset   Heart attack Mother    Heart attack Father    Lung cancer Sister        heavy tobacco use   Cancer Brother        unknown what kind   Endometriosis Daughter     ALLERGIES:  is allergic to shellfish allergy and prednisone.  MEDICATIONS:  Current Outpatient Medications  Medication Sig Dispense Refill   acetaminophen  (TYLENOL ) 500 MG tablet Take 500-1,000 mg by mouth every 6 (six) hours as needed (pain.).     anastrozole  (ARIMIDEX ) 1 MG tablet Take 1 tablet (1 mg total) by mouth daily. 90 tablet 3   aspirin  EC 81 MG tablet Take 81 mg by mouth daily.     cholecalciferol (VITAMIN D3) 25 MCG (1000 UNIT) tablet Take 1,000 Units by mouth  in the morning.     cycloSPORINE (RESTASIS) 0.05 % ophthalmic emulsion Place 1 drop into both eyes 2 (two) times daily.     desloratadine (CLARINEX) 5 MG tablet Take 5 mg by mouth in the morning.     hydrochlorothiazide (HYDRODIURIL) 25 MG tablet Take 25 mg by mouth in the morning.     hydrocortisone  (ANUSOL -HC) 2.5 % rectal cream Place 1 Application rectally 2 (two) times daily. 30 g 0   KLOR-CON  M20 20 MEQ tablet Take 20 mEq by mouth daily.     Magnesium Bisglycinate (MAG GLYCINATE PO) Take 240 mg by mouth at bedtime.     magnesium hydroxide (MILK OF MAGNESIA) 400 MG/5ML suspension Take 15 mLs by mouth daily as needed for mild constipation. 355 mL 0   Multiple Vitamin (MULTIVITAMIN WITH MINERALS) TABS tablet Take 1 tablet by mouth daily.     omeprazole (PRILOSEC) 20 MG capsule Take 20 mg by mouth at bedtime.     OVER THE COUNTER MEDICATION Take 1 capsule by mouth in the morning and at bedtime. HydroEye Vitamin (proprietary blend of omega fatty acids (EPA, DHA & GLA)     polyethylene glycol (MIRALAX / GLYCOLAX) 17 g packet Take 17 g by mouth daily as needed for moderate constipation.       simvastatin  (ZOCOR ) 20 MG tablet Take 20 mg by mouth at bedtime.     spironolactone  (ALDACTONE ) 25 MG tablet Take 0.5 tablets (12.5 mg total) by mouth daily. 45 tablet 3   Travoprost , BAK Free, (TRAVATAN ) 0.004 % SOLN ophthalmic solution Place 1 drop into both eyes at bedtime.     vitamin E 180 MG (400 UNITS) capsule Take 400 Units by mouth in the morning.     Bacillus Coagulans-Inulin (METAMUCIL FIBER + PROBIOTICS) CHEW Chew 3 tablets by mouth daily. (Patient not taking: Reported on 07/15/2024)     No current facility-administered medications for this visit.    REVIEW OF SYSTEMS:   Constitutional: Denies fevers, chills or abnormal night sweats Eyes: Denies blurriness of vision, double vision or watery eyes Ears, nose, mouth, throat, and face: Denies mucositis or sore throat Respiratory: Denies cough, dyspnea or wheezes Cardiovascular: Denies palpitation, chest discomfort or lower extremity swelling Gastrointestinal:  Denies nausea, heartburn or change in bowel habits Skin: Denies abnormal skin rashes Lymphatics: Denies new lymphadenopathy or easy bruising Neurological:Denies numbness, tingling or new weaknesses Behavioral/Psych: Mood is stable, no new changes  Breast: Denies any palpable lumps or discharge All other systems were reviewed with the patient and are negative.  PHYSICAL EXAMINATION: ECOG PERFORMANCE STATUS: 0 - Asymptomatic  Vitals:   07/15/24 0941  BP: 125/62  Pulse: 68  Resp: 16  Temp: 97.6 F (36.4 C)  SpO2: 99%   Filed Weights   07/15/24 0941  Weight: 189 lb 3.2 oz (85.8 kg)    GENERAL:alert, no distress and comfortable Breast exam: No concerns. No palpable masses or regional adenopathy No LE edema.   LABORATORY DATA:  I have reviewed the data as listed Lab Results  Component Value Date   WBC 4.3 07/13/2023   HGB 13.5 07/13/2023   HCT 41.4 07/13/2023   MCV 84.1 07/13/2023   PLT 272 07/13/2023   Lab Results  Component Value Date   NA 141  07/13/2023   K 3.6 07/13/2023   CL 104 07/13/2023   CO2 29 07/13/2023    RADIOGRAPHIC STUDIES: I have personally reviewed the radiological reports and agreed with the findings in the report.  ASSESSMENT AND  PLAN:   Assessment and Plan Assessment & Plan Breast cancer No evidence of recurrence. Recent mammogram negative. Anastrozole  may cause bone pain. - Continue anastrozole . - Maintain regular follow-ups and screenings.  Right-sided rib cage pain Persistent dull pain, likely musculoskeletal, possibly related to anastrozole . Unlikely related to cancer recurrence. - Monitor symptoms for one month. - Investigate if pain persists or worsens. - Contact if pain becomes unbearable.  Osteopenia,  Continue weight bearing exercises Continue ca and vit D supplementation  Time spent: 30 min  All questions were answered. The patient knows to call the clinic with any problems, questions or concerns.    Amber Stalls, MD 07/15/24

## 2024-07-15 NOTE — Assessment & Plan Note (Addendum)
 This is a very pleasant 83 year old postmenopausal female patient with left breast ER weakly positive, PR negative DCIS referred to medical oncology for additional recommendations.  We have discussed the following details about DCIS today. She is s.p lumpectomy, final pathology showed high-grade DCIS as well as some area of microinvasion measuring 0.5 mm.  This microinvasive tumor was deemed triple negative.  Given the tumor being very small, despite being triple negative at the age of 5, she declined adj chemotherapy Assessment and Plan Assessment & Plan Breast cancer No evidence of recurrence. Recent mammogram negative. Anastrozole  may cause bone pain. - Continue anastrozole . - Maintain regular follow-ups and screenings.  Right-sided rib cage pain Persistent dull pain, likely musculoskeletal, possibly related to anastrozole . Unlikely related to cancer recurrence. - Monitor symptoms for one month. - Investigate if pain persists or worsens. - Contact if pain becomes unbearable.

## 2024-07-22 DIAGNOSIS — Z23 Encounter for immunization: Secondary | ICD-10-CM | POA: Diagnosis not present

## 2024-07-24 DIAGNOSIS — M546 Pain in thoracic spine: Secondary | ICD-10-CM | POA: Diagnosis not present

## 2024-07-24 DIAGNOSIS — I10 Essential (primary) hypertension: Secondary | ICD-10-CM | POA: Diagnosis not present

## 2024-08-12 ENCOUNTER — Inpatient Hospital Stay: Attending: Hematology and Oncology | Admitting: Hematology and Oncology

## 2024-08-12 DIAGNOSIS — C50912 Malignant neoplasm of unspecified site of left female breast: Secondary | ICD-10-CM

## 2024-08-12 NOTE — Progress Notes (Signed)
  [facility] CONSULT [facility]  Patient Care Team: Pa, [facility] as PCP - Diedre Loretha Ash, MD as Consulting Physician (Hematology and Oncology) Vernetta Berg, MD as Consulting Physician (General Surgery) Dewey Rush, MD as Consulting Physician (Radiation Oncology)  CHIEF COMPLAINTS/PURPOSE OF CONSULTATION:  Newly diagnosed breast cancer  HISTORY OF PRESENTING ILLNESS:  Carmen Fischer 83 y.o. female is here because of recent diagnosis of left breast DCIS  I reviewed her records extensively and collaborated the history with the patient.  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  Diagnostic mammogram confirmed left breast calcifications   05/03/2023 Pathology Results   Left breast biopsy showed DCIS, high-grade, solid and cribriform types, necrosis and calcs present, ER 80% positive weak staining, PR 0% negative   06/20/2023 Surgery   BREAST, LEFT, LUMPECTOMY:  - Focus of microinvasive ductal carcinoma, grade 2, arising in the  background of high-grade ductal carcinoma in situ with necrosis and  calcifications  - Very focally suspicious for microinvasion  - No evidence of invasive carcinoma  - Resection margins are negative for DCIS; closest is the medial margin  at 0.6 cm  - Biopsy site changes  - See oncology table    07/31/2023 - 08/25/2023 Radiation Therapy   Plan Name: Breast_L_BH Site: Breast, Left Technique: 3D Mode: Photon Dose Per Fraction: 2.66 Gy Prescribed Dose (Delivered / Prescribed): 42.56 Gy / 42.56 Gy Prescribed Fxs (Delivered / Prescribed): 16 / 16   Plan Name: Brst_L_Bst_BH Site: Breast, Left Technique: 3D Mode: Photon Dose Per Fraction: 2 Gy Prescribed Dose (Delivered / Prescribed): 8 Gy / 8 Gy Prescribed  Fxs (Delivered / Prescribed): 4 / 4   09/2023 -  Anti-estrogen oral therapy   1 mg anastrozole    01/11/2024 Cancer Staging   Staging form: Breast, AJCC 8th Edition - Pathologic: Stage IA (pT49mi, pN0, cM0, G2, ER+, PR-, HER2-) - Signed by Crawford Morna Pickle, NP on 01/11/2024 Histologic grading system: 3 grade system    Patient arrived to the appointment today by herself. Since last visit she had surgery, extensive DCIS, an area of microinvasion noted. Microinvasive tumor measured 0.5 mm and is triple neg. We discussed about adjuvant chemo but given her age, she wanted to proceed with radiation, anti estrogen therapy alone.  Discussed the use of AI scribe software for clinical note transcription with the patient, who gave verbal consent to proceed.  History of Present Illness  She is doing well this morning. She denies any new complaints. The right sided rib cage pain has improved since  we last spoke with her. Apparently her PCP prescribed some meloxicam and methocarbamol. This has helped. She says the right sided rib cage pain is 2/10 and barely present. She also mentions of mild ankle swelling today, but otherwise is doing very well. Rest of the pertinent 10 point ROS reviewed and neg.   MEDICAL HISTORY:  Past Medical History:  Diagnosis Date   Arthritis    Breast cancer (HCC)    stage 0 per pt   GERD (gastroesophageal reflux disease)    Glaucoma    High cholesterol    Hypertension     SURGICAL HISTORY: Past Surgical History:  Procedure Laterality Date   ABDOMINAL HYSTERECTOMY     BREAST BIOPSY Left 05/03/2023   MM LT BREAST BX W LOC DEV 1ST  LESION IMAGE BX SPEC STEREO GUIDE 05/03/2023 [facility]   BREAST BIOPSY  06/19/2023   MM LT RADIOACTIVE SEED LOC MAMMO GUIDE 06/19/2023 [facility]   BREAST LUMPECTOMY WITH RADIOACTIVE SEED LOCALIZATION Left 06/20/2023   Procedure: LEFT BREAST RADIOACTIVE SEED GUIDED LUMPECTOMY;  Surgeon: Vernetta Berg, MD;  Location:  [facility] OR;  Service: General;  Laterality: Left;  LMA   CHOLECYSTECTOMY     LEFT HEART CATH AND CORONARY ANGIOGRAPHY N/A 03/10/2020   Procedure: LEFT HEART CATH AND CORONARY ANGIOGRAPHY;  Surgeon: [place]azil[place], Peter M, MD;  Location: [facility] INVASIVE CV LAB;  Service: Cardiovascular;  Laterality: N/A;   TUBAL LIGATION  1974    SOCIAL HISTORY: Social History   Socioeconomic History   Marital status: Married    Spouse name: Not on file   Number of children: 2   Years of education: Not on file   Highest education level: Not on file  Occupational History   Not on file  Tobacco Use   Smoking status: Never   Smokeless tobacco: Never  Vaping Use   Vaping status: Never Used  Substance and Sexual Activity   Alcohol use: Not Currently   Drug use: Never   Sexual activity: Not on file  Other Topics Concern   Not on file  Social History Narrative   Not on file   Social Drivers of Health   Financial Resource Strain: Not on file  Food Insecurity: No Food Insecurity (06/09/2023)   Hunger Vital Sign    Worried About Running Out of Food in the Last Year: Never true    Ran Out of Food in the Last Year: Never true  Transportation Needs: No Transportation Needs (06/09/2023)   PRAPARE - Administrator, Civil Service (Medical): No    Lack of Transportation (Non-Medical): No  Physical Activity: Not on file  Stress: Not on file  Social Connections: Not on file  Intimate Partner Violence: Not At Risk (06/09/2023)   Humiliation, Afraid, Rape, and Kick questionnaire    Fear of Current or Ex-Partner: No    Emotionally Abused: No    Physically Abused: No    Sexually Abused: No    FAMILY HISTORY: Family History  Problem Relation Age of Onset   Heart attack Mother    Heart attack Father    Lung cancer Sister        heavy tobacco use   Cancer Brother        unknown what kind   Endometriosis Daughter     ALLERGIES:  is allergic to shellfish allergy and prednisone.  MEDICATIONS:  Current  Outpatient Medications  Medication Sig Dispense Refill   acetaminophen  (TYLENOL ) 500 MG tablet Take 500-1,000 mg by mouth every 6 (six) hours as needed (pain.).     anastrozole  (ARIMIDEX ) 1 MG tablet Take 1 tablet (1 mg total) by mouth daily. 90 tablet 3   aspirin  EC 81 MG tablet Take 81 mg by mouth daily.     Bacillus Coagulans-Inulin (METAMUCIL FIBER + PROBIOTICS) CHEW Chew 3 tablets by mouth daily. (Patient not taking: Reported on 07/15/2024)     cholecalciferol (VITAMIN D3) 25 MCG (1000 UNIT) tablet Take 1,000 Units by mouth in the morning.     cycloSPORINE (RESTASIS) 0.05 % ophthalmic emulsion Place 1 drop into both eyes 2 (two) times daily.     desloratadine (CLARINEX) 5 MG tablet Take 5 mg by mouth in the morning.     hydrochlorothiazide (HYDRODIURIL) 25 MG tablet Take 25 mg by  mouth in the morning.     hydrocortisone  (ANUSOL -HC) 2.5 % rectal cream Place 1 Application rectally 2 (two) times daily. 30 g 0   KLOR-CON  M20 20 MEQ tablet Take 20 mEq by mouth daily.     Magnesium Bisglycinate (MAG GLYCINATE PO) Take 240 mg by mouth at bedtime.     magnesium hydroxide (MILK OF MAGNESIA) 400 MG/5ML suspension Take 15 mLs by mouth daily as needed for mild constipation. 355 mL 0   Multiple Vitamin (MULTIVITAMIN WITH MINERALS) TABS tablet Take 1 tablet by mouth daily.     omeprazole (PRILOSEC) 20 MG capsule Take 20 mg by mouth at bedtime.     OVER THE COUNTER MEDICATION Take 1 capsule by mouth in the morning and at bedtime. HydroEye Vitamin (proprietary blend of omega fatty acids (EPA, DHA & GLA)     polyethylene glycol (MIRALAX / GLYCOLAX) 17 g packet Take 17 g by mouth daily as needed for moderate constipation.      simvastatin  (ZOCOR ) 20 MG tablet Take 20 mg by mouth at bedtime.     spironolactone  (ALDACTONE ) 25 MG tablet Take 0.5 tablets (12.5 mg total) by mouth daily. 45 tablet 3   Travoprost , BAK Free, (TRAVATAN ) 0.004 % SOLN ophthalmic solution Place 1 drop into both eyes at bedtime.      vitamin E 180 MG (400 UNITS) capsule Take 400 Units by mouth in the morning.     No current facility-administered medications for this visit.    REVIEW OF SYSTEMS:   Constitutional: Denies fevers, chills or abnormal night sweats Eyes: Denies blurriness of vision, double vision or watery eyes Ears, nose, mouth, throat, and face: Denies mucositis or sore throat Respiratory: Denies cough, dyspnea or wheezes Cardiovascular: Denies palpitation, chest discomfort or lower extremity swelling Gastrointestinal:  Denies nausea, heartburn or change in bowel habits Skin: Denies abnormal skin rashes Lymphatics: Denies new lymphadenopathy or easy bruising Neurological:Denies numbness, tingling or new weaknesses Behavioral/Psych: Mood is stable, no new changes  Breast: Denies any palpable lumps or discharge All other systems were reviewed with the patient and are negative.  PHYSICAL EXAMINATION: ECOG PERFORMANCE STATUS: 0 - Asymptomatic  There were no vitals filed for this visit.  There were no vitals filed for this visit.   GENERAL:alert, no distress and comfortable Breast exam: No concerns. No palpable masses or regional adenopathy No LE edema.   LABORATORY DATA:  I have reviewed the data as listed Lab Results  Component Value Date   WBC 4.3 07/13/2023   HGB 13.5 07/13/2023   HCT 41.4 07/13/2023   MCV 84.1 07/13/2023   PLT 272 07/13/2023   Lab Results  Component Value Date   NA 141 07/13/2023   K 3.6 07/13/2023   CL 104 07/13/2023   CO2 29 07/13/2023    RADIOGRAPHIC STUDIES: I have personally reviewed the radiological reports and agreed with the findings in the report.  ASSESSMENT AND PLAN:   Assessment and Plan Assessment & Plan Breast cancer No evidence of recurrence. Recent mammogram negative.  Contiue anastrozole .  The right sided chest wall pain has improved remarkably.  She is taking a muscle relaxant prescribed by her PCP. No other major concerns  today.  Osteopenia,  Continue weight bearing exercises Continue ca and vit D supplementation  Time spent: 10 min  I connected with  Fidela Shine on 08/12/24 by a telephone application and verified that I am speaking with the correct person using two identifiers.   I discussed the limitations of evaluation  and management by telemedicine. The patient expressed understanding and agreed to proceed.   All questions were answered. The patient knows to call the clinic with any problems, questions or concerns.    Amber Stalls, MD 08/12/24

## 2024-08-13 ENCOUNTER — Emergency Department (HOSPITAL_BASED_OUTPATIENT_CLINIC_OR_DEPARTMENT_OTHER)

## 2024-08-13 ENCOUNTER — Other Ambulatory Visit: Payer: Self-pay

## 2024-08-13 ENCOUNTER — Emergency Department (HOSPITAL_BASED_OUTPATIENT_CLINIC_OR_DEPARTMENT_OTHER)
Admission: EM | Admit: 2024-08-13 | Discharge: 2024-08-13 | Disposition: A | Discharge status: Home / Self Care | Attending: Emergency Medicine | Admitting: Emergency Medicine

## 2024-08-13 ENCOUNTER — Encounter (HOSPITAL_BASED_OUTPATIENT_CLINIC_OR_DEPARTMENT_OTHER): Payer: Self-pay | Admitting: *Deleted

## 2024-08-13 DIAGNOSIS — R001 Bradycardia, unspecified: Secondary | ICD-10-CM | POA: Diagnosis not present

## 2024-08-13 DIAGNOSIS — Z7982 Long term (current) use of aspirin: Secondary | ICD-10-CM | POA: Diagnosis not present

## 2024-08-13 DIAGNOSIS — R519 Headache, unspecified: Secondary | ICD-10-CM | POA: Diagnosis not present

## 2024-08-13 DIAGNOSIS — Z79899 Other long term (current) drug therapy: Secondary | ICD-10-CM | POA: Diagnosis not present

## 2024-08-13 DIAGNOSIS — I6782 Cerebral ischemia: Secondary | ICD-10-CM | POA: Diagnosis not present

## 2024-08-13 DIAGNOSIS — Z853 Personal history of malignant neoplasm of breast: Secondary | ICD-10-CM | POA: Diagnosis not present

## 2024-08-13 DIAGNOSIS — R930 Abnormal findings on diagnostic imaging of skull and head, not elsewhere classified: Secondary | ICD-10-CM | POA: Diagnosis not present

## 2024-08-13 LAB — BASIC METABOLIC PANEL WITH GFR
Anion gap: 12 (ref 5–15)
BUN: 11 mg/dL (ref 8–23)
CO2: 27 mmol/L (ref 22–32)
Calcium: 10.4 mg/dL — ABNORMAL HIGH (ref 8.9–10.3)
Chloride: 98 mmol/L (ref 98–111)
Creatinine, Ser: 0.92 mg/dL (ref 0.44–1.00)
GFR, Estimated: >60 mL/min (ref >60)
Glucose, Bld: 111 mg/dL — ABNORMAL HIGH (ref 70–99)
Potassium: 3.6 mmol/L (ref 3.5–5.1)
Sodium: 137 mmol/L (ref 135–145)

## 2024-08-13 LAB — CBC WITH DIFFERENTIAL/PLATELET
Abs Immature Granulocytes: 0.01 K/uL (ref 0.00–0.07)
Basophils Absolute: 0 K/uL (ref 0.0–0.1)
Basophils Relative: 0 %
Eosinophils Absolute: 0.1 K/uL (ref 0.0–0.5)
Eosinophils Relative: 2 %
HCT: 40.2 % (ref 36.0–46.0)
Hemoglobin: 13.4 g/dL (ref 12.0–15.0)
Immature Granulocytes: 0 %
Lymphocytes Relative: 24 %
Lymphs Abs: 0.9 K/uL (ref 0.7–4.0)
MCH: 27.9 pg (ref 26.0–34.0)
MCHC: 33.3 g/dL (ref 30.0–36.0)
MCV: 83.8 fL (ref 80.0–100.0)
Monocytes Absolute: 0.3 K/uL (ref 0.1–1.0)
Monocytes Relative: 8 %
Neutro Abs: 2.4 K/uL (ref 1.7–7.7)
Neutrophils Relative %: 66 %
Platelets: 249 K/uL (ref 150–400)
RBC: 4.8 MIL/uL (ref 3.87–5.11)
RDW: 14.5 % (ref 11.5–15.5)
WBC: 3.8 K/uL — ABNORMAL LOW (ref 4.0–10.5)
nRBC: 0 % (ref 0.0–0.2)

## 2024-08-13 MED ORDER — KETOROLAC TROMETHAMINE 15 MG/ML IJ SOLN
15.0000 mg | Freq: Once | INTRAMUSCULAR | Status: AC
  Administered 2024-08-13: 15 mg via INTRAVENOUS
  Filled 2024-08-13: qty 1

## 2024-08-13 NOTE — ED Notes (Signed)
 ED Provider at bedside.

## 2024-08-13 NOTE — ED Triage Notes (Signed)
 Pt is here for evaluation of headache x2 weeks.  This headache waxes and weans and is described as throbbing.  No hx of head injury

## 2024-08-13 NOTE — ED Provider Notes (Signed)
 [facility] AT [facility] Provider Note   CSN: [id number] Arrival date & time: 08/13/24  1051     Patient presents with: Headache   Carmen Fischer is a 83 y.o. female.   Patient with history of breast cancer, treated --presents to the emergency department for about 1 week of headache.  Patient reports headache in her bilateral temporal areas that radiates to the top of the head.  She has this daily.  No preceding head injuries.  No upper respiratory or infectious symptoms.  Patient takes aspirin  daily, no anticoagulation.  No vision change or vision loss.  No fevers or confusion.  She drove herself to the emergency department today.  She states that she has an appointment with her doctor tomorrow but did not want to wait. Patient denies signs of stroke including: facial droop, slurred speech, aphasia, weakness/numbness in extremities, imbalance/trouble walking.  She has taken Tylenol  for the pain which has helped it ease off, but pain comes back.  Headache is currently present.  Patient states that she was worried about her heart but denies persistent chest pain, shortness of breath, diaphoresis, vomiting, or other typical ACS symptoms.       Prior to Admission medications   Medication Sig Start Date End Date Taking? Authorizing Provider  acetaminophen  (TYLENOL ) 500 MG tablet Take 500-1,000 mg by mouth every 6 (six) hours as needed (pain.).    [provider]  anastrozole  (ARIMIDEX ) 1 MG tablet Take 1 tablet (1 mg total) by mouth daily. 11/14/23   Iruku, Praveena, MD  aspirin  EC 81 MG tablet Take 81 mg by mouth daily.    [provider]  Bacillus Coagulans-Inulin (METAMUCIL FIBER + PROBIOTICS) CHEW Chew 3 tablets by mouth daily. Patient not taking: Reported on 07/15/2024    [provider]  cholecalciferol (VITAMIN D3) 25 MCG (1000 UNIT) tablet Take 1,000 Units by mouth in the morning.    [provider]  cycloSPORINE  (RESTASIS) 0.05 % ophthalmic emulsion Place 1 drop into both eyes 2 (two) times daily.    [provider]  desloratadine (CLARINEX) 5 MG tablet Take 5 mg by mouth in the morning. 02/13/20   [provider]  hydrochlorothiazide (HYDRODIURIL) 25 MG tablet Take 25 mg by mouth in the morning.    [provider]  hydrocortisone  (ANUSOL -HC) 2.5 % rectal cream Place 1 Application rectally 2 (two) times daily. 09/24/23   Kingsley, Victoria K, DO  KLOR-CON  M20 20 MEQ tablet Take 20 mEq by mouth daily. 02/14/20   [provider]  Magnesium Bisglycinate (MAG GLYCINATE PO) Take 240 mg by mouth at bedtime.    [provider]  magnesium hydroxide (MILK OF MAGNESIA) 400 MG/5ML suspension Take 15 mLs by mouth daily as needed for mild constipation. 09/24/23   Kingsley, Victoria K, DO  Multiple Vitamin (MULTIVITAMIN WITH MINERALS) TABS tablet Take 1 tablet by mouth daily.    [provider]  omeprazole (PRILOSEC) 20 MG capsule Take 20 mg by mouth at bedtime.    [provider]  OVER THE COUNTER MEDICATION Take 1 capsule by mouth in the morning and at bedtime. HydroEye Vitamin (proprietary blend of omega fatty acids (EPA, DHA & GLA)    [provider]  polyethylene glycol (MIRALAX / GLYCOLAX) 17 g packet Take 17 g by mouth daily as needed for moderate constipation.     [provider]  simvastatin  (ZOCOR ) 20 MG tablet Take 20 mg by mouth at bedtime.  [provider]  spironolactone  (ALDACTONE ) 25 MG tablet Take 0.5 tablets (12.5 mg total) by mouth daily. 03/04/20   Maranda Leim DEL, MD  Travoprost , BAK Free, (TRAVATAN ) 0.004 % SOLN ophthalmic solution Place 1 drop into both eyes at bedtime.    [provider]  vitamin E 180 MG (400 UNITS) capsule Take 400 Units by mouth in the morning.    [provider]    Allergies: Shellfish allergy and Prednisone    Review of Systems  Updated Vital Signs BP 131/63 (BP  Location: Right Arm)   Pulse 65   Temp 97.6 F (36.4 C) (Oral)   Resp 16   Ht 5' (1.524 m)   Wt 83 kg   SpO2 99%   BMI 35.74 kg/m   Physical Exam Vitals and nursing note reviewed.  Constitutional:      Appearance: She is well-developed.  HENT:     Head: Normocephalic and atraumatic.     Right Ear: External ear normal.     Left Ear: External ear normal.     Nose: Nose normal.     Mouth/Throat:     Pharynx: Uvula midline.  Eyes:     General: Lids are normal.     Extraocular Movements:     Right eye: No nystagmus.     Left eye: No nystagmus.     Conjunctiva/sclera: Conjunctivae normal.     Pupils: Pupils are equal, round, and reactive to light.  Cardiovascular:     Rate and Rhythm: Normal rate and regular rhythm.  Pulmonary:     Effort: Pulmonary effort is normal.     Breath sounds: Normal breath sounds.  Abdominal:     Palpations: Abdomen is soft.     Tenderness: There is no abdominal tenderness.  Musculoskeletal:     Cervical back: Normal range of motion and neck supple. No tenderness or bony tenderness.  Skin:    General: Skin is warm and dry.  Neurological:     Mental Status: She is alert and oriented to person, place, and time.     GCS: GCS eye subscore is 4. GCS verbal subscore is 5. GCS motor subscore is 6.     Cranial Nerves: No cranial nerve deficit.     Sensory: No sensory deficit.     Motor: No weakness.     Coordination: Coordination normal.     Gait: Gait normal.     Comments: Upper extremity myotomes tested bilaterally:  C5 Shoulder abduction 5/5 C6 Elbow flexion/wrist extension 5/5 C7 Elbow extension 5/5 C8 Finger flexion 5/5 T1 Finger abduction 5/5  Lower extremity myotomes tested bilaterally: L2 Hip flexion 5/5 L3 Knee extension 5/5 L4 Ankle dorsiflexion 5/5 S1 Ankle plantar flexion 5/5      (all labs ordered are listed, but only abnormal results are displayed) Labs Reviewed  CBC WITH DIFFERENTIAL/PLATELET - Abnormal; Notable for the  following components:      Result Value   WBC 3.8 (*)    All other components within normal limits  BASIC METABOLIC PANEL WITH GFR - Abnormal; Notable for the following components:   Glucose, Bld 111 (*)    Calcium  10.4 (*)    All other components within normal limits    EKG: EKG Interpretation Date/Time:  Tuesday August 13 2024 11:52:44 EDT Ventricular Rate:  54 PR Interval:  171 QRS Duration:  90 QT Interval:  435 QTC Calculation: 413 R Axis:   -38  Text Interpretation: Sinus bradycardia Left axis deviation Abnormal  R-wave progression, early transition Confirmed by Jerrol Agent ([id number]) on 08/13/2024 12:29:33 PM  Radiology: CT Head Wo Contrast Result Date: 08/13/2024 EXAM: CT HEAD WITHOUT CONTRAST 08/13/2024 12:29:40 PM TECHNIQUE: CT of the head was performed without the administration of intravenous contrast. Automated exposure control, iterative reconstruction, and/or weight based adjustment of the mA/kV was utilized to reduce the radiation dose to as low as reasonably achievable. COMPARISON: CT head 05/25/2023. MRI head 02/08/2020. CLINICAL HISTORY: New onset headache in patient >= 74 years old. FINDINGS: BRAIN AND VENTRICLES: There is no evidence of an acute infarct, intracranial hemorrhage, mass, midline shift, hydrocephalus, or extra-axial fluid collection. There is mild to moderate cerebral atrophy. An asymmetrically enlarged sulcus in the right parietal region is unchanged. Cerebral white matter hypodensities are similar to the prior CT and nonspecific but compatible with mild chronic small vessel ischemic disease. ORBITS: Bilateral cataract extraction. SINUSES: Clear paranasal sinuses. Trace bilateral mastoid fluid. SOFT TISSUES AND SKULL: No acute soft tissue abnormality. No skull fracture. IMPRESSION: 1. No acute intracranial abnormality. 2. 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 Electronically signed by: Dasie Hamburg MD 08/13/2024 12:37 PM EDT RP  Workstation: [id number]     Procedures   Medications Ordered in the ED  ketorolac  (TORADOL ) 15 MG/ML injection 15 mg (has no administration in time range)   ED Course  Patient seen and examined. History obtained directly from patient.  Reviewed heme-onc note from yesterday, headache not mentioned.  Labs/EKG: Ordered CBC and BMP.  EKG was ordered due to the patient's heart concerns.  No indication for further cardiac workup at this time given lack of chest symptoms.  Imaging: Ordered CT head given atypical headache and patient was 83 years old with history of breast cancer.  Medications/Fluids: Ordered: IV Toradol , no history of chronic kidney disease or GI bleeding.  Most recent vital signs reviewed and are as follows: BP 131/63 (BP Location: Right Arm)   Pulse 65   Temp 97.6 F (36.4 C) (Oral)   Resp 16   Ht 5' (1.524 m)   Wt 83 kg   SpO2 99%   BMI 35.74 kg/m   Initial impression: Headache, not suggestive of temporal arteritis at this time.  No neuro deficits.   1:40 PM Reassessment performed. Patient appears well.  She states that the Toradol  help with the throbbing in her head.  Labs personally reviewed and interpreted including: CBC unremarkable; BMP unremarkable.  Imaging personally visualized and interpreted including: CT head, agree no acute findings, no signs of bleeding.  Stable enlargement right sulcus area.  Reviewed pertinent lab work and imaging with patient at bedside. Questions answered.   Most current vital signs reviewed and are as follows: BP (!) 167/74   Pulse (!) 55   Temp 97.6 F (36.4 C) (Oral)   Resp 12   Ht 5' (1.524 m)   Wt 83 kg   SpO2 100%   BMI 35.74 kg/m   Plan: Discharge to home.   Prescriptions written for: None  Other home care instructions discussed: OTC meds as needed, avoidance of triggers  ED return instructions discussed: Patient counseled to return if they have weakness in their arms or legs, slurred speech, trouble  walking or talking, confusion, trouble with their balance, or if they have any other concerns. Patient verbalizes understanding and agrees with plan.   Follow-up instructions discussed: Patient encouraged to follow-up with their PCP in 3 days. She states that she has an appointment scheduled for tomorrow.  Medical Decision Making Amount and/or Complexity of Data Reviewed Labs: ordered. Radiology: ordered.  Risk Prescription drug management.   In regards to the patient's headache, critical differentials were considered including subarachnoid hemorrhage, intracerebral hemorrhage, epidural/subdural hematoma, pituitary apoplexy, vertebral/carotid artery dissection, giant cell arteritis, central venous thrombosis, reversible cerebral vasoconstriction, acute angle closure glaucoma, idiopathic intracranial hypertension, bacterial meningitis, viral encephalitis, carbon monoxide poisoning, posterior reversible encephalopathy syndrome, pre-eclampsia.   Reg flag symptoms related to these causes were considered including systemic symptoms (fever, weight loss), neurologic symptoms (confusion, mental status change, vision change, associated seizure), acute or sudden thunderclap onset, patient age 12 or older with new or progressive headache, patient of any age with first headache or change in headache pattern, pregnant or postpartum status, history of HIV or other immunocompromise, history of cancer, headache occurring with exertion, associated neck or shoulder pain, associated traumatic injury, concurrent use of anticoagulation, family history of spontaneous SAH, and concurrent drug use.    Other benign, more common causes of headache were considered including migraine, tension-type headache, cluster headache, referred pain from other cause such as sinus infection, dental pain, trigeminal neuralgia.   On exam, patient has a reassuring neuro exam including baseline mental  status, no significant neck pain or meningeal signs, no signs of severe infection or fever.   CT scan was reassuring, no acute findings, stable chronic findings.  EKG without signs of ischemia or arrhythmia.  Patient without chest pain, shortness of breath or other symptoms suggestive of anginal equivalent.  The patient's vital signs, pertinent lab work and imaging were reviewed and interpreted as discussed in the ED course. Hospitalization was considered for further testing, treatments, or serial exams/observation. However as patient is well-appearing, has a stable exam over the course of their evaluation, and reassuring studies today, I do not feel that they warrant admission at this time. This plan was discussed with the patient who verbalizes agreement and comfort with this plan and seems reliable and able to return to the Emergency Department with worsening or changing symptoms.       Final diagnoses:  Acute nonintractable headache, unspecified headache type    ED Discharge Orders     None          Desiderio Chew, PA-C 08/13/24 1342    Jerrol Agent, MD 08/13/24 1450

## 2024-08-13 NOTE — Discharge Instructions (Addendum)
 Please read and follow all provided instructions.  Your diagnoses today include:  1. Acute nonintractable headache, unspecified headache type     Tests performed today include: CT of your head which was normal and did not show any serious cause of your headache Complete blood cell count: Normal white and red blood cells Basic metabolic panel: Normal EKG: Did not show signs of abnormal heart rhythm or irritation Vital signs. See below for your results today.   Medications:  In the Emergency Department you received: Toradol  - NSAID medication similar to ibuprofen  Take any prescribed medications only as directed.  Additional information:  Follow any educational materials contained in this packet.  You are having a headache. No specific cause was found today for your headache. It may have been a migraine or other cause of headache. Stress, anxiety, fatigue, and depression are common triggers for headaches.   Your headache today does not appear to be life-threatening or require hospitalization, but often the exact cause of headaches is not determined in the emergency department. Therefore, follow-up with your doctor is very important to find out what may have caused your headache and whether or not you need any further diagnostic testing or treatment.   Sometimes headaches can appear benign (not harmful), but then more serious symptoms can develop which should prompt an immediate re-evaluation by your doctor or the emergency department.  BE VERY CAREFUL not to take multiple medicines containing Tylenol  (also called acetaminophen ). Doing so can lead to an overdose which can damage your liver and cause liver failure and possibly death.   Follow-up instructions: Please follow-up with your primary care provider in the next 3 days for further evaluation of your symptoms.   Return instructions:  Please return to the Emergency Department if you experience worsening symptoms. Return if the  medications do not resolve your headache, if it recurs, or if you have multiple episodes of vomiting or cannot keep down fluids. Return if you have a change from the usual headache. RETURN IMMEDIATELY IF you: Develop a sudden, severe headache Develop confusion or become poorly responsive or faint Develop a fever above 100.60F or problem breathing Have a change in speech, vision, swallowing, or understanding Develop new weakness, numbness, tingling, incoordination in your arms or legs Have a seizure Please return if you have any other emergent concerns.  Additional Information:  Your vital signs today were: BP (!) 167/74   Pulse (!) 55   Temp 97.6 F (36.4 C) (Oral)   Resp 12   Ht 5' (1.524 m)   Wt 83 kg   SpO2 100%   BMI 35.74 kg/m  If your blood pressure (BP) was elevated above 135/85 this visit, please have this repeated by your doctor within one month. --------------

## 2024-08-19 DIAGNOSIS — Z09 Encounter for follow-up examination after completed treatment for conditions other than malignant neoplasm: Secondary | ICD-10-CM | POA: Diagnosis not present

## 2024-08-19 DIAGNOSIS — H9313 Tinnitus, bilateral: Secondary | ICD-10-CM | POA: Diagnosis not present

## 2024-08-19 DIAGNOSIS — I10 Essential (primary) hypertension: Secondary | ICD-10-CM | POA: Diagnosis not present

## 2024-08-19 DIAGNOSIS — K5900 Constipation, unspecified: Secondary | ICD-10-CM | POA: Diagnosis not present

## 2024-09-12 DIAGNOSIS — M79662 Pain in left lower leg: Secondary | ICD-10-CM | POA: Diagnosis not present

## 2024-10-11 DIAGNOSIS — M79662 Pain in left lower leg: Secondary | ICD-10-CM | POA: Diagnosis not present

## 2024-10-11 DIAGNOSIS — M722 Plantar fascial fibromatosis: Secondary | ICD-10-CM | POA: Diagnosis not present

## 2024-10-31 ENCOUNTER — Other Ambulatory Visit: Payer: Self-pay | Admitting: Hematology and Oncology

## 2025-01-10 ENCOUNTER — Ambulatory Visit: Admitting: Hematology and Oncology

## 2025-02-04 ENCOUNTER — Inpatient Hospital Stay: Admitting: Hematology and Oncology
